# Patient Record
Sex: Male | Born: 1950 | Race: White | Hispanic: No | Marital: Married
Health system: Midwestern US, Academic
[De-identification: ages and names within clinical notes are randomized; demographics above are authoritative.]

---

## 2019-01-31 ENCOUNTER — Encounter: Admit: 2019-01-31 | Discharge: 2019-01-31

## 2019-01-31 ENCOUNTER — Encounter
Admit: 2019-01-31 | Discharge: 2019-02-04 | Disposition: A | Discharge status: Rehabilitation Facility | Source: Other Acute Inpatient Hospital | Attending: Neurology | Admitting: Neurology

## 2019-01-31 DIAGNOSIS — G459 Transient cerebral ischemic attack, unspecified: Principal | ICD-10-CM

## 2019-01-31 MED ORDER — CLOPIDOGREL 75 MG PO TAB
75 mg | Freq: Every day | ORAL | 0 refills | Status: DC
Start: 2019-01-31 — End: 2019-02-04
  Administered 2019-02-01 – 2019-02-04 (×5): 75 mg via ORAL

## 2019-01-31 MED ORDER — INSULIN ASPART 100 UNIT/ML SC FLEXPEN
0-6 [IU] | Freq: Before meals | SUBCUTANEOUS | 0 refills | Status: DC
Start: 2019-01-31 — End: 2019-02-04
  Administered 2019-02-01: 18:00:00 2 [IU] via SUBCUTANEOUS

## 2019-01-31 MED ORDER — INSULIN GLARGINE 100 UNIT/ML (3 ML) SC INJ PEN
10 [IU] | Freq: Every evening | SUBCUTANEOUS | 0 refills | Status: DC
Start: 2019-01-31 — End: 2019-02-04
  Administered 2019-02-01: 03:00:00 10 [IU] via SUBCUTANEOUS

## 2019-01-31 MED ORDER — TAMSULOSIN 0.4 MG PO CAP
0.4 mg | Freq: Every day | ORAL | 0 refills | Status: DC
Start: 2019-01-31 — End: 2019-02-01

## 2019-01-31 MED ORDER — ATORVASTATIN 40 MG PO TAB
80 mg | Freq: Every evening | ORAL | 0 refills | Status: DC
Start: 2019-01-31 — End: 2019-02-04
  Administered 2019-02-01 – 2019-02-04 (×4): 80 mg via ORAL

## 2019-01-31 MED ORDER — ASPIRIN 81 MG PO CHEW
81 mg | Freq: Every day | ORAL | 0 refills | Status: DC
Start: 2019-01-31 — End: 2019-02-04
  Administered 2019-02-01 – 2019-02-04 (×4): 81 mg via ORAL

## 2019-01-31 MED ORDER — HEPARIN, PORCINE (PF) 5,000 UNIT/0.5 ML IJ SYRG
5000 [IU] | SUBCUTANEOUS | 0 refills | Status: DC
Start: 2019-01-31 — End: 2019-02-04
  Administered 2019-02-01 – 2019-02-04 (×11): 5000 [IU] via SUBCUTANEOUS

## 2019-01-31 MED ORDER — CITALOPRAM 20 MG PO TAB
20 mg | Freq: Every day | ORAL | 0 refills | Status: DC
Start: 2019-01-31 — End: 2019-02-04
  Administered 2019-02-01 – 2019-02-04 (×4): 20 mg via ORAL

## 2019-01-31 MED ORDER — PANTOPRAZOLE 20 MG PO TBEC
20 mg | Freq: Every day | ORAL | 0 refills | Status: DC
Start: 2019-01-31 — End: 2019-02-04
  Administered 2019-02-01 – 2019-02-04 (×4): 20 mg via ORAL

## 2019-01-31 NOTE — Progress Notes
Patient arrived to room # [id number]) via bed accompanied by transport. Patient transferred to the bed with assistance. Bedside safety checks completed. Initial patient assessment completed. Refer to flowsheet for details.    Admission skin assessment completed with: Delfino Lovett, RN    Pressure injury present on arrival?: Yes    1. Head/Face/Neck: No  2. Trunk/Back: No  3. Upper Extremities: No  4. Lower Extremities: Yes  5. Pelvic/Coccyx: No  6. Assessed for device associated injury? Yes  7. Malnutrition Screening Tool (Nursing Nutrition Assessment) Completed? Yes    See Doc Flowsheet for additional wound details.     INTERVENTIONS: Pt has possibly two stage II's on R heel and R malleolus, skin abrasion R elbow area, and bruises all over body. Will consult wound team.

## 2019-02-01 ENCOUNTER — Encounter: Admit: 2019-02-01 | Discharge: 2019-02-01 | Attending: Neurology | Admitting: Neurology

## 2019-02-01 ENCOUNTER — Encounter: Admit: 2019-02-01 | Discharge: 2019-02-01

## 2019-02-01 DIAGNOSIS — G459 Transient cerebral ischemic attack, unspecified: Principal | ICD-10-CM

## 2019-02-01 LAB — POC GLUCOSE
Lab: 81 mg/dL — ABNORMAL HIGH (ref 70–100)
Lab: 176 mg/dL — ABNORMAL HIGH (ref 70–100)
Lab: 255 mg/dL — ABNORMAL HIGH (ref 70–100)
Lab: 225 mg/dL — ABNORMAL HIGH (ref 70–100)

## 2019-02-01 LAB — CBC
Lab: 12 g/dL — ABNORMAL LOW (ref 13.5–16.5)
Lab: 121 10*3/uL — ABNORMAL LOW (ref 150–400)
Lab: 15 % — ABNORMAL HIGH (ref 11–15)
Lab: 3.9 M/UL — ABNORMAL LOW (ref 4.4–5.5)
Lab: 30 pg (ref 26–34)
Lab: 34 g/dL (ref 32.0–36.0)
Lab: 35 % — ABNORMAL LOW (ref 40–50)
Lab: 4.1 M/UL — ABNORMAL LOW (ref 4.4–5.5)
Lab: 5.1 10*3/uL (ref 4.5–11.0)
Lab: 5.6 10*3/uL (ref 4.5–11.0)
Lab: 8.6 FL (ref 7–11)
Lab: 89 FL (ref 80–100)

## 2019-02-01 LAB — LIPID PROFILE
Lab: 124 mg/dL — ABNORMAL HIGH (ref <100)
Lab: 133 mg/dL (ref 7–11)
Lab: 14 mg/dL — ABNORMAL LOW (ref >60)
Lab: 193 mg/dL (ref <200)
Lab: 60 mg/dL (ref >40)
Lab: 70 mg/dL (ref <150)

## 2019-02-01 LAB — BASIC METABOLIC PANEL
Lab: 0.7 mg/dL (ref 0.4–1.24)
Lab: 107 MMOL/L (ref 98–110)
Lab: 141 MMOL/L (ref 137–147)
Lab: 17 mg/dL (ref 7–25)
Lab: 25 MMOL/L (ref 21–30)
Lab: 3.7 MMOL/L (ref 3.5–5.1)
Lab: 8.8 mg/dL (ref 8.5–10.6)
Lab: 83 mg/dL (ref 70–100)
Lab: >60 mL/min (ref >60)
Lab: >60 mL/min (ref >60)
Lab: 9 (ref 3–12)
Lab: 136 MMOL/L — ABNORMAL LOW (ref 137–147)
Lab: 3.8 MMOL/L (ref 3.5–5.1)

## 2019-02-01 LAB — COVID-19 (SARS-COV-2) PCR

## 2019-02-01 LAB — VITAMIN B12: Lab: 104 pg/mL — ABNORMAL HIGH (ref 180–914)

## 2019-02-01 LAB — HEMOGLOBIN A1C: Lab: 6.8 % — ABNORMAL HIGH (ref 4.0–6.0)

## 2019-02-01 MED ORDER — PERFLUTREN LIPID MICROSPHERES 1.1 MG/ML IV SUSP
1-20 mL | Freq: Once | INTRAVENOUS | 0 refills | Status: CP | PRN
Start: 2019-02-01 — End: ?
  Administered 2019-02-01: 13:00:00 2 mL via INTRAVENOUS

## 2019-02-01 MED ORDER — VITS A AND D-WHITE PET-LANOLIN TP OINT
Freq: Two times a day (BID) | TOPICAL | 0 refills | Status: DC
Start: 2019-02-01 — End: 2019-02-04
  Administered 2019-02-01: via TOPICAL

## 2019-02-01 MED ORDER — LACTATED RINGERS IV SOLP
1000 mL | Freq: Once | INTRAVENOUS | 0 refills | Status: CP
Start: 2019-02-01 — End: ?
  Administered 2019-02-01: 20:00:00 1000 mL via INTRAVENOUS

## 2019-02-01 MED ORDER — SODIUM CHLORIDE 0.9 % IJ SOLN
50 mL | Freq: Once | INTRAVENOUS | 0 refills | Status: CP
Start: 2019-02-01 — End: ?
  Administered 2019-02-01: 10:00:00 50 mL via INTRAVENOUS

## 2019-02-01 MED ORDER — LOPERAMIDE 2 MG PO CAP
2 mg | Freq: Every day | ORAL | 0 refills | Status: DC | PRN
Start: 2019-02-01 — End: 2019-02-04
  Administered 2019-02-01: 08:00:00 2 mg via ORAL

## 2019-02-01 MED ORDER — IOHEXOL 350 MG IODINE/ML IV SOLN
60 mL | Freq: Once | INTRAVENOUS | 0 refills | Status: CP
Start: 2019-02-01 — End: ?
  Administered 2019-02-01: 10:00:00 60 mL via INTRAVENOUS

## 2019-02-01 NOTE — Progress Notes
PHYSICAL THERAPY  ASSESSMENT      Name: Mitchell Lucero        MRN: [id number]          DOB: [birth year]          Age: 68 y.o.  Admission Date: 01/31/2019             LOS: 1 day      Mobility  Patient Turn/Position: Chair  Progressive Mobility Level: Walk in hallway  Distance Walked (feet): 150 ft(2 bouts of 75 feet apiece with one seated rest break)  Level of Assistance: Assist X1  Assistive Device: Walker(Plus chair follow)  Time Tolerated: 11-30 minutes  Activity Limited By: Weakness    Subjective  Significant hospital events: 68 y.o male who transferred to [facility] for a transient episode of dysarthria associated with right arm large amplitude irregular involuntary movement and both leg shaking with retained awareness. Pt has been having increasing number of falls at home. 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  Mental / Cognitive Status: Alert;Oriented;Cooperative  Persons Present: Spouse;Occupational Therapist  Pain: Patient has no complaint of pain  Pain Interventions: Patient agrees to participate in therapy  Ambulation Assist: Assist Needed with Mobility-Related ADL's/Ambulation  Patient Owned Equipment: Nurse, adult  Home Situation: Lives with Family(Spouse and mother)  Type of Home: House(Split-level)  Entry Stairs: Stair Glide  In-Home Stairs: Stair Glide  Comments: Patient has been receiving home health therapies at home prior to this admission. Experienced a pontine stroke in early June of this year, but did not go to inpatient setting afterwards and discharged home with home therapies. Since returning home, patient has been experiencing multiple falls per day. Spouse initially reports she can remember at least 13 falls, but later reports that the patient's mother says the patient seems to fall at least twice per day. Patient reports no warning signs to these falls (dizziness, LE shaking, etc).     ROM  ROM Position Assessed: Seated LE ROM: (Bilateral knee extension lacking 5-10 degrees)    Strength  Strength Position Assessed: Seated  Overall Strength: Able to Move All Joints Independently Through Available ROM;Generalized Weakness    Posture/Neurological  Head Control: Independent  Posture: Rounded Shoulders  Overall Tone: Normal  Coordination: No Deficits Noted  Posture/Neuro Comments: Patient reports history of peripheral neuropathy in his feet, but otherwise endorses no numbness/tingling in UEs/LEs    Bed Mobility/Transfer  Bed Mobility: Supine to Sit: Minimal Assist;Head of Bed Elevated;Assist with Trunk  Transfer Type: Sit to Stand  Transfer: Assistance Level: From;Bed;Minimal Assist;of 1st person  Transfer: Assistive Device: Nurse, adult  Transfers: Type Of Assistance: For Safety Considerations;For Strength Deficit;For Balance;Verbal Cues  Other Transfer Type: Sit to/from Stand  Other Transfer: Assistance Level: To/From;Bed Side Chair;Minimal Assist  Other Transfer: Assistive Device: Nurse, adult  Other Transfer: Type Of Assistance: For Safety Considerations;For Strength Deficit;For Balance;Verbal Cues  End Of Activity Status: Up in Chair;Nursing Notified;Instructed Patient to Request Assist with Mobility;Instructed Patient to Use Call Light    Gait  Gait Distance: 150 feet(2 bouts of 75 feet apiece)  Gait: Assistance Level: Minimal Assist;of 1st person(Chair follow of 2nd person)  Gait: Assistive Device: Roller Walker  Gait: Descriptors: Variable step length;No balance loss  Activity Limited By: Weakness    Education  Persons Educated: Patient/Family  Patient Barriers To Learning: None Noted  Interventions: Repetition of Instructions;Demonstration Provided  Teaching Methods: Verbal Instruction;Demonstration  Patient Response: Verbalized Understanding;More Instruction  Required  Topics: Plan/Goals of PT Interventions;Use of Assistive Device/Orthosis;Mobility Progression;Safety Awareness;Up with Assist Only;Importance of Increasing Activity;Recommend Continued Therapy    Assessment/Progress  Impaired Mobility Due To: Decreased Strength;Impaired Balance;Decreased Activity Tolerance;Safety Concerns  Assessment/Progress: Should Improve w/ Continued PT    AM-PAC 6 Clicks Basic Mobility Inpatient  Turning from your back to your side while in a flat bed without using bed rails: A Little  Moving from lying on your back to sitting on the side of a flatbed without using bedrails : A Little  Moving to and from a bed to a chair (including a wheelchair): A Little  Standing up from a chair using your arms (e.g. wheelchair, or bedside chair): A Little  To walk in hospital room: A Little  Climbing 3-5 steps with a railing: Total  Raw Score: 16  Standardized (T-scale) Score: 38.32  Basic Mobility CMS 0-100%: 47.12  CMS G Code Modifier for Basic Mobility: CK    Goals  Goal Formulation: With Patient/Family  Time For Goal Achievement: 5 days, To, 7 days  Patient Will Go Supine To/From Sit: w/ Stand By Assist  Patient Will Transfer Bed/Chair: w/ Stand By Assist  Patient Will Transfer Sit to Stand: w/ Stand By Assist  Patient Will Ambulate: 51-100 Feet, w/ Dan Humphreys, w/ Stand By Assist    Plan  Treatment Interventions: Mobility Training;Strengthening  Plan Frequency: 5 Days per Week  PT Plan for Next Visit: Progress proximal LE strengthening and consider 5x sit<>stand as standardized outcome for assessing. Progress gait distance with roller walker as able.     PT Discharge Recommendations  Recommendation: Inpatient setting;Recommend rehab medicine consult  Patient Currently Requires Physical Assist With: All mobility    Therapist  Edward Jolly, PT, DOT [phone]  Date  02/01/2019

## 2019-02-01 NOTE — Progress Notes
OCCUPATIONAL THERAPY  ASSESSMENT NOTE      Name: Mitchell Lucero        MRN: [id number]          DOB: 1951/02/01          Age: 68 y.o.  Admission Date: 01/31/2019             LOS: 1 day      Mobility  Patient Turn/Position: Chair  Progressive Mobility Level: Walk in hallway  Distance Walked (feet): 150 ft  Level of Assistance: Assist X1  Assistive Device: Walker(Chair follow)  Time Tolerated: 11-30 minutes  Activity Limited By: Fatigue;Weakness    Subjective  Pertinent Dx per Physician: 68 y.o male who transferred to [facility] for a transient episode of dysarthria associated with right arm large amplitude irregular involuntary movement and both leg shaking with retained awareness. Pt has been having increasing number of falls at home. 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  Precautions: Falls  Pain / Complaints: Patient agrees to participate in therapy;Patient has no c/o pain    Objective  Psychosocial Status: Willing and Cooperative to Participate  Persons Present: Spouse;Physical Therapist    Home Living  Type of Home: House(Split Level)  Home Layout: Two Level;Bed/Bath IT sales professional / Tub: Pension scheme manager: Standard  Bathroom Equipment: Engineer, materials in Air traffic controller;Shower Chair  Home Equipment: Walker(2 - one upstairs w/ tray & one downstairs;Stair glide)    Prior Function  Level Of Independence: Independent with ADLs and functional transfers  Lives With: Spouse;Mother  Receives Help From: Family    ADL's  Where Assessed: Edge of Bed;Chair  LE Dressing Assist: Maximum Assist  LE Dressing Deficits: Don/Doff R Sock;Don/Doff L Sock    ADL Mobility  Bed Mobility: Supine to Sit: Minimal assist  Bed Mobility Comments: HOB elevated;bed rail;hand hold assist  Transfer Type: Sit to/from stand  Transfer: Assistance Level: Minimal assist;of 1st person;Standby assist;of 2nd person;From;Bed  Transfer: Assistive Device: Roller walker Transfer: Type of Assistance: For safety considerations;For balance;For strength deficit  End of Activity Status: Up in chair;Instructed patient to request assist with mobility(TABS alarm on)  Gait Distance: 150 feet  Gait: Assistance Level: Minimal assist  Gait: Assistive Device: Roller walker  Gait Comments: Chair follow for safety during ambulation due to frequent falls.    Activity Tolerance  Endurance: 3/5 Tolerates 25-30 Minutes Exercise w/Multiple Rests    Cognition  Overall Cognitive Status: Impaired  Expression: Increased Time for Expression;Mumbling/Slurred(Per spouse, he is at his new baseline since prior stroke)  Social Interaction: Flat affect  Problem Solving: Decreased Judgment/Safety  Orientation: Alert & Oriented x4  Attention: Awake/Alert    UE AROM  Overall BUE AROM WNL: Yes  Grasp: Bilateral Grasp Functional for Activity    UE Strength / Tone  Overall Strength / Tone: WNL BUE Strength 5/5    Education  Persons Educated: Patient/Family  Teaching Methods: Verbal Instruction  Patient Response: Verbalized Understanding  Topics: Role of OT, Goals for Therapy  Goal Formulation: With Patient    Assessment  Assessment: Decreased ADL Status;Decreased Safe/Judg during ADL;Decreased Cognition;Decreased Endurance;Decreased Self-Care Trans;Decreased High-Level ADLs  Prognosis: Good;w/Cont OT s/p Acute Discharge    Plan  OT Frequency: 5x/week  OT Plan for Next Visit: Standing at sink for grooming; LE dressing at EOB    ADL Goals  Patient Will Perform All ADL's: w/ Stand By Assist;w/Adaptive Equipment;w/ Good Judgment/Safety    Functional Transfer  Goals  Pt Will Perform All Functional Transfers: w/ Stand By Assist, w/ Assistive Devices, w/ Good Judgment/Safety    OT Discharge Recommendations  Recommendation: Inpatient setting;Recommend rehab medicine consult  Patient Currently Requires Physical Assist With: All mobility;All personal care ADLs    Therapist: Lilian Coma, OTR/L [id number]  Date: 02/01/2019

## 2019-02-01 NOTE — H&P (View-Only)
Neurology Admission History and Physical      Name:  Mitchell Lucero                                             MRN:  [id number]   Admission Date:  01/31/2019  LOS: 0 days  CA6107/01                 ASSESSMENT:    Principal Problem:    TIA (transient ischemic attack)      Mitchell Lucero is a 68 y.o. R handed male with PMH of HTN, DM2, C6-C7 ADCF in 1997, and recent left pontine stroke in 12/18/2018 who was transferred to [facility] for a transient episode of dysarthria for 1.5 hrs in the morning of 01/31/2019 associated with right arm large amplitude irregular involuntary movement and both leg shaking with retained awareness. He has been having increasing number of falls since he was discharged from rehab, primarily at night when its dark, even with walker. Unintentional weight loss of 30 lbs over the past year.    Neuro exam: AAO x3 not fully to time, Impaired repetition, No tongue atrophy/fasciculation/weakness, SA 4/4, EF and EE 4/5, FF/FE/FA/TA 3-4/4, with visible FDI atrophy bilaterally with fasciculation in the right FDI; diffusely hyperreflexic with jaw reflex, right hoffman, left grasp reflex, bilateral babinski signs, slow finger tapping bilaterally and clumsy heel shin on the left leg. Unable to walk for me; stood up 10 seconds with help, wide based, and immediately went back to bed.    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  - needs current and previous MRIs   Plan  - Admit to neurology on telemetry   - obtain records and imaging from [facility]  - MRI head and C-spine wo  - CTA head/neck  - repeat orthostatic vitals - Stroke risk factor assessment                Labs to include FLP, A1c               Echocardiogram                Vascular Imaging CTA head/neck               Telemetry to monitor for arrhythmia                Antiplt therapy: continue PTA aspirin and plavix   - CBC, BMP routine   - If above imaging is unrevealing, consider EMG/NCS and movement disorder consult  - PT/OT/SLP consult eval and treat   - Rehab consult    Increased number of falls with intermittent LOC without following confusion  - will monitor on tele, consider cardiac monitor at disciharge  - echo cardiogram  - PT/OT, rehab consult    Burning pain and hypersensitivity in soles bilaterally  Decreased proprioception on the right toe  Decreased vibratory sense on the left toe  - HbA1c, IFES, vitamin B12    Orthostatic hypotension  at OSH  - recheck orthostatic vitals  - hold Lisinopril and tamsulosin    DM2   - PTA Levemir 10 unites QHS and metformin 500 mg BID  > hold metformin, lantus 10 units QHS, LDCF    FEN: No IVF, replete as needed, regular diet  VTE ppx: heparin SQ  CODE: FULL CODE    Dispo: Admit to Neurology for workup    Patient seen and plan of care discussed with Dr. Georgina Pillion, and to be seen by the day team.  __________________________________________________________________________________    History of Present Illness:   Mitchell Lucero is a 68 y.o. R handed male with PMH of HTN, DM2, C6-C7 ADCF in 1997, and recent left pontine stroke in 12/18/2018 who was transferred to [facility] for a transient episode of dysarthria for 1.5 hrs in the morning of 01/31/2019 associated with right arm large amplitude irregular involuntary movement and both leg shaking with retained awareness.    This morning, he was witnessed to walk normally to the bathroom, and got out, and next time his wife saw him he was barely sitting at the edge of the couch, with slurred speech and large amplitude irregular involuntary movement with the right arm and shakiness in both legs.  He retained awareness during this.  Slurred speech lasted about 1.5 hour.  He had similar episodes on 7/17 night.  Blood sugar was 130 and blood pressure was 106/50s.  He had a pontine stroke in June 6 that was treated at [facility], [facility].  He had dysarthria with this stroke 2.  After he was discharged from a rehab, he had more than 13 falls even using the walker, primarily at night.  He does not note morning sensation, however he notes intermittent loss of consciousness with the fall with spontaneous recovery of awareness after he is down.  There is no confusion after loss of consciousness.  It is not always right after standing up, there is no warning signs such as chest pain or racing heart or shortness of breath.  Recently, he now has a urinal at bedside to avoid walking to the bathroom at night.    He has longstanding tremors when he tries to eat or do something with his hands, seen by 2 different neurologist and was told that low concern for Parkinson's disease.  He does not have resting tremor.    Unintentional weight loss of 30 lbs over the past year.  He has remote history of cervical spine surgery in 1992 with residual RUE weakness.  History was primarily obtained by his wife and it appeared that patient himself needed some help to recall some details.    No complaints of fever, chills, headache, vision changes, chest pain, dyspnea, muscle weakness, sensory deficits, nausea, vomiting.    No past medical history on file. HTN, DM2  No past surgical history on file. ADCF C6-C7 in 1997  No smoking, occasional drink, no recreational drung  Social History     Socioeconomic History   ??? Marital status: Not on file     Spouse name: Not on file   ??? Number of children: Not on file   ??? Years of education: Not on file   ??? Highest education level: Not on file   Occupational History   ??? Not on file   Tobacco Use   ??? Smoking status: Not on file Substance and Sexual Activity   ??? Alcohol use: Not on file   ??? Drug use: Not on file   ???  Sexual activity: Not on file   Other Topics Concern   ??? Not on file   Social History Narrative   ??? Not on file      No family history on file. - family history reviewed, noncontributory      Immunizations (includes history and patient reported):   There is no immunization history on file for this patient.        Allergies:  Patient has no allergy information on record.    Medications:  No medications prior to admission.     No current facility-administered medications on file prior to encounter.      No current outpatient medications on file prior to encounter.       Review of Systems:  Constitutional: negative   Eyes: negative   Ears, nose, mouth, throat, and face: negative   Respiratory: negative   Cardiovascular: negative   Gastrointestinal: negative   Genitourinary: negative   Integument/breast: negative   Hematologic/lymphatic: negative   Musculoskeletal: negative   Neurological: negative   Behavioral/Psych: negative   Endocrine: negative   Allergic/Immunologic: negative    Physical Exam:  Vital Signs: Last Filed In 24 Hours Vital Signs: 24 Hour Range   BP: 124/71 (07/20 2153)  Temp: 36.6 ???C (97.9 ???F) (07/20 2153)  Pulse: 73 (07/20 2153)  Respirations: 15 PER MINUTE (07/20 2153)  SpO2: 97 % (07/20 2153)  Height: 160 cm (63) (07/20 1818) BP: (112-124)/(48-71)   Temp:  [36.6 ???C (97.9 ???F)-36.8 ???C (98.2 ???F)]   Pulse:  [73-78]   Respirations:  [15 PER MINUTE-16 PER MINUTE]   SpO2:  [97 %-98 %]           HEENT: normocephalic, eyes open with no discharge, nares patent, oropharynx is clear with no lesions, palate intact, no bruits  Chest: normal configuration, non labored breathing, chest rise equal b/l   CV: normal rate and regular rhythm, no murmur, distal pulses palpable  Pulmonary: lungs are clear bilaterally, no wheezes/crackles/rales  Abd: soft, non- tender, no masses, no organomegaly  Skin: no rashes or lesions Psych: normal     Neuro:  Mental status: Oriented to person/place, not fully to time (month and year, not to the date, correct day of the week  Memory: Recent and remote memory intact  Attention: Good span, follows conversation.  Fund of knowledge: Appropriate  Level of consciousness: Alert    Speech:    Fluency: Normal   Comprehension: Normal   Articulation: Normal   Repetition: impaired; able to repeat today is a bright sunny day, but not no ifs, ands, or buts, repetitively saying no ifs and buts.   Naming: Normal    CN II-XII: Visual fields intact in all fields, PERRL, EOMI, facial sensation intact. Symmetrical facial movement. Hearing grossly intact to conversation. Strong cough, elevates palate b/l, uvula midline. Strong shoulder shrug. Tongue midline.    Motor: Increased tone in LUE. R FDI fasciculation, b/l FDI atrophy   NF NE SA  EF  EE  WE  WF  FF  FE  FA  TA  HF  HA  HE  KF  KE  DF  PF  In  Ev  TF  TE    R     4 4 4 5  5  4 4 4 3 4   5  5  5  5         L    4 5  5  5  5  4 4 4 3  4  5  5  5  5           Sensory:   Light touch: intact, without sensory level   Pin prick: Diminished in R foot   Vibration (tuning fork 128 Hz):GT 12 sec in the right, 8 sec on the left   Proprioception: not intact in the right toe    Reflexes: No clonus, hofmann +/-. Babinski bilaterally upgoing. Palmomental (+) in the right hand. Jaw jerk+.   Right Left   Triceps 2+ 2+   Biceps 2+ 2+   Brachioradialis 2+ 2+   Patella 2+ 2+   Ankle 2 2     Cerebellar Funciton/fine movement: normal finger to nose, clumsy left heel shin, bradykinesia with finger tapping bilatearlly.  Gait: Only able to stand up assisted, needed to go back to bed after 10 seconds, unable to stand up feet together    Lab/Radiology/Other Diagnostic Tests:  24-hour labs:    Results for orders placed or performed during the hospital encounter of 01/31/19 (from the past 24 hour(s))   CBC    Collection Time: 01/31/19  6:50 PM   Result Value Ref Range White Blood Cells 5.6 4.5 - 11.0 K/UL    RBC 3.99 (L) 4.4 - 5.5 M/UL    Hemoglobin 12.1 (L) 13.5 - 16.5 GM/DL    Hematocrit 16.1 (L) 40 - 50 %    MCV 89.0 80 - 100 FL    MCH 30.3 26 - 34 PG    MCHC 34.0 32.0 - 36.0 G/DL    RDW 09.6 (H) 11 - 15 %    Platelet Count 121 (L) 150 - 400 K/UL    MPV 8.6 7 - 11 FL          Pertinent radiology reviewed.    Para Skeans, MD  Neurology Resident  Pager [phone]

## 2019-02-01 NOTE — Consults
Wound Ostomy Note    NAME:Mitchell Lucero                                                                   MRN: [id number]                 DOB:[birth year]          AGE: 68 y.o.  ADMISSION DATE: 01/31/2019             DAYS ADMITTED: LOS: 1 day      Reason for Consult/Visit: pressure injury Stage II or greater and wound not pressure    Assessment/Plan:    Principal Problem:    TIA (transient ischemic attack)          Wounds (NOT for Pressure Injuries) 02/01/19 1030 Dorsal;Left Foot Laceration (Active)   02/01/19 1030 Foot   Wound Orientation: Dorsal;Left   Wound Type: Laceration   Wound Type::    Wound Description (Comments):    Wound Image   02/01/2019 10:30 AM   Wound Base Assessment Dry;Red 02/01/2019 10:30 AM   Surrounding Skin Assessment Dry;Intact 02/01/2019 10:30 AM   Wound Site Closure None 02/01/2019 10:30 AM   Wound Drainage Amount None 02/01/2019 10:30 AM   Wound Length (cm) 0.3 cm 02/01/2019 10:30 AM   Wound Width (cm) 1 cm 02/01/2019 10:30 AM   Wound Depth (cm) 0.1 cm 02/01/2019 10:30 AM   Wound Surface Area (cm^2) 0.3 cm^2 02/01/2019 10:30 AM   Wound Volume (cm^3) 0.03 cm^3 02/01/2019 10:30 AM   Number of days: 0     Pressure Injury 01/31/19 1844 Yes Posterior Ankle Stage 2 (Active)   01/31/19 1844    Pressure Injury Present On Inpatient Admission: Y   Pressure Injury Orientation: Posterior   Wound Location: Ankle   Pressure Injury Stages: Stage 2   If this pressure injury is suspected to be device related, please select the device::          Wound Dressing Status Open to Air 02/01/2019  8:17 AM   Wound Drainage Amount None 02/01/2019 10:30 AM   Wound Base Assessment Pink;Red 02/01/2019 10:30 AM   Surrounding Skin Assessment Dry;Intact 02/01/2019 10:30 AM   Wound Length (cm) 0.8 cm 02/01/2019 10:30 AM   Wound Width (cm) 0.8 cm 02/01/2019 10:30 AM   Wound Depth (cm) 0.1 cm 02/01/2019 10:30 AM   Wound Surface Area (cm^2) 0.64 cm^2 02/01/2019 10:30 AM   Wound Volume (cm^3) 0.06 cm^3 02/01/2019 10:30 AM Patient states lacerations on bilateral feet are from cat scratches. Left foot with Dry red scabbed wound base.     Right posterior ankle with dry red wound base.     RECOMMEND: ---Wound care and maintenance orders placed per skin integrity protocol.  If in agreement, primary team is responsible for placing any orders for consults, tests and/or procedures.---    Bilateral Feet:  Clean with soap and water. Dry.   Vit A&D BID    Wound Team will sign off. Please re-consult if skin breakdown worsens.     Bettey Mare RN, BSN  Wound/ Ostomy Nursing Consult Service  Office: [phone]  Pager: [phone]  Wound/ Ostomy Team pager (after hours/ weekends): [phone]

## 2019-02-01 NOTE — Progress Notes
Neurology Progress Note      Mitchell Lucero  Admission Date: 01/31/2019  LOS: 1 day     Assessment/Plan:  Principal Problem:    TIA (transient ischemic attack)    Mitchell Lucero is a 68 y.o. R handed male with PMH of HTN, DM2, C6-C7 ADCF in 1997, and recent left pontine stroke in 12/18/2018 who was transferred to [facility] for a transient episode of dysarthria for 1.5 hrs in the morning of 01/31/2019 associated with right arm large amplitude irregular involuntary movement and both leg shaking with retained awareness. He has been having increasing number of falls since he was discharged from rehab, primarily at night when its dark, even with walker. Unintentional weight loss of 30 lbs over the past year.  ???  Admission Neuro exam: AAO x3 not fully to time, Impaired repetition, No tongue atrophy/fasciculation/weakness, SA 4/4, EF and EE 4/5, FF/FE/FA/TA 3-4/4, with visible FDI atrophy bilaterally with fasciculation in the right FDI; diffusely hyperreflexic with jaw reflex, right hoffman, left grasp reflex, bilateral babinski signs, slow finger tapping bilaterally and clumsy heel shin on the left leg.    Recent pontine stroke with transient slurred speech  Transient episode of dysarthria and involuntary movement in the right arm, and both leg shaking.  Recurrent falls  -DDX:  Differential is broad at this point , his multiple exam findings could be explained by different Disease processes including his previous pontine stroke with involvement of  Middle cerebellar peduncle ( explains left sided Dysmetria and ataxia). Current Symptoms of Right arm Shaking with Dysarthria Likely represent TIA ( Limb shaking TIA ) given his extensive intracranial atherosclerosis . His symptoms usually happen when he stand up ( with decreased brain blood supply in the setting of orthostatsis). 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   - Degenerative Disorders is still in the differential given the orthostatic hypotension , gait issues and abnormal movements.  - MRI brain: with Signal abnormality without atrophy involving the left aspect of the  pons and left middle cerebellar peduncle corresponding with his recent pontine infarct.lack of significant atrophy somewhat goes against brainstem/cerebellar neurodegenerative diseases.  - MRI C- Spine: ???Prior ACDF at C6-C7 without high-grade central or neural foraminal   stenosis at the operated level.Multilevel central spinal stenosis, most pronounced of suspected at   least moderate degree at C4-C5.Multilevel degenerative neural foraminal narrowing.  - CTA brain Dense cavernous carotid calcification, ???Short segment occlusion or high-grade stenosis of the right P2 arterial segment, ???Dense atherosclerotic calcification within the V4 segment segments.  (moderate or high grade stenosis) , Mild stenosis of the left M1 origin.  - CTA neck : ???Mixed, predominantly soft plaque atherosclerosis of the right ICA   origin resulting in less than 50% stenosis by NASCET criteria. No significant left carotid stenosis by NASCET criteria.???Atherosclerotic calcification at the right vertebral artery origin resulting in at least mild stenosis.  - A1C 6.8 , Lipid Panel with LDL of 124 .   - 2-D echo with EF of 55% , Grade 1 LV diastolic dysfunction. Moderately Dilated Left atrium. Mild aortic stenosis. No evidence of intraatrial shunting.  Plan  - Optimization of risk factors for secondary prevention of stroke:   > Continue DAPT Aspirin  81 mg daily , Plavix 75 mg daily.   > Continue High intensity statin Atorvastatin 80 mg QHS . Goal LDL < 70.   > Control orthostatic hypotension to prevent  cerebral hypoperfusion in the setting if intracranial atherosclerosis.  - Will obtain EEG to rule out seizures. - Follow up with movement disorder on discharge to rule out Neurodegenerative process like MSA.  - Continue to monitor on Tele.    Orthostatic Hypotension   - Patient reported Dizziness on standing.  - Orthostatic vital signs positive at the OSH . Orthostatic vital signs repeated on admission to [facility] and was again positive.  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 Other Possibility include Neurodegenerative disorders (like MSA).  Plan  - Hold PTA Lisinopril and Flomax.  - IVF fluids  - Will re- check orthostatic vital signs and consider Midodrine if continued to be low.  - Compression stockings.      Burning pain and hypersensitivity in soles bilaterally  Dimisnhed vibration over bilateral lower extremities.  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  Plan  - HbA1c, IFES, vitamin B12    ???  Orthostatic hypotension at OSH  - recheck orthostatic vitals  - hold Lisinopril and tamsulosin  ???  DM2   - PTA Levemir 10 unites QHS and metformin 500 mg BID  > hold metformin, lantus 10 units QHS, LDCF  ???  FEN: LR, replete as needed, Cardiac diet.  VTE ppx: heparin SQ  Code : Full code.      Patient was seen and discussed with Dr. Consuella Lose.     , MBChB.  PGY-2  Pager:[phone]  ________________________________________________________________________    Subjective:    Mitchell Lucero is a 68 y.o. male. No further events noted . Patient had dysarthria after admission yesterday with associated Right arm movements that lasted around 20 minutes . Wife describes the movements as circular , non rhythmic , associated with Dysarthria with no impaired awarness    Objective:                    Vital Signs:  Last Filed                   Vital Signs: 24 Hour Range   BP: 141/80 (07/21 1300)  Temp: 36.8 ???C (98.2 ???F) (07/21 1300)  Pulse: 91 (07/21 1300)  Respirations: 15 PER MINUTE (07/21 1300)  SpO2: 98 % (07/21 1300)  Height: 160 cm (63) (07/21 0742)  BP: (107-155)/(48-88) Temp:  [36.6 ???C (97.9 ???F)-36.8 ???C (98.2 ???F)]   Pulse:  [73-106]   Respirations:  [15 PER MINUTE-16 PER MINUTE]   SpO2:  [97 %-98 %]     Intensity Pain Scale (Self Report): (not recorded)    Intake/Output Summary: (Last 24 hours)    Intake/Output Summary (Last 24 hours) at 02/01/2019 1412  Last data filed at 02/01/2019 0817  Gross per 24 hour   Intake 250 ml   Output 100 ml   Net 150 ml      Stool Occurrence: 1    Medications:  Scheduled Meds:aspirin chewable tablet 81 mg, 81 mg, Oral, QDAY  atorvastatin (LIPITOR) tablet 80 mg, 80 mg, Oral, QHS  citalopram (CeleXA) tablet 20 mg, 20 mg, Oral, QDAY  clopiDOGrel (PLAVIX) tablet 75 mg, 75 mg, Oral, QDAY  heparin (porcine) PF syringe 5,000 Units, 5,000 Units, Subcutaneous, Q8H  insulin aspart U-100 (NOVOLOG FLEXPEN) injection PEN 0-6 Units, 0-6 Units, Subcutaneous, ACHS (22)  insulin glargine (LANTUS SOLOSTAR) injection PEN 10 Units, 10 Units, Subcutaneous, QHS  lactated ringers infusion, 1,000 mL, Intravenous, ONCE  pantoprazole DR (PROTONIX) tablet 20 mg, 20 mg, Oral, QDAY(21)  vitamin A & D topical ointment, ,  Topical, BID    Continuous Infusions:  PRN and Respiratory Meds:loperamide QDAY PRN       General physical exam:    HEENT: normocephalic, eyes open with no discharge, nares patent, oropharynx is clear with no lesions, palate intact  Carotids: No bruits  CV: regular rate and rhythm, no murmur, distal pulses palpable  Chest: normal configuration, lungs are clear bilaterally  Ab: soft, non-tender, no masses, no organomegaly  Skin: no rashes or lesions    Neuro exam:   Mental status: alert, oriented to person/place/time ( was unable to remeber the exact day but remembered July and the year)  Speech:    Normal Abnormal   Fluency x    Comprehension x    Articulation x    Repetition x    Naming x        Cranial Nerves:    Normal Abnormal   II Pupils reactive, visual fields normal    III, IV, VI EOMI, no nystagmus    V Sensation nml V1-V3    VII Orbicularis Oculi: 5 VIII nml to finger rub    IX, X +strong cough    XI Equal shoulder shrug    XII Tongue midline        Muscle/motor:   Tone: increased tone over the LUE.  Bulk: FDI atrophy bilaterally.  Fasciculations: FDI fasciculations bilaterally.  Pronator drift: none     NF NE SA EF EE WE WF FF FE FA TA HF HE KF KE DF PF   R   4+ 4+ 4 5 5 4 4 4 3 4  4+ 5 5 5 5    L   4 5 4+ 5 5 4 4 4 3 4 4 5 5 5 5        Sensation:                Light touch: diminished over bilateral lower extremeties up to the                Pin prick: Diminished up to the level of bilateral knees.               Vibration Diminshed over bilateral toes.               Proprioception: intact joint position sense over Bilateral lower extremieties    Coordination:    Normal Abnormal Right Abnormal Left   Finger to Nose   x   Rapid alternating   slow slow   Heel to Shin   Clumsy on the left side   Finger tap  slow slow   Foot tap      Other        Gait and Sation:  Deferred.  Reflexes:    Right Left   Triceps 2+ 3   Biceps 2+ 3   Brachioradialis 2+ 3   Patella 2+ 3   Ankle 1 1   Plantar up up       Other reflexes:  Jaw: Brisk   Hoffman: present on the right side.      Laboratory Review:     No results found for: PHART, PCO2A, PO2ART, HCO3A, BASEEXA, BASEDEFA, O2SATACAL  Lab Results   Component Value Date    HGB 13.1 (L) 02/01/2019    HCT 37.6 (L) 02/01/2019    PLTCT 108 (L) 02/01/2019    WBC 5.1 02/01/2019     Lab Results   Component Value Date    NA 136 (L) 02/01/2019  K 3.8 02/01/2019    CL 103 02/01/2019    CO2 25 02/01/2019    GAP 8 02/01/2019    BUN 16 02/01/2019    CR 0.75 02/01/2019    GLU 187 (H) 02/01/2019    CA 9.1 02/01/2019     No results found for: TNI, CKMB, MYOGLB  No results found for: PTT, INR, FIB, DIMER, FDP  No results found for: TSH, Oren Bracket  Lab Results   Component Value Date    CHOL 193 02/01/2019    TRIG 70 02/01/2019    HDL 60 02/01/2019    LDL 124 (H) 02/01/2019    VLDL 14 02/01/2019     No results found for: VANRAN, VANPK, VANTR No results found for: CYCLOSPOR, TACROLIMUS, FREEPHENY     Point of Care Testing:  (Last 24 hours):  Glucose: (!) 187  POC Glucose (Download): (!) 255    Radiology and Other Diagnostics Review: review    Jill Alexanders, MBChB   Neurology Resident

## 2019-02-01 NOTE — Rehab Pre-Admission Screening
Physical Medicine & Rehabilitation Pre-Admission Screening       Mitchell Lucero is a 68 y.o.  male.     DOB: 07-20-1950                  MRN#:  [id number]  Primary Insurance: Monia Pouch MEDICARE  Financial Class: Medicare Repl  Date of Hospital Admission:  01-31-19  Date of Expected Rehab Admission:  02-02-19  Precautions:  Fall  Weight bearing Precautions:  [facility] Course:       Mitchell Lucero is a 68 y.o. male with a past medical history of hypertension, diabetes, CA6-7 ADCF in 1997 and recent stroke in June 2020. Patient presented as a transfer to [facility] for transient episode of dysarthria associated with right arm large amplitude, irregular involuntary movement in both legs shaking yet retained awareness. Patient has been previously having some increasing number of falls at home. Patients hospital course has been complicated by anemia, thrombocytopenia, hyponatremia, as well as impaired mobility and activities of daily living.   ???  Patient has been working with physical and occupational therapy since 02-01-19 and has been making functional gains. Patient is anticipated to return to home at the modified independent level of care. Patient will also receive formal cognitive evaluation to assess for baseline status and any new cognitive/linguistic deficits.           Prior Level of Function         Self-Care/ADLs: Independent with ADLs and functional transfers  Mobility: Assist Needed with Mobility-Related ADL's/Ambulation  Work/Personal Responsibilities/Hobbies:  n/a    Home Environment:  Home Situation: Lives with Family (Spouse and mother) (02/01/2019 10:00 AM)   Patient Owned Equipment: Bernardo Heater (02/01/2019 10:00 AM)   Type of Home: House (Split-level) (02/01/2019 10:00 AM)   Entry Stairs: Stair Glide (02/01/2019 10:00 AM)   In-Home Stairs: Stair Glide (02/01/2019 10:00 AM) Kriste Basque Equipment: Binnie Rail in Air traffic controller;Shower Chair (02/01/2019  9:00 AM)    Support System:  Lives with spouse and mother          Current Level of Function     Physical Therapy: 02-01-19  ROM  ROM Position Assessed: Seated  LE ROM: (Bilateral knee extension lacking 5-10 degrees)  ???  Strength  Strength Position Assessed: Seated  Overall Strength: Able to Move All Joints Independently Through Available ROM;Generalized Weakness  ???  Posture/Neurological  Head Control: Independent  Posture: Rounded Shoulders  Overall Tone: Normal  Coordination: No Deficits Noted  Posture/Neuro Comments: Patient reports history of peripheral neuropathy in his feet, but otherwise endorses no numbness/tingling in UEs/LEs  ???  Bed Mobility/Transfer  Bed Mobility: Supine to Sit: Minimal Assist;Head of Bed Elevated;Assist with Trunk  Transfer Type: Sit to Stand  Transfer: Assistance Level: From;Bed;Minimal Assist;of 1st person  Transfer: Assistive Device: Nurse, adult  Transfers: Type Of Assistance: For Safety Considerations;For Strength Deficit;For Balance;Verbal Cues  Other Transfer Type: Sit to/from Stand  Other Transfer: Assistance Level: To/From;Bed Side Chair;Minimal Assist  Other Transfer: Assistive Device: Nurse, adult  Other Transfer: Type Of Assistance: For Safety Considerations;For Strength Deficit;For Balance;Verbal Cues  End Of Activity Status: Up in Chair;Nursing Notified;Instructed Patient to Request Assist with Mobility;Instructed Patient to Use Call Light  ???  Gait  Gait Distance: 150 feet(2 bouts of 75 feet apiece)  Gait: Assistance Level: Minimal Assist;of 1st person(Chair follow of 2nd person)  Gait: Assistive Device: Nurse, adult  Gait: Descriptors: Variable  step length;No balance loss  Activity Limited By: Weakness    Occupational Therapy: 02-01-19  ADL's  Where Assessed: Edge of Bed;Chair  LE Dressing Assist: Maximum Assist  LE Dressing Deficits: Don/Doff R Sock;Don/Doff L Sock  ???  ADL Mobility Bed Mobility: Supine to Sit: Minimal assist  Bed Mobility Comments: HOB elevated;bed rail;hand hold assist  Transfer Type: Sit to/from stand  Transfer: Assistance Level: Minimal assist;of 1st person;Standby assist;of 2nd person;From;Bed  Transfer: Assistive Device: Roller walker  Transfer: Type of Assistance: For safety considerations;For balance;For strength deficit  End of Activity Status: Up in chair;Instructed patient to request assist with mobility(TABS alarm on)  Gait Distance: 150 feet  Gait: Assistance Level: Minimal assist  Gait: Assistive Device: Roller walker  Gait Comments: Chair follow for safety during ambulation due to frequent falls.  ???  Activity Tolerance  Endurance: 3/5 Tolerates 25-30 Minutes Exercise w/Multiple Rests  ???  Cognition  Overall Cognitive Status: Impaired  Expression: Increased Time for Expression;Mumbling/Slurred(Per spouse, he is at his new baseline since prior stroke)  Social Interaction: Flat affect  Problem Solving: Decreased Judgment/Safety  Orientation: Alert & Oriented x4  Attention: Awake/Alert  ???  UE AROM  Overall BUE AROM WNL: Yes  Grasp: Bilateral Grasp Functional for Activity  ???  UE Strength / Tone  Overall Strength / Tone: WNL BUE Strength 5/5    Speech Therapy:  Patient will have a formal cognitive evaluation while on inpatient rehab.          Rehabilitation Plan     Rehab Diagnosis and conditions that require Rehabilitation:      stroke   Cognitive impairment and Weakness       Active Comorbidities/Risk of Medical Complications:     1. 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 Patient admitted for  transient episode of dysarthria associated with right arm large amplitude, irregular involuntary movement in both legs shaking yet retained awareness.  2. Cognitive Impairment: Patient with cognitive impairment.  May need 24 hour supervision upon discharge home. Patient will have a formal cognitive evaluation while on inpatient rehab. 3. Anemia: Will need to monitor and manage, as this may negatively impact patient's endurance with therapy.  4. Diabetes: Due to history of DM2 there is a risk of hypo/hyperglycemia during mobilization as we titrate new and home insulin and diabetes medications. Patient is at risk for hyper/hypoglycemia during aggressive mobilization with therapies, and will need daily physician monitoring and adjustment of medications.  Patient may benefit from ongoing education from diabetes nurse educator for healthy lifestyle modifications.  5. Falls:  Patient will be placed close to the nurse's station for closer monitoring. Staff will round on the patient frequently to assess for any needs, such as using the restroom to help prevent falls as this increases the patient's risk for morbidity/mortality and 30 day readmission to the hospital rate.   6. Hyperlipidemia: Will continue to monitor and manage, and adjust medications as needed.  7. Hyponatremia: Patient is at risk for worsening electrolyte imbalance and will require physician monitoring of labs and adjustments to medications and fluid intake.  Will continue to monitor and manage, as this may affect patient's cognitive status.  8. Risk for Thrombosis: Patient has Heparin, sequential compression devices ordered.  Patient will be encouraged to ambulate frequently with the assistance of health care provider.  9. Thrombocytopenia:  Patient is at risk for bleeding and needs daily physician monitoring of labs and blood loss prevention.  Patient will receive Physical therapy, Occupational therapy and Speech therapy each 60 minutes a day, 5 days a week for a total of 3 hours daily (minimum) for the duration of the rehabilitation stay within an interdisciplinary rehabilitation program with case manager/social worker, dietician, neuropsychologist, rehab nursing and PM&R oversight.    Rehabilitation Prognosis: Fair to good Medical Prognosis: The patient is deemed medically stable to tolerate, benefit from, and participate in IRF level services.  Tolerance for three hours of therapy a day: Good. Patient has participated well with therapies since 02-01-19 in the acute care setting and is anticipated to tolerate 3 hours of therapy per day, as required.       Goals/Barriers/Facilitators  Family / Patient Goals: return home to previous level of function  Mobility Goals: Overall goal is Modified independent and Physical Therapy will evaluate and treat ambulation/wheelchair use and bed transfers  Activities of Daily Living (ADLs) Goals: Overall goal is Modified independent and Occupational therapy will evaluate and treat basic Activities of Daily Living  Cognition / Communication Goals: Overall goal is Supervision and Speech therapy will evaluate and treat cognition and communication deficits and assess for safe swallow         Barriers & Interventions:   Caregiver Apprehension: Arrange caregiver support and discuss barriers and patient progress with caregivers when appropriate.  High Burden of Care: Initiate interdisciplinary rehabilitation to improve functional independence and reduce burden of care.  Medication Education:  Pharmacist and nursing staff to provide education to patient and family regarding medication side effects, special precautions, and safe administration.  Facilitators: good home setup, good family / social support, patient motivation, improving strength / endurance and improving medical condition    Discharge Planning  Expected Length of Stay 14 day(s)  Expected Discharge Disposition Home  Expected Discharge Needs: Patient would benefit from ongoing physical, occupational and speech therapy at the outpatient level of care. Patient already owned a stair glide, grab bars in shower, shower chair and a walker prior to hospitalization; however physical and occupational therapy will determine most appropriate equipment needs upon discharge to home to improve upon safety and decrease risk of falls.            Romie Minus, BSN, RN

## 2019-02-01 NOTE — Case Management (ED)
Case Management Admission Assessment    NAME:Rahil JAIRO HEINEMAN                          MRN: [id number]             DOB:1951/07/12          AGE: 68 y.o.  ADMISSION DATE: 01/31/2019             DAYS ADMITTED: LOS: 1 day      Today???s Date: 02/01/2019    Source of Information: Wife and EMR      Plan  Plan: Case Management Assessment, Assist PRN with SW/NCM Services, Discharge Planning for Facility Anticipated     Discharge Planning: Anticipate pt will discharge to [facility] pending insurance auth and medical stability.    ??? Pt lives at home with his wife and mother. Pt's wife has been assisting him with his care needs along with home health since his stroke in June. SW discussed placement options and she would like to go to [facility].  ??? Pt lives in a split level home, but has a chair glide for the stairs.  ??? Pt can remain on one level.  ??? Wife can assist and is in good health.  ??? Pt's PCP is Dr. Wilford Grist.    SW coordinated with Rehab admissions. Pt is appropriate and will start insurance auth.     Patient Address/Phone  [address] [place] [address]  There are no phone numbers on file.    Emergency Contact  No emergency contact information on file.    Healthcare Directive         Transportation  Does the patient need discharge transport arranged?: Yes(Facility transport)  Does the patient use Medicaid Transportation?: No    Expected Discharge Date  Expected Discharge Date: 02/02/19  Expected Discharge Time: 1300    Living Situation Prior to Admission  ? Living Arrangements  Type of Residence: Home, with Home Health or other assistance(Pt lives with his wife and mother at home. He has had home health with [facility] since his initial stroke in June. )  Living Arrangements: Spouse/significant other, Family members  Financial risk analyst / Tub: Psychologist, counselling  How many levels in the residence?: 2  Can patient live on one level if needed?: Yes  Does residence have entry and/or side stairs?: (Has a stair glide for stairs. Does not have to use any stairs.)  Assistance needed prior to admit or anticipated on discharge: Yes  Who provides assistance or could if needed?: Wife and Home Health  Are they in good health?: Yes  Can support system provide 24/7 care if needed?: Yes  ? Level of Function   Prior level of function: Needs assist with ADLs(has been needing assistance with all ADLS since his stroke in june. Multiple falls.)  Who assists with ADLs?: Wife and Home Health  ? Cognitive Abilities   Cognitive Abilities: Alert and Oriented    Financial Resources  ? Coverage  Primary Insurance: Medicare Replacement  Secondary Insurance: No insurance  Additional Coverage: RX(Fills at [facility] in [place]. No concerns for co-pays.)    ? Source of Income   Source Of Income: Other retirement income  ? Financial Assistance Needed?       Psychosocial Needs  ? Mental Health  Mental Health History: No  ? Substance Use History  Substance Use History Screen: No  ? Other       Current/Previous Services  ?  PCP  No primary care provider on file., None, None  ? Pharmacy    [facility] [address] [place] [address] [place] [address] [place] [address]  [place] [address]  Phone: [phone] Fax: [phone]    ? Durable Medical Equipment   Durable Medical Equipment at home: [employer]  ? Home Health  Receiving home health: Yes  Agency name: [facility]  Would patient use this agency again?: Yes  ? Hemodialysis or Peritoneal Dialysis  Undergoing hemodialysis or peritoneal dialysis: No  ? Tube/Enteral Feeds  Receive tube/enteral feeds: No  ? Infusion  Receive infusions: No  ? Private Duty  Private duty help used: No  ? Home and Community Based Services  Home and community based services: No  ? [name] Supply: N/A  ? Hospice  Hospice: No  ? Outpatient Therapy  PT: No  OT: No  SLP: No  ? Skilled Nursing Facility/Nursing Home  SNF: No  NH: No  ? Inpatient Rehab  IPR: No  ? Long-Term Acute Care Hospital  LTACH: No  ? Acute Hospital Stay Acute Hospital Stay: In the past  Was patient's stay within the last 30 days?: No    Dellia Beckwith, LMSW *[phone]

## 2019-02-01 NOTE — Progress Notes
0226- Pt called out to use the restroom. This RN assisted pt onto bedside commode using a stand-pivot technique.   0230- Pt began having uncontrolled circular motion of R arm, along with mild shaking of entire body. Pt wife reported that it will usually pass between 10-20 min.   Approximately 0234- Pt began having slurred speech and abnormal movements; pt closed eyes for several seconds at a time, but reported that he did not feel dizzy or nauseous. Pt remained calm and oriented, but appeared lethargic and inattentive. This RN called for assistance to move pt from commode to bed.  0300- Pt has been returned to bed, and, neurologically, back to baseline.   30- Dr. Farris Has notified, no new orders at this time. RN [employer].

## 2019-02-02 ENCOUNTER — Encounter: Admit: 2019-02-02 | Discharge: 2019-02-02

## 2019-02-02 LAB — POC GLUCOSE
Lab: 129 mg/dL — ABNORMAL HIGH (ref 70–100)
Lab: 120 mg/dL — ABNORMAL HIGH (ref 70–100)
Lab: 152 mg/dL — ABNORMAL HIGH (ref 70–100)
Lab: 167 mg/dL — ABNORMAL HIGH (ref 70–100)

## 2019-02-02 LAB — CBC: Lab: 5.6 K/UL — ABNORMAL HIGH (ref 4.5–11.0)

## 2019-02-02 LAB — COMPREHENSIVE METABOLIC PANEL
Lab: 0.8 mg/dL (ref 0.4–1.24)
Lab: 104 MMOL/L (ref 98–110)
Lab: 137 MMOL/L (ref 137–147)
Lab: 16 mg/dL (ref 7–25)
Lab: 171 mg/dL — ABNORMAL HIGH (ref 70–100)
Lab: 2.2 mg/dL — ABNORMAL HIGH (ref 0.3–1.2)
Lab: 22 MMOL/L (ref 21–30)
Lab: 246 U/L — ABNORMAL HIGH (ref 25–110)
Lab: 3 g/dL — ABNORMAL LOW (ref 3.5–5.0)
Lab: 3.9 MMOL/L (ref 3.5–5.1)
Lab: 38 U/L (ref 7–40)
Lab: 41 U/L (ref 7–56)
Lab: 6.7 g/dL (ref 6.0–8.0)
Lab: 8.8 mg/dL (ref 8.5–10.6)
Lab: >60 mL/min (ref >60)
Lab: >60 mL/min (ref >60)
Lab: 11 (ref 3–12)

## 2019-02-02 LAB — IMMUNOFIXATION, SERUM (IFES)

## 2019-02-02 LAB — CORTISOL-AM: Lab: 14 ug/dL (ref 6.7–22.6)

## 2019-02-02 LAB — FOLATE, SERUM: Lab: 12 ng/mL (ref >3.9)

## 2019-02-02 LAB — IRON + BINDING CAPACITY + %SAT+ FERRITIN: Lab: 104 ug/dL (ref 50–185)

## 2019-02-02 LAB — RETICULOCYTE COUNT: Lab: 1.2 % (ref 0.5–2.0)

## 2019-02-02 LAB — BASIC METABOLIC PANEL: Lab: 139 MMOL/L — ABNORMAL LOW (ref >60)

## 2019-02-02 MED ORDER — MIDODRINE 2.5 MG PO TAB
2.5 mg | Freq: Three times a day (TID) | ORAL | 0 refills | Status: DC
Start: 2019-02-02 — End: 2019-02-04
  Administered 2019-02-02 – 2019-02-04 (×7): 2.5 mg via ORAL

## 2019-02-02 NOTE — Progress Notes
Neurology Progress Note      Mitchell Lucero  Admission Date: 01/31/2019  LOS: 2 days     Assessment/Plan:  Principal Problem:    TIA (transient ischemic attack)    Mitchell Lucero is a 68 y.o. R handed male with PMH of HTN, DM2, C6-C7 ADCF in 1997, and recent left pontine stroke in 12/18/2018 who was transferred to [facility] for a transient episode of dysarthria for 1.5 hrs in the morning of 01/31/2019 associated with right arm large amplitude irregular involuntary movement and both leg shaking with retained awareness. He has been having increasing number of falls since he was discharged from rehab, primarily at night when its dark, even with walker. Unintentional weight loss of 30 lbs over the past year.  ???  Admission Neuro exam: AAO x3 not fully to time, Impaired repetition, No tongue atrophy/fasciculation/weakness, SA 4/4, EF and EE 4/5, FF/FE/FA/TA 3-4/4, with visible FDI atrophy bilaterally with fasciculation in the right FDI; diffusely hyperreflexic with jaw reflex, right hoffman, left grasp reflex, bilateral babinski signs, slow finger tapping bilaterally and clumsy heel shin on the left leg.    Recent pontine stroke with transient slurred speech  Transient episode of dysarthria and involuntary movement in the right arm, and both leg shaking.  Recurrent falls  -DDX:  Differential is broad at this point , his multiple exam findings could be explained by different Disease processes including his previous pontine stroke with involvement of  Middle cerebellar peduncle ( explains left sided Dysmetria and ataxia). Current Symptoms of Right arm Shaking with Dysarthria Likely represent TIA ( Limb shaking TIA ) given his extensive intracranial atherosclerosis . His symptoms usually happen when he stand up ( with decreased brain blood supply in the setting of orthostatsis). 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   - Degenerative Disorders is still in the differential given the orthostatic hypotension , gait issues and abnormal movements.  - MRI brain: with Signal abnormality without atrophy involving the left aspect of the  pons and left middle cerebellar peduncle corresponding with his recent pontine infarct.lack of significant atrophy somewhat goes against brainstem/cerebellar neurodegenerative diseases.  - MRI C- Spine: ???Prior ACDF at C6-C7 without high-grade central or neural foraminal   stenosis at the operated level.Multilevel central spinal stenosis, most pronounced of suspected at   least moderate degree at C4-C5.Multilevel degenerative neural foraminal narrowing.  - CTA brain Dense cavernous carotid calcification, ???Short segment occlusion or high-grade stenosis of the right P2 arterial segment, ???Dense atherosclerotic calcification within the V4 segment segments.  (moderate or high grade stenosis) , Mild stenosis of the left M1 origin.  - CTA neck : ???Mixed, predominantly soft plaque atherosclerosis of the right ICA   origin resulting in less than 50% stenosis by NASCET criteria. No significant left carotid stenosis by NASCET criteria.???Atherosclerotic calcification at the right vertebral artery origin resulting in at least mild stenosis.  - A1C 6.8 , Lipid Panel with LDL of 124 .   - 2-D echo with EF of 55% , Grade 1 LV diastolic dysfunction. Moderately Dilated Left atrium. Mild aortic stenosis. No evidence of intraatrial shunting.  - EEG 07/21: No epileptiform activity or lateralizing signs are seen  Plan  - Optimization of risk factors for secondary prevention of stroke:   > Continue DAPT Aspirin  81 mg daily , Plavix 75 mg daily.   > Continue High intensity statin Atorvastatin 80 mg QHS .  Goal LDL < 70.   > Control orthostatic hypotension to prevent cerebral hypoperfusion in the setting if intracranial atherosclerosis. > Will order Cardiac Event monitor on discharge to rule out Afib given left atrial dilation on Echo..  - Follow up with movement disorder on discharge to rule out Neurodegenerative process like MSA.  - PT/OT following , recommends inpatient setting on discharge.      Orthostatic Hypotension   - Patient reported Dizziness on standing.  - Orthostatic vital signs positive at the OSH . Orthostatic vital signs repeated on admission to [place] and was again positive.  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 Other Possibility include Neurodegenerative disorders (like MSA).  - AM cortisol WNL.  - 07/21 : Received Fluid bolus . Rechecked orthostatic vital signs later still Significantly positive SBP 127 Supine to 76.  Plan  - Continue to hold PTA Lisinopril and Flomax.  - Started Midodrine 2.5 mg TID.  - Compression stocking and abdominal binder.  - Cardiology follow up on discharge.      Burning pain and hypersensitivity in soles bilaterally  Dimisnhed vibration over bilateral lower extremities.  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  Plan  - HbA1c (6.8),  IFES ( Pending) ,  vitamin B12 ( 1,044 )    ???  DM2   - PTA Levemir 10 unites QHS and metformin 500 mg BID  > hold metformin, lantus 10 units QHS, LDCF  ???  FEN: no IVF  replete as needed, Cardiac diet and diabetic diet.  VTE ppx: heparin SQ  Dispo : Maintain admission to neurology service , Pending rehab placement.  Code : Full code.      Patient was seen and discussed with Dr. Consuella Lose.     , MBChB.  PGY-2  Pager:[phone]  ________________________________________________________________________    Subjective:    Mitchell Lucero is a 68 y.o. male.   Patient was feeling better today , no further episodes since admission . Patient reported poor appetite but has no other complaints otherwise.  Patient was noted to be orthostatic when checking his blood pressure but he denies Diziness or lightheadedness .    Objective: Vital Signs:  Last Filed                   Vital Signs: 24 Hour Range   BP: 117/73 (07/22 1113)  Temp: 36.8 ???C (98.2 ???F) (07/22 1005)  Pulse: 79 (07/22 1110)  Respirations: 16 PER MINUTE (07/22 1005)  SpO2: 99 % (07/22 1005)  BP: (76-167)/(54-86)   Temp:  [36.7 ???C (98 ???F)-36.8 ???C (98.2 ???F)]   Pulse:  [73-97]   Respirations:  [15 PER MINUTE-16 PER MINUTE]   SpO2:  [97 %-99 %]     Intensity Pain Scale (Self Report): (not recorded)    Intake/Output Summary: (Last 24 hours)    Intake/Output Summary (Last 24 hours) at 02/02/2019 1311  Last data filed at 02/02/2019 0654  Gross per 24 hour   Intake ???   Output 225 ml   Net -225 ml      Stool Occurrence: 0(Tried with no luck)    Medications:  Scheduled Meds:aspirin chewable tablet 81 mg, 81 mg, Oral, QDAY  atorvastatin (LIPITOR) tablet 80 mg, 80 mg, Oral, QHS  citalopram (CeleXA) tablet 20 mg, 20 mg, Oral, QDAY  clopiDOGrel (PLAVIX) tablet 75 mg, 75 mg, Oral, QDAY  heparin (porcine) PF syringe 5,000 Units, 5,000 Units, Subcutaneous, Q8H  insulin aspart U-100 (NOVOLOG FLEXPEN) injection PEN 0-6 Units, 0-6 Units,  Subcutaneous, ACHS (22)  insulin glargine (LANTUS SOLOSTAR) injection PEN 10 Units, 10 Units, Subcutaneous, QHS  midodrine (PROAMATINE) tablet 2.5 mg, 2.5 mg, Oral, TID (02-23-16)  pantoprazole DR (PROTONIX) tablet 20 mg, 20 mg, Oral, QDAY(21)  vitamin A & D topical ointment, , Topical, BID    Continuous Infusions:  PRN and Respiratory Meds:loperamide QDAY PRN       General physical exam:    HEENT: normocephalic, eyes open with no discharge, nares patent, oropharynx is clear with no lesions, palate intact  Carotids: No bruits  CV: regular rate and rhythm, no murmur, distal pulses palpable  Chest: normal configuration, lungs are clear bilaterally  Ab: soft, non-tender, no masses, no organomegaly  Skin: no rashes or lesions    Neuro exam:   Mental status: alert, oriented to person/place/time       Speech:    Normal Abnormal   Fluency x    Comprehension x Articulation x    Repetition x    Naming x        Cranial Nerves:    Normal Abnormal   II Pupils reactive, visual fields normal    III, IV, VI EOMI, no nystagmus    V Sensation nml V1-V3    VII Orbicularis Oculi: 5    VIII nml to finger rub    IX, X +strong cough    XI Equal shoulder shrug    XII Tongue midline        Muscle/motor:   Tone: increased all over  Bulk: FDI atrophy bilaterally.  Fasciculations: FDI fasciculations bilaterally.  Pronator drift: none     NF NE SA EF EE WE WF FF FE FA TA HF HE KF KE DF PF   R   4+ 4+ 4 5 5 4 4 4 3 4  4+ 5 5 5 5    L   4+ 4+ 4+ 5 5 4 4 4 3 4 4 5 5 5 5        Sensation:                Light touch: diminished over bilateral lower extremeties up to the                Pin prick: Diminished up to the level of bilateral knees.               Vibration Diminshed over bilateral toes.               Proprioception: intact joint position sense over Bilateral lower extremieties    Coordination:    Normal Abnormal Right Abnormal Left   Finger to Nose   X slow with mild dysmetria.   Rapid alternating   slow slow   Heel to [employer] on the left side   Finger tap  slow slow   Foot tap      Other        Reflexes:    Right Left   Triceps 2+ 2+   Biceps 2+ 2+   Brachioradialis 2+ 2+   Patella 2+ 2+   Ankle 1 1   Plantar up up       Other reflexes:  Jaw: Brisk   Hoffman: present on the right side.      Laboratory Review:     No results found for: PHART, PCO2A, PO2ART, HCO3A, BASEEXA, BASEDEFA, O2SATACAL  Lab Results   Component Value Date    HGB 12.1 (L) 02/02/2019    HCT 34.9 (L) 02/02/2019  PLTCT 94 (L) 02/02/2019    WBC 5.6 02/02/2019     Lab Results   Component Value Date    NA 139 02/02/2019    K 3.7 02/02/2019    CL 106 02/02/2019    CO2 27 02/02/2019    GAP 6 02/02/2019    BUN 14 02/02/2019    CR 0.70 02/02/2019    GLU 117 (H) 02/02/2019    CA 8.5 02/02/2019     No results found for: TNI, CKMB, MYOGLB  No results found for: PTT, INR, FIB, DIMER, FDP No results found for: TSH, Oren Bracket  Lab Results   Component Value Date    CHOL 193 02/01/2019    TRIG 70 02/01/2019    HDL 60 02/01/2019    LDL 124 (H) 02/01/2019    VLDL 14 02/01/2019     No results found for: VANRAN, VANPK, VANTR  No results found for: CYCLOSPOR, TACROLIMUS, FREEPHENY     Point of Care Testing:  (Last 24 hours):  Glucose: (!) 117  POC Glucose (Download): (!) 152    Radiology and Other Diagnostics Review: review    Jill Alexanders, MBChB   Neurology Resident

## 2019-02-02 NOTE — Progress Notes
EEG was performed by [facility] using 10-20 system with T1/T2.   Impedances [id number] without complications.

## 2019-02-02 NOTE — Procedures
INPATIENT >1 HOUR EEG REPORT    [place]  [birth year]  [id number]    Date of service: 02/02/19    History: This is a 68 y.o. male presenting with episodic dysarthria and right arm weakness / involuntary movements.    Pertinent medications: No pertinent medications    Introduction:  This study was performed using digital electroencephalographic recording equipment. International 10-20 electrode placement was used along with FT9 and FT10 electrodes. The record was obtained with the patient in the awake and asleep states.  Photic stimulation is performed.  Hyperventilation is not performed.  The study is performed from 16:42 to 17:50 on 02/01/19.    Description:   The posterior dominant rhythm is 6-7 Hz.  The EEG is further described as containing intermittent generalized delta slowing that is frequent.    Drowsiness results in attenuation of the background and increased theta activity. Stage II of sleep is seen, as evidenced by presence of sleep spindles.    Technical interpretation:   This EEG is abnormal due to slowed PDR and intermittent generalized slowing.     Clinical correlation;   This abnormal >1 hour EEG is indicative of a mild encephalopathy.  No epileptiform activity or lateralizing signs are seen.    Harrison Mons, M.D.  PGY-3    Attestation    I personally reviewed this study and the fellow's report and formulated the above mentioned opinions and interpretations in this study.    Lynett Grimes, M.D.

## 2019-02-02 NOTE — Case Management (ED)
Case Management Progress Note    NAME:Mitchell Lucero                          MRN: [id number]              DOB:21-Nov-1950          AGE: 68 y.o.  ADMISSION DATE: 01/31/2019             DAYS ADMITTED: LOS: 2 days      Todays Date: 02/02/2019    Plan  Discharge to [facility] pending insurance [place].     Interventions  ? Support      ? Info or Referral      ? Discharge Planning    Attended huddle and reviewed EMR.   SW communicated with [facility]. They continue to work on Ship broker. They are aware pt is medically stable for d/c today.   ? Medication Needs                                                 ? Financial      ? Legal      ? Other        Disposition  ? Expected Discharge Date    Expected Discharge Date: 02/02/19  Expected Discharge Time: 1300  ? Transportation   Does the patient need discharge transport arranged?: Yes(Facility transport)  Does the patient use Medicaid Transportation?: No  ? Next Level of Care (Acute Psych discharges only)      ? Discharge Disposition                                          Durable Medical Equipment      No service has been selected for the patient.      [place] Destination      No service has been selected for the patient.      [place]      No service has been selected for the patient.      [facility]      No service has been selected for the patient.        Janean Sark, [place]

## 2019-02-02 NOTE — Progress Notes
CLINICAL NUTRITION                                                        Clinical Nutrition Initial Assessment    Name: Mitchell Lucero        MRN: [id number]          DOB: 03-03-1951          Age: 68 y.o.  Admission Date: 01/31/2019             LOS: 2 days      Recommendation:  Conitnue diet as ordered    Comments:  68 year old male with history of HTN, DM2, C6-C7 ADCF in 1997, and recent left pontine stroke in 12/18/2018 who was transferred to [facility] for a transient episode of dysarthria for 1.5 hrs in the morning of 01/31/2019 associated with right arm large amplitude irregular involuntary movement and both leg shaking with retained awareness. Pt reports variable appetite, currently is good and eating well, with wt loss >30# over past year. Noted A1c 6.8%--reviewed DM diet needs with pt/spouse today as well as protein needs for wound healing as pt with stage 2 pressure ulcer on ankle.    Nutrition Assessment of Patient:  Admit Weight: 70.3 kg; Usual Weight: 86.2 kg;    BMI (Calculated): 25.02; BMI Categories Adult: Over Weight: 25-29.9; Appearance: Thin  Pertinent Allergies/Intolerances: none   Pertinent Labs: A1c 6.8% on 7/20; Pertinent Meds: reviewed; Unintentional Weight Loss: 20% in 1 year (significant)  Oral Diet Order: Diabetic 1600-2000 Kcal/day (60 g carb/meal, 30 g carb/HS snack);    Current Oral Intake: Adequate  Estimated Calorie Needs: 1970kcal (28/kg admit wt)  Estimated Protein Needs: 91g (1.3/kg admit wt)    Malnutrition Assessment:   Does not meet criteria;  ;  ;  ;  ;      Nutrition Focused Physical Assessment:   Loss of Subcutaneous Fat: No;  ;    Muscle Wasting: Yes; Severity: Moderate; Location: Deltoid, Interosseous, Temple  Edema: Yes; Severity: Mild; Location: Lower extremities  Pressure Injury: stage 2 ankle    Nutrition Diagnosis:  Increased nutrient needs, specify:(kcal, protein)  Etiology: wound healing  Signs & Symptoms: stage 2 pressure ulcer    Intervention / Plan: Monitor adequacy and tolerance of PO intake, wt status, pertinent labs, skin integrity    Goals:  Patient to consume >75% of meals  Time Frame: Throughout stay    Rico Ala, MS, RD, LD, CNSC     Available on [facility]

## 2019-02-02 NOTE — Progress Notes
PHYSICAL THERAPY  PROGRESS NOTE        Name: Mitchell Lucero        MRN: [id number]          DOB: 1951/05/23          Age: 68 y.o.  Admission Date: 01/31/2019             LOS: 2 days        Mobility  Patient Turn/Position: Chair  Progressive Mobility Level: Walk in hallway  Distance Walked (feet): 100 ft  Level of Assistance: Assist X1  Assistive Device: Walker  Time Tolerated: 11-30 minutes  Activity Limited By: Weakness;Other (Comment)(Bowel urgency (unsuccessful), team in for labs)    Subjective  Significant hospital events: 68 y.o male who transferred to [place] for a transient episode of dysarthria associated with right arm large amplitude irregular involuntary movement and both leg shaking with retained awareness. Pt has been having increasing number of falls at home. 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  Mental / Cognitive Status: Alert;Oriented;Cooperative  Persons Present: Spouse  Pain: Patient has no complaint of pain  Pain Interventions: Patient agrees to participate in therapy  Comments: Ted hose and abdominal binder donned prior to upright mobility at this time.   Ambulation Assist: Assist Needed with Mobility-Related ADL's/Ambulation  Patient Owned Equipment: Nurse, adult  Home Situation: Lives with Family(Spouse and mother)  Type of Home: House(Split-level)  Entry Stairs: Stair Glide  In-Home Stairs: Stair Glide  Comments: Patient denies dizziness at this time and throughout any point this date. Reports team is working on one of his BP medications, but he is unsure which one. Abdominal binder and ted hose donned prior to upright mobility. Patient reports he had been having loose stools over the last few days, but has been unable to have a BM the last 1-2 days. Was agreeable to work with PT this afternoon.     Strength  Strength Position Assessed: Seated  Overall Strength: Able to Move All Joints Independently Through Available ROM;Generalized Weakness    Posture/Neurological  Head Control: Independent  Posture: Rounded Shoulders  Overall Tone: Normal    Bed Mobility/Transfer  Comments: Patient up in bedside chair at beginning and end of session. Bed mobility not assessed this date.   Transfer Type: Sit to Stand  Transfer: Assistance Level: From;Bed Side Chair;Minimal Assist  Transfer: Assistive Device: Nurse, adult  Transfers: Type Of Assistance: For Safety Considerations;For Strength Deficit;For Balance;Verbal Cues  Other Transfer Type: Sit to/from Stand  Other Transfer: Assistance Level: To/From;Toilet;Minimal Assist  Other Transfer: Assistive Device: Nurse, adult  Other Transfer: Type Of Assistance: For Safety Considerations;For Strength Deficit;For Balance;Verbal Cues  End Of Activity Status: Up in Chair;Nursing Notified;Instructed Patient to Use Call Light;Instructed Patient to Request Assist with Mobility    Gait  Gait Distance: 100 feet  Gait: Assistance Level: Minimal Assist;Safety Considerations  Gait: Assistive Device: Roller Walker  Gait: Descriptors: Variable step length;No balance loss;Forward trunk flexion(Decreased knee extension in stance phase bilaterally)  Comments: Patient continues to deny dizziness throughout gait activity this afternoon. Ambulates with decreased step length and requires cues to ambulate close to the walker for optimal upright positioning.   Activity Limited By: Weakness    Education  Persons Educated: Patient/Family  Patient Barriers To Learning: None Noted  Interventions: Repetition of Instructions;Demonstration Provided  Teaching Methods: Verbal Instruction;Demonstration  Patient Response: Verbalized Understanding;More Instruction Required  Topics: Plan/Goals of PT Interventions;Use of Assistive  Device/Orthosis;Mobility Progression;Safety Awareness;Up with Assist Only;Importance of Increasing Activity;Recommend Continued Therapy    Assessment/Progress Impaired Mobility Due To: Decreased Strength;Impaired Balance;Decreased Activity Tolerance;Safety Concerns  Assessment/Progress: Should Improve w/ Continued PT    AM-PAC 6 Clicks Basic Mobility Inpatient  Turning from your back to your side while in a flat bed without using bed rails: A Little  Moving from lying on your back to sitting on the side of a flatbed without using bedrails : A Little  Moving to and from a bed to a chair (including a wheelchair): A Little  Standing up from a chair using your arms (e.g. wheelchair, or bedside chair): A Little  To walk in hospital room: A Little  Climbing 3-5 steps with a railing: A Lot  Raw Score: 17  Standardized (T-scale) Score: 39.67  Basic Mobility CMS 0-100%: 43.83  CMS G Code Modifier for Basic Mobility: CK    Goals  Goal Formulation: With Patient/Family  Time For Goal Achievement: 5 days, To, 7 days  Patient Will Go Supine To/From Sit: w/ Stand By Assist  Patient Will Transfer Bed/Chair: w/ Stand By Assist  Patient Will Transfer Sit to Stand: w/ Stand By Assist  Patient Will Ambulate: 51-100 Feet, w/ Dan Humphreys, w/ Stand By Assist    Plan  Treatment Interventions: Mobility Training;Strengthening  Plan Frequency: 5 Days per Week  PT Plan for Next Visit: Progress proximal LE strengthening and consider 5x sit<>stand as standardized outcome for assessing. Progress gait distance with roller walker as able.     PT Discharge Recommendations  Recommendation: Inpatient setting;Recommend rehab medicine consult  Patient Currently Requires Physical Assist With: All mobility    Therapist: Edward Jolly, PT, DPT [phone]  Date: 02/02/2019

## 2019-02-03 ENCOUNTER — Encounter: Admit: 2019-02-03 | Discharge: 2019-02-03

## 2019-02-03 DIAGNOSIS — G459 Transient cerebral ischemic attack, unspecified: Principal | ICD-10-CM

## 2019-02-03 LAB — HEPATITIS PANEL, ACUTE

## 2019-02-03 LAB — GGTP: Lab: 192 U/L — ABNORMAL HIGH (ref 9–64)

## 2019-02-03 LAB — CBC
Lab: 102 K/UL — ABNORMAL LOW (ref 150–400)
Lab: 12 g/dL — ABNORMAL LOW (ref 13.5–16.5)
Lab: 15 % — ABNORMAL HIGH (ref 11–15)
Lab: 3.9 M/UL — ABNORMAL LOW (ref 4.4–5.5)
Lab: 31 pg — ABNORMAL LOW (ref 26–34)
Lab: 34 % — ABNORMAL LOW (ref 40–50)
Lab: 5.8 10*3/uL (ref 4.5–11.0)
Lab: 8.1 FL (ref 7–11)
Lab: 88 FL — ABNORMAL HIGH (ref 80–100)

## 2019-02-03 LAB — HEPATITIS B SURFACE AB: Lab: NEGATIVE

## 2019-02-03 LAB — COMPREHENSIVE METABOLIC PANEL
Lab: 112 mg/dL — ABNORMAL HIGH (ref 70–100)
Lab: 136 MMOL/L — ABNORMAL LOW (ref 137–147)
Lab: 3.7 MMOL/L (ref 3.5–5.1)
Lab: >60 mL/min (ref >60)
Lab: >60 mL/min (ref >60)
Lab: 6 (ref 3–12)

## 2019-02-03 LAB — POC GLUCOSE
Lab: 211 mg/dL — ABNORMAL HIGH (ref 70–100)
Lab: 120 mg/dL — ABNORMAL HIGH (ref 70–100)
Lab: 157 mg/dL — ABNORMAL HIGH (ref 70–100)
Lab: 203 mg/dL — ABNORMAL HIGH (ref 70–100)

## 2019-02-03 LAB — BILIRUBIN, DIRECT: Lab: 0.8 mg/dL — ABNORMAL HIGH (ref <0.4)

## 2019-02-03 LAB — AMMONIA: Lab: 130 umol/L — ABNORMAL HIGH (ref 9–35)

## 2019-02-03 LAB — HEPATITIS B CORE AB TOT (IGG+IGM)

## 2019-02-03 LAB — PERIPHERAL SMEAR

## 2019-02-03 MED ORDER — MIDODRINE 2.5 MG PO TAB
2.5 mg | ORAL_TABLET | Freq: Two times a day (BID) | ORAL | 1 refills | Status: CN
Start: 2019-02-03 — End: ?

## 2019-02-03 MED ORDER — SODIUM CHLORIDE 0.9 % IJ SOLN
50 mL | Freq: Once | INTRAVENOUS | 0 refills | Status: CP
Start: 2019-02-03 — End: ?
  Administered 2019-02-03: 16:00:00 50 mL via INTRAVENOUS

## 2019-02-03 MED ORDER — IOHEXOL 350 MG IODINE/ML IV SOLN
80 mL | Freq: Once | INTRAVENOUS | 0 refills | Status: CP
Start: 2019-02-03 — End: ?
  Administered 2019-02-03: 16:00:00 80 mL via INTRAVENOUS

## 2019-02-03 NOTE — Case Management (ED)
Case Management Progress Note    NAME:Mitchell Lucero                          MRN: [id number]              DOB:18-Feb-1951          AGE: 68 y.o.  ADMISSION DATE: 01/31/2019             DAYS ADMITTED: LOS: 3 days      Todays Date: 02/03/2019    Plan  Pt not stable for d/c today due to finding of liver mass and needing CT Scan (as of this morning pt and family not aware of this finding).    Discharge planning to [place] pending findings and plan regarding liver mass.    Interventions  ? Support      ? Info or Referral      ? Discharge Planning    Attended huddle and reviewed EMR.   SW notified Rehab pt not stable for d/c today. They have received insurance auth. Will re-assess tomorrow.   ? Medication Needs                                                 ? Financial      ? Legal      ? Other        Disposition  ? Expected Discharge Date    Expected Discharge Date: 02/03/19  Expected Discharge Time: 1700  ? Transportation   Does the patient need discharge transport arranged?: Yes(Facility transport)  Does the patient use Medicaid Transportation?: No  ? Next Level of Care (Acute Psych discharges only)      ? Discharge Disposition                                          Durable Medical Equipment      No service has been selected for the patient.      [place] Destination      No service has been selected for the patient.      [place]      No service has been selected for the patient.      [facility]      No service has been selected for the patient.        Janean Sark, [place]

## 2019-02-03 NOTE — Progress Notes
Neurology Progress Note      Mitchell Lucero  Admission Date: 01/31/2019  LOS: 3 days     Assessment/Plan:  Principal Problem:    TIA (transient ischemic attack)    Mitchell Lucero is a 68 y.o. R handed male with PMH of HTN, DM2, C6-C7 ADCF in 1997, and recent left pontine stroke in 12/18/2018 who was transferred to [facility] for a transient episode of dysarthria for 1.5 hrs in the morning of 01/31/2019 associated with right arm large amplitude irregular involuntary movement and both leg shaking with retained awareness. He has been having increasing number of falls since he was discharged from rehab, primarily at night when its dark, even with walker. Unintentional weight loss of 30 lbs over the past year.  ???  Admission Neuro exam: AAO x3 not fully to time, Impaired repetition, No tongue atrophy/fasciculation/weakness, SA 4/4, EF and EE 4/5, FF/FE/FA/TA 3-4/4, with visible FDI atrophy bilaterally with fasciculation in the right FDI; diffusely hyperreflexic with jaw reflex, right hoffman, left grasp reflex, bilateral babinski signs, slow finger tapping bilaterally and clumsy heel shin on the left leg.    Recent pontine stroke with transient slurred speech  Transient episode of dysarthria and involuntary movement in the right arm, and both leg shaking.  Recurrent falls  -DDX:  Differential is broad at this point , his multiple exam findings could be explained by different Disease processes including his previous pontine stroke with involvement of  Middle cerebellar peduncle ( explains left sided Dysmetria and ataxia). Current Symptoms of Right arm Shaking with Dysarthria Likely represent TIA ( Limb shaking TIA ) given his extensive intracranial atherosclerosis . His symptoms usually happen when he stand up ( with decreased brain blood supply in the setting of orthostatsis). 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   - Degenerative Disorders is still in the differential given the orthostatic hypotension , gait issues and abnormal movements.  - MRI brain: with Signal abnormality without atrophy involving the left aspect of the  pons and left middle cerebellar peduncle corresponding with his recent pontine infarct.lack of significant atrophy somewhat goes against brainstem/cerebellar neurodegenerative diseases.  - MRI C- Spine: ???Prior ACDF at C6-C7 without high-grade central or neural foraminal   stenosis at the operated level.Multilevel central spinal stenosis, most pronounced of suspected at   least moderate degree at C4-C5.Multilevel degenerative neural foraminal narrowing.  - CTA brain Dense cavernous carotid calcification, ???Short segment occlusion or high-grade stenosis of the right P2 arterial segment, ???Dense atherosclerotic calcification within the V4 segment segments.  (moderate or high grade stenosis) , Mild stenosis of the left M1 origin.  - CTA neck : ???Mixed, predominantly soft plaque atherosclerosis of the right ICA   origin resulting in less than 50% stenosis by NASCET criteria. No significant left carotid stenosis by NASCET criteria.???Atherosclerotic calcification at the right vertebral artery origin resulting in at least mild stenosis.  - A1C 6.8 , Lipid Panel with LDL of 124 .   - 2-D echo with EF of 55% , Grade 1 LV diastolic dysfunction. Moderately Dilated Left atrium. Mild aortic stenosis. No evidence of intraatrial shunting.  - EEG 07/21: No epileptiform activity or lateralizing signs are seen  Plan  - Optimization of risk factors for secondary prevention of stroke:   > Continue DAPT Aspirin  81 mg daily , Plavix 75 mg daily.   > Continue High intensity statin Atorvastatin 80 mg QHS .  Goal LDL < 70.   > Control orthostatic hypotension to prevent cerebral hypoperfusion in the setting if intracranial atherosclerosis. > Will order Cardiac Event monitor on discharge to rule out Afib given left atrial dilation on Echo..  - Follow up with movement disorder on discharge to rule out Neurodegenerative process like MSA.  - Given the new concern for Cirrhosis and hepatocellular carcinoma , Will obtain Amonia level.  - PT/OT following , recommends inpatient setting on discharge.      Orthostatic Hypotension , Improving.   - Patient reported Dizziness on standing.  - Orthostatic vital signs positive at the OSH . Orthostatic vital signs repeated on admission to [facility] and was again positive.  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 Other Possibility include Neurodegenerative disorders (like MSA).  - AM cortisol WNL.  - 07/21 : Received Fluid bolus . Rechecked orthostatic vital signs later still Significantly positive SBP 127 Supine to 76.  - 07/22 : Orthostatic vitals improving  With the stockings and Binder :supine  SBP 155 --> 138 standing   Plan  - Continue to hold PTA Lisinopril and Flomax.  - Continue Midodrine 2.5 mg TID.  - Compression stocking and abdominal binder.  - Cardiology follow up on discharge.    Concern for Hepatocellular carcinoma   Cirrhosis  - On admission , patient reported around 30 pounds weight loss over the last year.  - CBC showed evidence of thrombocytopenia , Further workup with CMP showed total bili of 2 , elevated ALKP and GGT.  - Abdominal U/S showed ???Heterogeneous liver with posterior right hepatic mass,  - CT abdomen and Pelvis with contrast : Enhancing hepatic dome segment 7/8 lesion with washout, compatible with hepatocellular carcinoma. Cirrhosis with portal hypertension, including mild splenomegaly, portosystemic varices, and small volume ascites.Mild peritoneal thickening and enhancement, which may reflect infectious peritonitis. Metastatic disease could appear similar.  Prominent periportal, portacaval, and epiphrenic lymph nodes, which are indeterminate and may be reactive or metastatic.  Plan  - Will consulted oncology and hepatology.  - Hepatitis Panel ordered.    DM2   - PTA Levemir 10 unites QHS and metformin 500 mg BID  > hold metformin, lantus 10 units QHS, LDCF  ???  FEN: no IVF  replete as needed, Cardiac diet and diabetic diet.  VTE ppx: heparin SQ  Dispo : Maintain admission to neurology service     Code : Full code.      Patient was seen and discussed with Dr. Consuella Lose.     , MBChB.  PGY-2  Pager:[phone]  ________________________________________________________________________    Subjective:    Mitchell Lucero is a 68 y.o. male.   Patient was feeling better today , no further episodes of Dysarthria or arm shaking , were noted , denies dizziness or lightheadedness.  Explained the finding of an abdominal mass on ultrasounds.  Went and saw the patient later in the afternoon and updated them the he likely has HCC based on CT findings .Discussed the plan of involving hepatology and oncology in the patient care.         Objective:                    Vital Signs:  Last Filed                   Vital Signs: 24 Hour Range   BP: 138/79 (07/23 1001)  Temp: 36.6 ???C (97.8 ???F) (07/23 1001)  Pulse: 85 (07/23 1001)  Respirations: 18 PER MINUTE (07/23 0836)  SpO2: 99 % (07/23 1001)  Height: 167.6 cm (66) (07/22 1400)  BP: (116-179)/(66-91)   Temp:  [36.6 ???C (97.8 ???F)-37.1 ???C (98.8 ???F)]   Pulse:  [78-94]   Respirations:  [17 PER MINUTE-18 PER MINUTE]   SpO2:  [92 %-100 %]     Intensity Pain Scale (Self Report): (not recorded)    Intake/Output Summary: (Last 24 hours)    Intake/Output Summary (Last 24 hours) at 02/03/2019 1317  Last data filed at 02/03/2019 1030  Gross per 24 hour   Intake 730 ml   Output 420 ml   Net 310 ml      Stool Occurrence: 0    Medications:  Scheduled Meds:aspirin chewable tablet 81 mg, 81 mg, Oral, QDAY  atorvastatin (LIPITOR) tablet 80 mg, 80 mg, Oral, QHS  citalopram (CeleXA) tablet 20 mg, 20 mg, Oral, QDAY clopiDOGrel (PLAVIX) tablet 75 mg, 75 mg, Oral, QDAY  heparin (porcine) PF syringe 5,000 Units, 5,000 Units, Subcutaneous, Q8H  insulin aspart U-100 (NOVOLOG FLEXPEN) injection PEN 0-6 Units, 0-6 Units, Subcutaneous, ACHS (22)  insulin glargine (LANTUS SOLOSTAR) injection PEN 10 Units, 10 Units, Subcutaneous, QHS  midodrine (PROAMATINE) tablet 2.5 mg, 2.5 mg, Oral, TID (02-23-16)  pantoprazole DR (PROTONIX) tablet 20 mg, 20 mg, Oral, QDAY(21)  vitamin A & D topical ointment, , Topical, BID    Continuous Infusions:  PRN and Respiratory Meds:loperamide QDAY PRN       General physical exam:    HEENT: normocephalic, eyes open with no discharge, nares patent, oropharynx is clear with no lesions, palate intact  Carotids: No bruits  CV: regular rate and rhythm, no murmur, distal pulses palpable  Chest: normal configuration, lungs are clear bilaterally  Ab: soft, non-tender, no masses, no organomegaly  Skin: petechial skin lesions over bilateral feet.      Neuro exam:   Mental status: alert, oriented to person/place/time       Speech:    Normal Abnormal   Fluency x    Comprehension x    Articulation x    Repetition x    Naming x        Cranial Nerves:    Normal Abnormal   II Pupils reactive, visual fields normal    III, IV, VI EOMI, no nystagmus    V Sensation nml V1-V3    VII Orbicularis Oculi: 5    VIII nml to finger rub    IX, X +strong cough    XI Equal shoulder shrug    XII Tongue midline        Muscle/motor:   Tone: increased all over  Bulk: FDI atrophy bilaterally.  Fasciculations: FDI fasciculations bilaterally.  Pronator drift: none     NF NE SA EF EE WE WF FF FE FA TA HF HE KF KE DF PF   R   4+ 4+ 4 4 5 4 4 4 3  4+ 4+ 5 5 5 5    L   4+ 5 4+ 4+ 5 4 4 4 3  4+ 4 5 5 5 5        Sensation:                Light touch: diminished over bilateral lower extremeties up to the                Pin prick: Diminished up to the level of bilateral knees.               Vibration Diminshed  over bilateral toes. Proprioception: intact joint position sense over Bilateral lower extremieties    Coordination:    Normal Abnormal Right Abnormal Left   Finger to Nose   X slow with very mild dysmetria (improvig)   Rapid alternating   slow slow   Heel to [employer] on the left side   Finger tap  slow slow   Foot tap      Other        Reflexes:    Right Left   Triceps 2+ 2+   Biceps 2+ 2+   Brachioradialis 2+ 2+   Patella 2+ 2+   Ankle 1 1   Plantar up up       Other reflexes:  Jaw: Brisk   Hoffman: present on the right side.      Laboratory Review:     No results found for: PHART, PCO2A, PO2ART, HCO3A, BASEEXA, BASEDEFA, O2SATACAL  Lab Results   Component Value Date    HGB 12.2 (L) 02/03/2019    HCT 34.9 (L) 02/03/2019    PLTCT 102 (L) 02/03/2019    WBC 5.8 02/03/2019     Lab Results   Component Value Date    NA 136 (L) 02/03/2019    K 3.7 02/03/2019    CL 103 02/03/2019    CO2 27 02/03/2019    GAP 6 02/03/2019    BUN 17 02/03/2019    CR 0.74 02/03/2019    GLU 112 (H) 02/03/2019    CA 8.8 02/03/2019    TOTPROT 6.1 02/03/2019    AST 34 02/03/2019    ALT 34 02/03/2019    ALKPHOS 231 (H) 02/03/2019     No results found for: TNI, CKMB, MYOGLB  No results found for: PTT, INR, FIB, DIMER, FDP  No results found for: TSH, Oren Bracket  Lab Results   Component Value Date    CHOL 193 02/01/2019    TRIG 70 02/01/2019    HDL 60 02/01/2019    LDL 124 (H) 02/01/2019    VLDL 14 02/01/2019     No results found for: VANRAN, VANPK, VANTR  No results found for: CYCLOSPOR, TACROLIMUS, FREEPHENY     Point of Care Testing:  (Last 24 hours):  Glucose: (!) 112  POC Glucose (Download): (!) 157    Radiology and Other Diagnostics Review: review    Mitchell Lucero, MBChB   Neurology Resident

## 2019-02-03 NOTE — Consults
Oncology Consult Note      Admission Date: 01/31/2019                                                LOS: 3 days    Reason for Consult:  68 year old male presented to neurology service for dysarthria and episodes of right upper extremity shaking , likely convuslive TIA . Patient reported weight loss , CBC showed thrombocytopenia . Obtained CMP which showed Bili of 2 with elevated ALK and GGT . U/S of the abdomen with right hepatic mass , CT abdomen showed 3x3 right hepatic mass concerning for hepatocellular carcinoma with cirrohsis , splenomegaly and varices . For your kind assistance.    Consult type: Co-Management w/Signed Orders    Assessment/Plan        1. 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 Mild peritoneal thickening and enhancement, may be infectious peritonitis or metastatic disease. Prominent periportal, portacaval and epiphrenic LNs.     2. Dysarthria/RUE shaking  - 02/01/19 MRI brain- subacute appearing infarct in L lateral pons/cerebellar peduncle. Bilateral diffuse white matter disease in periventricular/subcortical areas as well as in brainstem. 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 Bilateral carotid athero and at vertebral origin.  - Echo- normal EF, moderate LA dilation, no thrombus/shunt.   - Routine EEG shows mild encephalopathy, no epileptiform activity  - Neuro following- attributes speech/arm movements to limb shaking TIAs (vs seizures, neurodegenerative condition)    Plan  - AFP (ordered)  - recommend IR consult for liver biopsy to confirm tissue diagnosis; patient would like to discuss with his wife before proceeding with biopsy  - Medical Oncology follow up requested with Dr. Tamsen Meek    Thank you for this consult. Please page with questions. Patient seen and discussed with Dr. Ramiro Harvest.  ______________________________________________________________________    History of Present Illness: Mitchell Lucero is a 68 y.o. male who presented to the [facility] on 01/31/19 with dysarthria and episodes of RUE shaking. He feels tired today. He is aware of the findings of a liver mass on imaging and is hopeful there is a good treatment for it. Denies history of heavy alcohol use.      No past medical history on file.  No past surgical history on file.  Social History     Socioeconomic History   ??? Marital status: Married     Spouse name: [name]   ??? Number of children: 3   ??? Years of education: Not on file   ??? Highest education level: Not on file   Occupational History   ??? Not on file   Social Needs   ??? Financial resource strain: Not on file   ??? Food insecurity     Worry: Not on file     Inability: Not on file   ??? Transportation needs     Medical: Not on file  Non-medical: Not on file   Tobacco Use   ??? Smoking status: Never Smoker   ??? Smokeless tobacco: Former Neurosurgeon     Types: Chew   Substance and Sexual Activity   ??? Alcohol use: Not on file   ??? Drug use: Not on file   ??? Sexual activity: Not on file   Lifestyle   ??? Physical activity     Days per week: Not on file     Minutes per session: Not on file   ??? Stress: Not on file   Relationships   ??? Social Wellsite geologist on phone: Not on file     Gets together: Not on file     Attends religious service: Not on file     Active member of club or organization: Not on file     Attends meetings of clubs or organizations: Not on file     Relationship status: Not on file   ??? Intimate partner violence     Fear of current or ex partner: Not on file     Emotionally abused: Not on file Physically abused: Not on file     Forced sexual activity: Not on file   Other Topics Concern   ??? Not on file   Social History Narrative    - Anterior cervical discectomy and fusion in 1997    - Used to work building metal tanks prior to retiring     No family history on file.  Allergies:  Patient has no known allergies.    Scheduled Meds:aspirin chewable tablet 81 mg, 81 mg, Oral, QDAY  atorvastatin (LIPITOR) tablet 80 mg, 80 mg, Oral, QHS  citalopram (CeleXA) tablet 20 mg, 20 mg, Oral, QDAY  clopiDOGrel (PLAVIX) tablet 75 mg, 75 mg, Oral, QDAY  heparin (porcine) PF syringe 5,000 Units, 5,000 Units, Subcutaneous, Q8H  insulin aspart U-100 (NOVOLOG FLEXPEN) injection PEN 0-6 Units, 0-6 Units, Subcutaneous, ACHS (22)  insulin glargine (LANTUS SOLOSTAR) injection PEN 10 Units, 10 Units, Subcutaneous, QHS  midodrine (PROAMATINE) tablet 2.5 mg, 2.5 mg, Oral, TID (02-23-16)  pantoprazole DR (PROTONIX) tablet 20 mg, 20 mg, Oral, QDAY(21)  vitamin A & D topical ointment, , Topical, BID    Continuous Infusions:  PRN and Respiratory Meds:loperamide QDAY PRN    Review of Systems:  Constitutional: +fatigue; negative for fevers, chills and weight loss  Eyes: negative for visual disturbance  ENT: negative for nasal congestion, epistaxis and sore throat  Respiratory: negative for cough, hemoptysis or dyspnea on exertion  Cardiovascular: negative for chest pain, chest pressure/discomfort, lower extremity edema  Gastrointestinal: negative for dysphagia, nausea, vomiting, diarrhea, constipation and abdominal pain  Genitourinary:negative for frequency and dysuria  Integument/breast: negative for rash  Hematologic/lymphatic: negative for bleeding  Musculoskeletal:negative for myalgias and back pain  Neurological: negative for headaches, dizziness and coordination problems  Behavioral/Psych: negative for altered mental status  Vital Signs:  Last Filed in 24 hours Vital Signs:  24 hour Range    BP: 138/79 (07/23 1001) Temp: 36.6 ???C (97.8 ???F) (07/23 1001)  Pulse: 85 (07/23 1001)  Respirations: 18 PER MINUTE (07/23 0836)  SpO2: 99 % (07/23 1001)  Height: 167.6 cm (66) (07/22 1400) BP: (116-179)/(66-91)   Temp:  [36.6 ???C (97.8 ???F)-37.1 ???C (98.8 ???F)]   Pulse:  [78-94]   Respirations:  [17 PER MINUTE-18 PER MINUTE]   SpO2:  [92 %-100 %]      Physical Exam:  General appearance: alert, cooperative and no distress  Head:  Normocephalic, without obvious abnormality, atraumatic  Eyes: PERRL  Throat: Lips, mucosa, and tongue normal. Teeth and gums normal  Lungs: breathing non labored  Abdomen: soft, non-tender.   Neurologic: Grossly normal    Lab/Radiology/Other Diagnostic Tests:  24-hour labs:    Results for orders placed or performed during the hospital encounter of 01/31/19 (from the past 24 hour(s))   IRON + BINDING CAPACITY + %SAT+ FERRITIN    Collection Time: 02/02/19  3:29 PM   Result Value Ref Range    Iron 104 50 - 185 MCG/DL    Iron Binding-TIBC 161 270 - 380 MCG/DL    % Saturation 33 28 - 42 %    Ferritin 160 30 - 300 NG/ML   RETICULOCYTE COUNT    Collection Time: 02/02/19  3:29 PM   Result Value Ref Range    Retic, Uncorrected 1.2 0.5 - 2.0 %    Retic, Corrected 1.0 %    Retic, Absolute 49.5 30 - 94 K/UL   COMPREHENSIVE METABOLIC PANEL    Collection Time: 02/02/19  3:29 PM   Result Value Ref Range    Sodium 137 137 - 147 MMOL/L    Potassium 3.9 3.5 - 5.1 MMOL/L    Chloride 104 98 - 110 MMOL/L    Glucose 171 (H) 70 - 100 MG/DL    Blood Urea Nitrogen 16 7 - 25 MG/DL    Creatinine 0.96 0.4 - 1.24 MG/DL    Calcium 8.8 8.5 - 04.5 MG/DL    Total Protein 6.7 6.0 - 8.0 G/DL    Total Bilirubin 2.2 (H) 0.3 - 1.2 MG/DL    Albumin 3.0 (L) 3.5 - 5.0 G/DL    Alk Phosphatase 409 (H) 25 - 110 U/L    AST (SGOT) 38 7 - 40 U/L    CO2 22 21 - 30 MMOL/L    ALT (SGPT) 41 7 - 56 U/L    Anion Gap 11 3 - 12    eGFR Non African American >60 >60 mL/min    eGFR African American >60 >60 mL/min   FOLATE, SERUM    Collection Time: 02/02/19  3:29 PM Result Value Ref Range    Serum Folate 12.1 >3.9 NG/ML   PERIPHERAL SMEAR    Collection Time: 02/02/19  3:29 PM   Result Value Ref Range    Peripheral Smear       MILD NORMOCYTIC ANEMIA AND MILD THROMBOCYTOPENIA ARE PRESENT, WITHOUT   SCHISTOCYTES.  WHITE CELLS ARE NORMAL IN NUMBER AND MORPHOLOGY.       Pathologist Signature       INTERPRETED BY Corinna Capra M.D.  By the PATH SIGNATURE ABOVE, I attest that I have personally formulated the   final interpretation expressed in this report and that the above diagnosis is   based upon my examination of the slides and/or other material indicated in this   report.     POC GLUCOSE    Collection Time: 02/02/19  5:19 PM   Result Value Ref Range    Glucose, POC 167 (H) 70 - 100 MG/DL   POC GLUCOSE    Collection Time: 02/02/19 10:32 PM   Result Value Ref Range    Glucose, POC 211 (H) 70 - 100 MG/DL   GGTP    Collection Time: 02/03/19  6:53 AM   Result Value Ref Range    GGTP 192 (H) 9 - 64 U/L   HEPATITIS PANEL, ACUTE    Collection Time: 02/03/19  6:53 AM  Result Value Ref Range    Hepatitis A IgM Non-Reactive NRCO-Non-Reactive    Anti HBc IgM  NRM-Non-Reactive: IgM antibodies to HBV core antigen (anti-H     Non-Reactive: IgM antibodies to HBV core antigen (anti-HBc) were not detected.    HBsAg Non-Reactive: HBs antigen not detected NRHB-Non-Reactive: HBs antigen not detected    Anti HCV Non-Reactive: Antibodies to HCV were not detected. NRHCV-Non-Reactive: Antibodies to HCV were not detected.   HEPATITIS B SURFACE AB    Collection Time: 02/03/19  6:53 AM   Result Value Ref Range    Anti HBs  NHBV-Negative: Individual is considered to be non-immune to     Negative: Individual is considered to be non-immune to HBV infection.   HEPATITIS B CORE AB TOT (IGG+IGM)    Collection Time: 02/03/19  6:53 AM   Result Value Ref Range    Anti HBc Total  NRHBV-Non-Reactive: Antibodies to HBV core antigen (anti-HBc     Non-Reactive: Antibodies to HBV core antigen (anti-HBc)were not detected.   COMPREHENSIVE METABOLIC PANEL    Collection Time: 02/03/19  6:53 AM   Result Value Ref Range    Sodium 136 (L) 137 - 147 MMOL/L    Potassium 3.7 3.5 - 5.1 MMOL/L    Chloride 103 98 - 110 MMOL/L    Glucose 112 (H) 70 - 100 MG/DL    Blood Urea Nitrogen 17 7 - 25 MG/DL    Creatinine 9.60 0.4 - 1.24 MG/DL    Calcium 8.8 8.5 - 45.4 MG/DL    Total Protein 6.1 6.0 - 8.0 G/DL    Total Bilirubin 2.0 (H) 0.3 - 1.2 MG/DL    Albumin 2.6 (L) 3.5 - 5.0 G/DL    Alk Phosphatase 098 (H) 25 - 110 U/L    AST (SGOT) 34 7 - 40 U/L    CO2 27 21 - 30 MMOL/L    ALT (SGPT) 34 7 - 56 U/L    Anion Gap 6 3 - 12    eGFR Non African American >60 >60 mL/min    eGFR African American >60 >60 mL/min   BILIRUBIN, DIRECT    Collection Time: 02/03/19  6:53 AM   Result Value Ref Range    Bilirubin, Direct 0.8 (H) <0.4 MG/DL   CBC    Collection Time: 02/03/19  6:53 AM   Result Value Ref Range    White Blood Cells 5.8 4.5 - 11.0 K/UL    RBC 3.94 (L) 4.4 - 5.5 M/UL    Hemoglobin 12.2 (L) 13.5 - 16.5 GM/DL    Hematocrit 11.9 (L) 40 - 50 %    MCV 88.8 80 - 100 FL    MCH 31.0 26 - 34 PG    MCHC 34.9 32.0 - 36.0 G/DL    RDW 14.7 (H) 11 - 15 %    Platelet Count 102 (L) 150 - 400 K/UL    MPV 8.1 7 - 11 FL   POC GLUCOSE    Collection Time: 02/03/19  7:38 AM   Result Value Ref Range    Glucose, POC 120 (H) 70 - 100 MG/DL   POC GLUCOSE    Collection Time: 02/03/19 11:48 AM   Result Value Ref Range    Glucose, POC 157 (H) 70 - 100 MG/DL     Pertinent radiology reviewed.    Thora Lance, APRN  [id number]

## 2019-02-03 NOTE — Consults
Gastroenterology Consult Note  Patient Name:Mitchell Lucero         ZOX:[id number]  Admission Date: 01/31/2019  6:08 PM      Principal Problem:    TIA (transient ischemic attack)      History of Present Illness/Subjective:  Mitchell Lucero is a 68 y.o. male with history of uncontrolled type 2 diabetes mellitus, hypertension, pontine stroke in 12/2018 transferred to [place] for dysarthria and involuntary limb movements, worsening falls and found to have a liver mass with cirrhosis.    It was our pleasure to meet the patient and his wife in the hospital.  They come from the [place], [place] area.  The patient had been found to demonstrate significant weight loss, about 40 pounds within the last calendar year.  He also was found to have elevated alkaline phosphatase and so an ultrasound was performed.  It found a hypoechoic liver mass which is consistent with HCC on CT scan.  Patient also had nodular liver and stigmata of cirrhosis on exam and lab testing.    He has never been told he has any history of liver disease but admittedly had not been followed with a doctor for some time.  Several years ago  he was diagnosed with poorly controlled diabetes which had been suspected to have been present long before diagnosis.  He is disabled due to cervical spine problems and used to be a Psychologist, occupational.  He denies any excessive use of alcohol or high risk for viral hepatitis.  Ever since his stroke in June he has been exceptionally weak and having problems with postural dizziness and falls.    Assessment/ Plan:    1. Hepatocellular carcinoma. New diagnosis.  CT liver mass protocol shows enhancing hepatic dome in segment 7/8 lesion measuring 3.4 cm (LIRADS 5), prominent periportal and portacaval lymph nodes  2. 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  Hepatitis panel negative.  Minimal ascites, no lower extremity edema or encephalopathy.  Never had EGD.    Recommendations: New hepatocellular carcinoma likely within [place], significant foreseeable barriers to transplant.     -Send INR, AFP, AMA, ANA, ASMA, iron studies with ferritin, alpha-1 antitrypsin level  -If problems with postural dizziness (orthostasis) suggest increasing midodrine to 10 mg 3 times daily  -Patient's case will be discussed at the multidisciplinary tumor board on Monday.  His outpatient follow-up will be coordinated from the outcome of that meeting.  Given his functional decline and stroke status he does not appear to be an optimal transplant candidate and has significant portal hypertension so resection is also unlikely to be an option.  These are the only curative options for his condition we shared that with the patient and his wife.  However, he would be a good candidate for local regional therapies such as TACE and or microwave ablation.   -Outpatient EGD is nonurgent and can be coordinated from clinic  -No additional evaluation required inpatient    Patient seen/discussed with Dr. Rickard Rhymes Marthenia Rolling, MD  GI/Hepatology Fellow [phone]    -----------------------------  PMH:  No past medical history on file.    Current medications:  No current facility-administered medications on file prior to encounter.      Current Outpatient Medications on File Prior to Encounter   Medication Sig Dispense Refill   ??? aspirin EC 81 mg tablet Take 81 mg by mouth every morning. Take with food.     ??? atorvastatin (LIPITOR) 80 mg tablet Take 80 mg by  mouth at bedtime daily.     ??? Bimatoprost (LUMIGAN) 0.01 % drop Apply 1 drop to both eyes at bedtime daily.     ??? citalopram (CELEXA) 40 mg tablet Take 20 mg by mouth every morning.     ??? clopiDOGrel (PLAVIX) 75 mg tablet Take 75 mg by mouth at bedtime daily.     ??? insulin detemir U-100(+) (LEVEMIR) 100 unit/mL vial Inject 10 Units under the skin at bedtime daily.     ??? insulin regular (NOVOLIN R) 100 unit/mL injection Inject  under the skin three times daily as needed. Sliding scale     ??? lisinopriL (ZESTRIL) 2.5 mg tablet Take 2.5 mg by mouth every morning.     ??? loperamide (IMODIUM A-D) 2 mg capsule Take 2 mg by mouth as Needed for Diarrhea. Take 2 capsules by mouth initially, followed by 1 capsule by mouth after each loose stool up to a maximum of 8 tablets in 24 hours.     ??? omeprazole DR (PRILOSEC) 20 mg capsule Take 20 mg by mouth daily before breakfast.     ??? tamsulosin (FLOMAX) 0.4 mg capsule Take 0.4 mg by mouth at bedtime daily. Do not crush, chew or open capsules. Take 30 minutes following the same meal each day.         PSH:  No past surgical history on file.    SH:  Social History     Socioeconomic History   ??? Marital status: Married     Spouse name: Mitchell Lucero   ??? Number of children: 3   ??? Years of education: Not on file   ??? Highest education level: Not on file   Occupational History   ??? Not on file   Tobacco Use   ??? Smoking status: Never Smoker   ??? Smokeless tobacco: Former Neurosurgeon     Types: Chew   Substance and Sexual Activity   ??? Alcohol use: Not on file   ??? Drug use: Not on file   ??? Sexual activity: Not on file   Other Topics Concern   ??? Not on file   Social History Narrative    - Anterior cervical discectomy and fusion in 1997    - Used to work building metal tanks prior to retiring       FH:  No family history on file.    Review of Systems:  Constitutional: No fevers, chills, + weight loss  Eyes: No vision deficit, no icterus  Ears, nose, mouth: No oral bleeding, ulcer  Cardiovascular: No chest pain, palpitations  Respiratory: No dyspnea, cough   Gastrointestinal: No abdominal pain, blood in stool  Musculoskeltal: No joint inflammation, new deformity  Integumentary: No rashes, exudate  Neurologic: No cognitive change, focal weakness  Hematologic: No for easy bleeding or bruising  Please see HPI for additional pertinent documentation    Physical Exam:  Vitals:    02/03/19 0956 02/03/19 0959 02/03/19 1001 02/03/19 1355 BP: (!) 155/86 (!) 162/86 138/79 (!) 159/84   BP Source: Arm, Right Upper Arm, Right Upper Arm, Right Upper Arm, Right Upper   Pulse: 81 80 85 79   Temp:   36.6 ???C (97.8 ???F) 36.7 ???C (98.1 ???F)   SpO2: 99% 100% 99% 99%   Weight:       Height:         Constitutional- Vitals above, no acute distress.  Appears older than stated age  Head - Normocephalic, atraumatic.   Eyes -  EOMI grossly. No icterus  or injection.   Ears, nose, mouth, throat- No oral ulcer or bleeding.   Neck - No swelling or tracheal deviation.   Respiratory - Symmetric chest rise, no increased work of breathing.  Cardiovascular - Peripheral pulses intact, no pedal edema.  Gastrointestinal- Non-TTP. No hepatospenomegaly.   Skin - No exposed rash, lesion.  Neurologic - No CN deficit, normal fluid speech  Psychiatric - Judgement intact, thought content appropriate.    Labs/Imaging:  Pertient labs/imaging were reviewed on initiation of progress note.

## 2019-02-03 NOTE — Progress Notes
PHYSICAL THERAPY  PROGRESS NOTE        Name: Mitchell Lucero        MRN: [id number]          DOB: Oct 18, 1950          Age: 68 y.o.  Admission Date: 01/31/2019             LOS: 3 days        Mobility  Patient Turn/Position: Chair  Progressive Mobility Level: Walk in room  Distance Walked (feet): 15 ft  Level of Assistance: Assist X1  Assistive Device: Walker  Time Tolerated: 11-30 minutes  Activity Limited By: Weakness    Subjective  Significant hospital events: 68 y.o male who transferred to [place] for a transient episode of dysarthria associated with right arm large amplitude irregular involuntary movement and both leg shaking with retained awareness. Pt has been having increasing number of falls at home. 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  Mental / Cognitive Status: Alert;Oriented;Cooperative  Persons Present: Spouse  Pain: Patient has no complaint of pain  Pain Interventions: Patient agrees to participate in therapy  Comments: Ted hose and abdominal binder donned prior to upright mobility at this time.   Ambulation Assist: Assist Needed with Mobility-Related ADL's/Ambulation  Patient Owned Equipment: Nurse, adult  Home Situation: Lives with Family(Spouse and mother)  Type of Home: House(Split-level)  Entry Stairs: Stair Glide  In-Home Stairs: Stair Glide  Comments: Patient observed ambulating in the room this date. Politely declines further activity, not due to fatigue, but to recently receiving new CA diagnosis a short while ago. PT provided listening and supportive ear. Spouse encouraging patient to ambulate to maximize mobility, but patient questions whether or not it will be worth it with this recent news.      Strength  Strength Position Assessed: Seated  Overall Strength: Able to Move All Joints Independently Through Available ROM;Generalized Weakness    Bed Mobility/Transfer Comments: Patient up in bedside chair at beginning and end of session. Bed mobility not assessed this date.   Transfer Type: Sit to Stand  Transfer: Assistance Level: From;Bed Side Chair;Minimal Assist  Transfer: Assistive Device: Nurse, adult  Transfers: Type Of Assistance: For Safety Considerations;For Strength Deficit;For Balance;Verbal Cues  Other Transfer Type: Sit to/from Stand  Other Transfer: Assistance Level: To/From;Toilet;Minimal Assist  Other Transfer: Assistive Device: Nurse, adult  Other Transfer: Type Of Assistance: For Safety Considerations;For Strength Deficit;For Balance;Verbal Cues  End Of Activity Status: Up in Chair;Nursing Notified;Instructed Patient to Use Call Light;Instructed Patient to Request Assist with Mobility    Gait  Gait Distance: 15 feet  Gait: Assistance Level: Minimal Assist;Safety Considerations  Gait: Assistive Device: Roller Walker  Gait: Descriptors: Variable step length;No balance loss;Forward trunk flexion  Comments: Abdominal binder and ted hose donned prior to walk to the restroom.   Activity Limited By: Weakness    Education  Persons Educated: Patient/Family  Patient Barriers To Learning: None Noted  Teaching Methods: Verbal Instruction  Patient Response: Verbalized Understanding;More Instruction Required  Topics: Plan/Goals of PT Interventions;Use of Assistive Device/Orthosis;Mobility Progression;Safety Awareness;Up with Assist Only;Importance of Increasing Activity;Recommend Continued Therapy    Assessment/Progress  Impaired Mobility Due To: Decreased Strength;Impaired Balance;Decreased Activity Tolerance;Safety Concerns  Assessment/Progress: Should Improve w/ Continued PT    AM-PAC 6 Clicks Basic Mobility Inpatient  Turning from your back to your side while in a flat bed without using bed rails: A Little  Moving from lying on  your back to sitting on the side of a flatbed without using bedrails : A Little Moving to and from a bed to a chair (including a wheelchair): A Little  Standing up from a chair using your arms (e.g. wheelchair, or bedside chair): A Little  To walk in hospital room: A Little  Climbing 3-5 steps with a railing: A Lot  Raw Score: 17  Standardized (T-scale) Score: 39.67  Basic Mobility CMS 0-100%: 43.83  CMS G Code Modifier for Basic Mobility: CK    Goals  Goal Formulation: With Patient/Family  Time For Goal Achievement: 5 days, To, 7 days  Patient Will Go Supine To/From Sit: w/ Stand By Assist  Patient Will Transfer Bed/Chair: w/ Stand By Assist  Patient Will Transfer Sit to Stand: w/ Stand By Assist  Patient Will Ambulate: 51-100 Feet, w/ Dan Humphreys, w/ Stand By Assist    Plan  Treatment Interventions: Mobility Training;Strengthening  Plan Frequency: 5 Days per Week  PT Plan for Next Visit: Progress proximal LE strengthening and consider 5x sit<>stand as standardized outcome for assessing. Progress gait distance with roller walker as able.     PT Discharge Recommendations  Recommendation: Inpatient setting;Recommend rehab medicine consult  Patient Currently Requires Physical Assist With: All mobility    Therapist: Edward Jolly, PT, DPT [phone]  Date: 02/03/2019

## 2019-02-03 NOTE — Progress Notes
OCCUPATIONAL THERAPY  PROGRESS NOTE      Name: Mitchell Lucero        MRN: [id number]          DOB: [birth year]          Age: 67 y.o.  Admission Date: 01/31/2019             LOS: 3 days      Mobility  Progressive Mobility Level: Walk in hallway  Distance Walked (feet): 200 ft  Level of Assistance: Assist X1(+chair follow)    Subjective  Pertinent Dx per Physician: 68 y.o male who transferred to [place] for a transient episode of dysarthria associated with right arm large amplitude irregular involuntary movement and both leg shaking with retained awareness. Pt has been having increasing number of falls at home. 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  Precautions: Falls  Pain / Complaints: Patient agrees to participate in therapy;Patient has no c/o pain    Objective  Psychosocial Status: Willing and Cooperative to Participate  Persons Present: Spouse    Home Living  Type of Home: House(Split-level)  Home Layout: Two Level;Bed/Bath Upstairs  Financial risk analyst / Tub: Pension scheme manager: Standard  Bathroom Equipment: Engineer, materials in Air traffic controller;Shower Chair  Home Equipment: [employer] - one upstairs w/ tray & one downstairs;Stair glide)    Prior Function  Level Of Independence: Independent with ADLs and functional transfers  Lives With: Spouse(Mother)  Receives Help From: Family    ADL's  Comment: Pt reports completing ADLs prior to session this date.     ADL Mobility  Transfer Type: Sit to/from stand  Transfer: Assistance Level: Minimal assist  Transfer: Assistive Device: Roller walker  Transfer: Type of Assistance: For safety considerations;For balance;For strength deficit  End of Activity Status: Up in chair;Instructed patient to request assist with mobility(TABS alarm on)  Gait Distance: 200 feet  Gait: Assistance Level: Minimal assist;Maximum assist  Gait: Assistive Device: Roller walker  Gait Comments: Pt handed mask to don prior to exiting room. Pt takes hands off of RW to do this and reports he covered his eyes with mask which made him lose his balance--pt's knees are also noted to buckle and he begins to fall forward. Pt is able to safely regain upright stance with MaxA. Gait continued with chair follow for safety. Pt noted to have decreased step length and requires cues to keep RW closer to body.     Activity Tolerance  Endurance: 3/5 Tolerates 25-30 Minutes Exercise w/Multiple Rests    Cognition  Expression: Increased Time for Expression;Mumbling/Slurred(Per spouse, he is at his new baseline since prior stroke)  Problem Solving: Decreased Judgment/Safety  Orientation: Alert & Oriented x4  Attention: Awake/Alert    Assessment  Assessment: Decreased ADL Status;Decreased Safe/Judg during ADL;Decreased Cognition;Decreased Endurance;Decreased Self-Care Trans;Decreased High-Level ADLs  Prognosis: Good;w/Cont OT s/p Acute Discharge    Plan  OT Frequency: 5x/week  OT Plan for Next Visit: Standing at sink for grooming; LE dressing at EOB    ADL Goals  Patient Will Perform All ADL's: w/ Stand By Assist;w/Adaptive Equipment;w/ Good Judgment/Safety    Functional Transfer Goals  Pt Will Perform All Functional Transfers: w/ Stand By Assist, w/ Assistive Devices, w/ Good Judgment/Safety    OT Discharge Recommendations  Recommendation: Inpatient setting;Recommend rehab medicine consult  Patient Currently Requires Physical Assist With: All mobility;All personal care ADLs;All home functioning ADLs    Therapist: Mosetta Putt, OTR/L [id number]  Date:  02/03/2019

## 2019-02-03 NOTE — Care Plan
Problem: Neurological Status, Impaired/Altered  Goal: Progress toward maximizing functional outcomes  Flowsheets (Taken 02/03/2019 0149)  Progress toward maximizing functional outcomes:   Manage environmental safety   Promote functional status   Observe patient activities   Establish therapeutic relationship   Provide safety precautions education  Note: Encouraged patient to participate in own care. Maintained a safe environment for patient.     Problem: Neurological Status, Impaired/Altered  Goal: Cognitive status restored to baseline  Flowsheets (Taken 02/03/2019 0149)  Cognitive status restored to baseline:   Assess patient history   Observe patient acitvities   Complete delirium assessment scale   Promote rest

## 2019-02-04 ENCOUNTER — Encounter: Admit: 2019-02-04 | Discharge: 2019-02-04

## 2019-02-04 ENCOUNTER — Encounter: Admit: 2019-02-03 | Discharge: 2019-02-03 | Attending: Neurology | Admitting: Neurology

## 2019-02-04 ENCOUNTER — Encounter: Admit: 2019-01-31 | Discharge: 2019-01-31 | Attending: Neurology | Admitting: Neurology

## 2019-02-04 ENCOUNTER — Encounter: Admit: 2019-02-01 | Discharge: 2019-02-01 | Attending: Neurology | Admitting: Neurology

## 2019-02-04 DIAGNOSIS — E1140 Type 2 diabetes mellitus with diabetic neuropathy, unspecified: Secondary | ICD-10-CM

## 2019-02-04 DIAGNOSIS — C220 Liver cell carcinoma: Secondary | ICD-10-CM

## 2019-02-04 DIAGNOSIS — R188 Other ascites: Secondary | ICD-10-CM

## 2019-02-04 DIAGNOSIS — K766 Portal hypertension: Secondary | ICD-10-CM

## 2019-02-04 DIAGNOSIS — K7460 Unspecified cirrhosis of liver: Secondary | ICD-10-CM

## 2019-02-04 DIAGNOSIS — G459 Transient cerebral ischemic attack, unspecified: Principal | ICD-10-CM

## 2019-02-04 DIAGNOSIS — D696 Thrombocytopenia, unspecified: Secondary | ICD-10-CM

## 2019-02-04 DIAGNOSIS — M4802 Spinal stenosis, cervical region: Secondary | ICD-10-CM

## 2019-02-04 DIAGNOSIS — E1165 Type 2 diabetes mellitus with hyperglycemia: Secondary | ICD-10-CM

## 2019-02-04 DIAGNOSIS — L89512 Pressure ulcer of right ankle, stage 2: Secondary | ICD-10-CM

## 2019-02-04 DIAGNOSIS — R471 Dysarthria and anarthria: Secondary | ICD-10-CM

## 2019-02-04 DIAGNOSIS — I951 Orthostatic hypotension: Secondary | ICD-10-CM

## 2019-02-04 DIAGNOSIS — Z87891 Personal history of nicotine dependence: Secondary | ICD-10-CM

## 2019-02-04 DIAGNOSIS — K7581 Nonalcoholic steatohepatitis (NASH): Secondary | ICD-10-CM

## 2019-02-04 DIAGNOSIS — Z794 Long term (current) use of insulin: Secondary | ICD-10-CM

## 2019-02-04 DIAGNOSIS — Z1159 Encounter for screening for other viral diseases: Secondary | ICD-10-CM

## 2019-02-04 DIAGNOSIS — I672 Cerebral atherosclerosis: Secondary | ICD-10-CM

## 2019-02-04 DIAGNOSIS — I10 Essential (primary) hypertension: Secondary | ICD-10-CM

## 2019-02-04 DIAGNOSIS — Z981 Arthrodesis status: Secondary | ICD-10-CM

## 2019-02-04 DIAGNOSIS — R296 Repeated falls: Secondary | ICD-10-CM

## 2019-02-04 DIAGNOSIS — G8191 Hemiplegia, unspecified affecting right dominant side: Secondary | ICD-10-CM

## 2019-02-04 DIAGNOSIS — E119 Type 2 diabetes mellitus without complications: Secondary | ICD-10-CM

## 2019-02-04 DIAGNOSIS — R634 Abnormal weight loss: Secondary | ICD-10-CM

## 2019-02-04 DIAGNOSIS — R251 Tremor, unspecified: Secondary | ICD-10-CM

## 2019-02-04 DIAGNOSIS — I6350 Cerebral infarction due to unspecified occlusion or stenosis of unspecified cerebral artery: Secondary | ICD-10-CM

## 2019-02-04 DIAGNOSIS — E1169 Type 2 diabetes mellitus with other specified complication: Secondary | ICD-10-CM

## 2019-02-04 DIAGNOSIS — R69 Illness, unspecified: Secondary | ICD-10-CM

## 2019-02-04 LAB — CBC
Lab: 15 % — ABNORMAL HIGH (ref >60)
Lab: 3.7 M/UL — ABNORMAL LOW (ref 4.4–5.5)
Lab: 31 pg (ref 26–34)
Lab: 33 % — ABNORMAL LOW (ref 40–50)
Lab: 34 g/dL (ref >60)
Lab: 6 K/UL — AB (ref 4.5–11.0)
Lab: 8 FL (ref 7–11)
Lab: 89 FL (ref 80–100)
Lab: 97 K/UL — ABNORMAL LOW (ref >60)

## 2019-02-04 LAB — ANTI-NUCLEAR ANTIBODY(ANA): Lab: <80 {titer} (ref <80)

## 2019-02-04 LAB — POC GLUCOSE
Lab: 141 mg/dL — ABNORMAL HIGH (ref 70–100)
Lab: 133 mg/dL — ABNORMAL HIGH (ref 70–100)
Lab: 109 mg/dL — ABNORMAL HIGH (ref 70–100)
Lab: 163 mg/dL — ABNORMAL HIGH (ref 70–100)

## 2019-02-04 LAB — ANTI-MITOCHONDRIAL ANTIBODY: Lab: <20 {titer} (ref <20)

## 2019-02-04 LAB — BASIC METABOLIC PANEL
Lab: 140 MMOL/L — AB (ref 137–147)
Lab: 3.7 MMOL/L (ref 3.5–5.1)

## 2019-02-04 LAB — PROTIME INR (PT): Lab: 1.2 g/dL — ABNORMAL LOW (ref 0.8–1.2)

## 2019-02-04 LAB — ALPHA FETO PROTEIN (AFP): Lab: 2.3 ng/mL — AB (ref 0.0–15.0)

## 2019-02-04 LAB — HEPATITIS C ANTIBODY W REFLEX HCV PCR QUANT

## 2019-02-04 MED ORDER — ACETAMINOPHEN 325 MG PO TAB
650 mg | ORAL | 0 refills | Status: DC | PRN
Start: 2019-02-04 — End: 2019-02-21
  Administered 2019-02-10 – 2019-02-13 (×2): 650 mg via ORAL

## 2019-02-04 MED ORDER — MAGNESIUM HYDROXIDE 2,400 MG/10 ML PO SUSP
10 mL | ORAL | 0 refills | Status: DC | PRN
Start: 2019-02-04 — End: 2019-02-21

## 2019-02-04 MED ORDER — MIDODRINE 2.5 MG PO TAB
1.25 mg | Freq: Three times a day (TID) | ORAL | 0 refills | Status: CN
Start: 2019-02-04 — End: ?

## 2019-02-04 MED ORDER — CLOPIDOGREL 75 MG PO TAB
75 mg | Freq: Every day | ORAL | 0 refills | Status: DC
Start: 2019-02-04 — End: 2019-02-21
  Administered 2019-02-05 – 2019-02-21 (×17): 75 mg via ORAL

## 2019-02-04 MED ORDER — ASPIRIN 81 MG PO CHEW
81 mg | Freq: Every day | ORAL | 0 refills | Status: DC
Start: 2019-02-04 — End: 2019-02-21
  Administered 2019-02-05 – 2019-02-21 (×17): 81 mg via ORAL

## 2019-02-04 MED ORDER — ONDANSETRON HCL 4 MG PO TAB
4 mg | ORAL | 0 refills | Status: DC | PRN
Start: 2019-02-04 — End: 2019-02-21
  Administered 2019-02-08 – 2019-02-21 (×2): 4 mg via ORAL

## 2019-02-04 MED ORDER — PANTOPRAZOLE 20 MG PO TBEC
20 mg | Freq: Every day | ORAL | 0 refills | Status: DC
Start: 2019-02-04 — End: 2019-02-21
  Administered 2019-02-05 – 2019-02-21 (×17): 20 mg via ORAL

## 2019-02-04 MED ORDER — HYDROCORTISONE 1 % TP CREA
TOPICAL | 0 refills | Status: DC
  Administered 2019-02-06: 02:00:00 via TOPICAL

## 2019-02-04 MED ORDER — VITS A AND D-WHITE PET-LANOLIN TP OINT
Freq: Two times a day (BID) | TOPICAL | 1 refills | Status: DC
Start: 2019-02-04 — End: 2019-02-21

## 2019-02-04 MED ORDER — LACTULOSE 10 GRAM/15 ML PO SOLN WRAPPER
30 mL | Freq: Three times a day (TID) | ORAL | 0 refills | Status: DC | PRN
Start: 2019-02-04 — End: 2019-02-04

## 2019-02-04 MED ORDER — CLOPIDOGREL 75 MG PO TAB
ORAL_TABLET | Freq: Every day | ORAL | 0 refills | 90.00000 days | Status: DC
Start: 2019-02-04 — End: 2019-02-04

## 2019-02-04 MED ORDER — VITS A AND D-WHITE PET-LANOLIN TP OINT
Freq: Two times a day (BID) | TOPICAL | 0 refills | Status: DC
Start: 2019-02-04 — End: 2019-02-21
  Administered 2019-02-05: 03:00:00 via TOPICAL

## 2019-02-04 MED ORDER — INSULIN GLARGINE 100 UNIT/ML (3 ML) SC INJ PEN
10 [IU] | Freq: Every evening | SUBCUTANEOUS | 0 refills | Status: CN
Start: 2019-02-04 — End: ?

## 2019-02-04 MED ORDER — LACTULOSE 10 GRAM/15 ML PO SOLN WRAPPER
30 mL | Freq: Three times a day (TID) | ORAL | 0 refills | Status: DC
Start: 2019-02-04 — End: 2019-02-04
  Administered 2019-02-04: 17:00:00 20 g via ORAL

## 2019-02-04 MED ORDER — CLOPIDOGREL 75 MG PO TAB
75 mg | ORAL_TABLET | Freq: Every day | ORAL | 0 refills | 90.00000 days | Status: AC
Start: 2019-02-04 — End: ?

## 2019-02-04 MED ORDER — CLOPIDOGREL 75 MG PO TAB
ORAL_TABLET | ORAL | 0 refills | 90.00000 days | Status: DC
Start: 2019-02-04 — End: 2019-02-04

## 2019-02-04 MED ORDER — ATORVASTATIN 40 MG PO TAB
80 mg | Freq: Every evening | ORAL | 0 refills | Status: CN
Start: 2019-02-04 — End: ?

## 2019-02-04 MED ORDER — INSULIN GLARGINE 100 UNIT/ML (3 ML) SC INJ PEN
10 [IU] | Freq: Every evening | SUBCUTANEOUS | 0 refills | Status: DC
Start: 2019-02-04 — End: 2019-02-10
  Administered 2019-02-05: 03:00:00 10 [IU] via SUBCUTANEOUS

## 2019-02-04 MED ORDER — MIDODRINE 2.5 MG PO TAB
1.25 mg | Freq: Three times a day (TID) | ORAL | 0 refills | Status: DC
Start: 2019-02-04 — End: 2019-02-04
  Administered 2019-02-04: 17:00:00 1.25 mg via ORAL

## 2019-02-04 MED ORDER — DOCUSATE SODIUM 100 MG PO CAP
100 mg | Freq: Two times a day (BID) | ORAL | 0 refills | Status: DC
Start: 2019-02-04 — End: 2019-02-21
  Administered 2019-02-05 – 2019-02-21 (×30): 100 mg via ORAL

## 2019-02-04 MED ORDER — BISACODYL 10 MG RE SUPP
10 mg | Freq: Every day | RECTAL | 0 refills | Status: DC | PRN
Start: 2019-02-04 — End: 2019-02-04

## 2019-02-04 MED ORDER — CITALOPRAM 20 MG PO TAB
20 mg | Freq: Every day | ORAL | 0 refills | Status: DC
Start: 2019-02-04 — End: 2019-02-21
  Administered 2019-02-05 – 2019-02-21 (×17): 20 mg via ORAL

## 2019-02-04 MED ORDER — INSULIN ASPART 100 UNIT/ML SC FLEXPEN
0-6 [IU] | Freq: Before meals | SUBCUTANEOUS | 0 refills | Status: DC
Start: 2019-02-04 — End: 2019-02-21
  Administered 2019-02-05: 17:00:00 1 [IU] via SUBCUTANEOUS
  Administered 2019-02-17: 17:00:00 2 [IU] via SUBCUTANEOUS

## 2019-02-04 MED ORDER — LACTULOSE 10 GRAM/15 ML PO SOLN WRAPPER
20 g | Freq: Three times a day (TID) | ORAL | 1 refills | 21.00000 days | Status: DC
Start: 2019-02-04 — End: 2019-02-21

## 2019-02-04 MED ORDER — INSULIN ASPART 100 UNIT/ML SC FLEXPEN
0-6 [IU] | Freq: Before meals | SUBCUTANEOUS | 0 refills | Status: CN
Start: 2019-02-04 — End: ?

## 2019-02-04 MED ORDER — LACTULOSE 10 GRAM/15 ML PO SOLN WRAPPER
30 mL | Freq: Three times a day (TID) | ORAL | 0 refills | Status: DC
Start: 2019-02-04 — End: 2019-02-21
  Administered 2019-02-04 – 2019-02-21 (×41): 20 g via ORAL

## 2019-02-04 MED ORDER — HEPARIN, PORCINE (PF) 5,000 UNIT/0.5 ML IJ SYRG
5000 [IU] | SUBCUTANEOUS | 0 refills | Status: CN
Start: 2019-02-04 — End: ?

## 2019-02-04 MED ORDER — MIDODRINE 2.5 MG PO TAB
1.25 mg | Freq: Three times a day (TID) | ORAL | 0 refills | Status: DC
Start: 2019-02-04 — End: 2019-02-09
  Administered 2019-02-05 – 2019-02-09 (×13): 1.25 mg via ORAL

## 2019-02-04 MED ORDER — HEPARIN, PORCINE (PF) 5,000 UNIT/0.5 ML IJ SYRG
5000 [IU] | SUBCUTANEOUS | 0 refills | Status: DC
Start: 2019-02-04 — End: 2019-02-21
  Administered 2019-02-04 – 2019-02-21 (×50): 5000 [IU] via SUBCUTANEOUS

## 2019-02-04 MED ORDER — MIDODRINE 2.5 MG PO TAB
1.25 mg | ORAL_TABLET | Freq: Two times a day (BID) | ORAL | 1 refills | 30.00000 days | Status: DC
Start: 2019-02-04 — End: 2019-02-21

## 2019-02-04 MED ORDER — ASPIRIN 81 MG PO CHEW
81 mg | Freq: Every day | ORAL | 0 refills | Status: CN
Start: 2019-02-04 — End: ?

## 2019-02-04 MED ORDER — SENNOSIDES 8.6 MG PO TAB
2 | Freq: Every evening | ORAL | 0 refills | Status: DC
Start: 2019-02-04 — End: 2019-02-21
  Administered 2019-02-05 – 2019-02-21 (×15): 2 via ORAL

## 2019-02-04 MED ORDER — MELATONIN 3 MG PO TAB
3 mg | Freq: Every evening | ORAL | 0 refills | Status: DC
Start: 2019-02-04 — End: 2019-02-21
  Administered 2019-02-05 – 2019-02-21 (×17): 3 mg via ORAL

## 2019-02-04 MED ORDER — ALUMINUM-MAGNESIUM HYDROXIDE 200-200 MG/5 ML PO SUSP
30 mL | ORAL | 0 refills | Status: DC | PRN
Start: 2019-02-04 — End: 2019-02-21

## 2019-02-04 MED ORDER — PANTOPRAZOLE 20 MG PO TBEC
20 mg | ORAL_TABLET | Freq: Every day | ORAL | 0 refills | 90.00000 days | Status: DC
Start: 2019-02-04 — End: 2019-02-21

## 2019-02-04 MED ORDER — TRAZODONE 50 MG PO TAB
50 mg | Freq: Every evening | ORAL | 0 refills | Status: DC | PRN
Start: 2019-02-04 — End: 2019-02-10
  Administered 2019-02-05 – 2019-02-06 (×2): 50 mg via ORAL

## 2019-02-04 MED ORDER — CITALOPRAM 20 MG PO TAB
20 mg | Freq: Every day | ORAL | 0 refills | Status: CN
Start: 2019-02-04 — End: ?

## 2019-02-04 MED ORDER — PANTOPRAZOLE 20 MG PO TBEC
20 mg | Freq: Every day | ORAL | 0 refills | Status: CN
Start: 2019-02-04 — End: ?

## 2019-02-04 MED ORDER — VITS A AND D-WHITE PET-LANOLIN TP OINT: TOPICAL | 0 refills | Status: CN

## 2019-02-04 MED ORDER — LACTULOSE 10 GRAM/15 ML PO SOLN WRAPPER
30 mL | Freq: Three times a day (TID) | ORAL | 0 refills | Status: CN
Start: 2019-02-04 — End: ?

## 2019-02-04 MED ORDER — ATORVASTATIN 40 MG PO TAB
80 mg | Freq: Every evening | ORAL | 0 refills | Status: DC
Start: 2019-02-04 — End: 2019-02-21
  Administered 2019-02-05 – 2019-02-21 (×17): 80 mg via ORAL

## 2019-02-04 NOTE — Progress Notes
PHYSICAL THERAPY  PROGRESS NOTE        Name: Mitchell Lucero        MRN: [id number]          DOB: 04/07/1951          Age: 68 y.o.  Admission Date: 01/31/2019             LOS: 4 days        Mobility  Patient Turn/Position: Chair  Progressive Mobility Level: Walk in room  Distance Walked (feet): 60 ft  Level of Assistance: Assist X1  Assistive Device: Walker  Time Tolerated: 11-30 minutes  Activity Limited By: Weakness    Subjective  Significant hospital events: 68 y.o male who transferred to [place] for a transient episode of dysarthria associated with right arm large amplitude irregular involuntary movement and both leg shaking with retained awareness. Pt has been having increasing number of falls at home. 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  Mental / Cognitive Status: Alert;Oriented;Cooperative  Persons Present: Spouse  Pain: Patient has no complaint of pain  Pain Interventions: Patient agrees to participate in therapy  Comments: Ted hose and abdominal binder donned prior to upright mobility at this time.   Ambulation Assist: Assist Needed with Mobility-Related ADL's/Ambulation  Patient Owned Equipment: Nurse, adult  Home Situation: Lives with Family(Spouse and mother)  Type of Home: House(Split-level)  Entry Stairs: Stair Glide  In-Home Stairs: Stair Glide  Comments: Patient agreeable to work with PT this morning. Reports he will be going to IR at some point today for biopsy of new liver mass.     Strength  Strength Position Assessed: Seated  Overall Strength: Able to Move All Joints Independently Through Available ROM;Generalized Weakness    Bed Mobility/Transfer  Comments: Patient up in bedside chair at beginning and end of session. Bed mobility not assessed this date.   Transfer Type: Sit to Stand  Transfer: Assistance Level: From;Bed Side Chair;Minimal Assist  Transfer: Assistive Device: Nurse, adult Transfers: Type Of Assistance: For Safety Considerations;For Strength Deficit;For Balance;Verbal Cues  Other Transfer Type: Sit to/from Stand  Other Transfer: Assistance Level: To/From;Toilet;Minimal Assist  Other Transfer: Assistive Device: Nurse, adult  Other Transfer: Type Of Assistance: For Safety Considerations;For Strength Deficit;For Balance;Verbal Cues  End Of Activity Status: Up in Chair;Nursing Notified;Instructed Patient to Request Assist with Mobility;Instructed Patient to Use Call Light  Comments: Patient requires assist from PT/spouse for brief management during restroom activity.     Gait  Gait Distance: 60 feet  Gait: Assistance Level: Minimal Assist;Safety Considerations  Gait: Assistive Device: Roller Walker  Gait: Descriptors: Variable step length;No balance loss;Forward trunk flexion  Comments: As patient fatigues, increased forward trunk lean and BLE shaking noted. Denies dizziness, but does endorse weakness and requires seated rest. This activity occurred after patient had prolonged time in the restroom this morning. Team in to see patient at this time.   Activity Limited By: Weakness    Education  Persons Educated: Patient/Family  Patient Barriers To Learning: None Noted  Teaching Methods: Verbal Instruction;Demonstration  Patient Response: Verbalized Understanding;Return Demonstration;More Instruction Required  Topics: Plan/Goals of PT Interventions;Use of Assistive Device/Orthosis;Mobility Progression;Safety Awareness;Up with Assist Only;Importance of Increasing Activity;Recommend Continued Therapy    Assessment/Progress  Impaired Mobility Due To: Decreased Strength;Impaired Balance;Decreased Activity Tolerance;Safety Concerns  Assessment/Progress: Should Improve w/ Continued PT    AM-PAC 6 Clicks Basic Mobility Inpatient  Turning from your back to your side while in a  flat bed without using bed rails: A Little  Moving from lying on your back to sitting on the side of a flatbed without using bedrails : A Little  Moving to and from a bed to a chair (including a wheelchair): A Little  Standing up from a chair using your arms (e.g. wheelchair, or bedside chair): A Little  To walk in hospital room: A Little  Climbing 3-5 steps with a railing: A Lot  Raw Score: 17  Standardized (T-scale) Score: 39.67  Basic Mobility CMS 0-100%: 43.83  CMS G Code Modifier for Basic Mobility: CK    Goals  Goal Formulation: With Patient/Family  Time For Goal Achievement: 5 days, To, 7 days  Patient Will Go Supine To/From Sit: w/ Stand By Assist  Patient Will Transfer Bed/Chair: w/ Stand By Assist  Patient Will Transfer Sit to Stand: w/ Stand By Assist  Patient Will Ambulate: 51-100 Feet, w/ Dan Humphreys, w/ Stand By Assist    Plan  Treatment Interventions: Mobility Training;Strengthening  Plan Frequency: 5 Days per Week  PT Plan for Next Visit: Progress proximal LE strengthening and consider 5x sit<>stand as standardized outcome for assessing. Progress gait distance with roller walker as able.     PT Discharge Recommendations  Recommendation: Inpatient setting;Recommend rehab medicine consult  Patient Currently Requires Physical Assist With: All mobility    Therapist: Edward Jolly, PT, DPT [phone]  Date: 02/04/2019

## 2019-02-04 NOTE — Progress Notes
Patient arrived to room # 2224 via wheelchair accompanied by transport. Patient transferred to the bed with assistance. Bedside safety checks completed. Initial patient assessment completed. Refer to flowsheet for details.    Admission skin assessment completed with: Unnah RN    Pressure injury present on arrival?: No    1. Head/Face/Neck: No  2. Trunk/Back: No  3. Upper Extremities: No  4. Lower Extremities: No  5. Pelvic/Coccyx: No  6. Assessed for device associated injury? Yes  7. Malnutrition Screening Tool (Nursing Nutrition Assessment) Completed? Yes    See Doc Flowsheet for additional wound details.     INTERVENTIONS:

## 2019-02-04 NOTE — Progress Notes
Just to clarify: The patient does not need a liver biopsy at this time.  Discussed with oncology and primary neurology team.       Future Appointments   Date Time Provider [place]   02/18/2019 10:40 AM Al-Rajabi, Yetta Flock, MD [facility]2 [facility] Ex[facility]       Kinnie Feil, MD   GI/Hepatology Fellow [phone]

## 2019-02-04 NOTE — Rehab Pre-Admission Screening
Physical Medicine & Rehabilitation Pre-Admission Screening       Mitchell Lucero is a 68 y.o.  male.     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 Patient presented???as a transfer to [facility] for transient episode of dysarthria associated with right arm large amplitude, irregular involuntary movement in both legs shaking yet retained awareness. Patient has been previously having some increasing number of falls at home. Patients hospital course has been complicated by anemia, thrombocytopenia, hyponatremia, as well as impaired mobility and activities of daily living.   ???  Patient has been working with physical and occupational therapy since 02-01-19 and has been making functional gains. Patient is anticipated to return to home at the modified independent level of care. Patient will also receive formal cognitive evaluation to assess for baseline status and any new cognitive/linguistic deficits.         Prior Level of Function          Self-Care/ADLs: Independent with ADLs and functional transfers  Mobility: Assist Needed with Mobility-Related ADL's/Ambulation  Work/Personal Responsibilities/Hobbies:  n/a  ???  Home Environment:  Home Situation: Lives with Family (Spouse and mother) (02/01/2019 10:00 AM)  Patient Owned Equipment: Bernardo Heater (02/01/2019 10:00 AM)  Type of Home: House (Split-level) (02/01/2019 10:00 AM)  Entry Stairs: Stair Glide (02/01/2019 10:00 AM)  In-Home Stairs: Stair Glide (02/01/2019 10:00 AM) Kriste Basque Equipment: Binnie Rail in Air traffic controller;Shower Chair (02/01/2019  9:00 AM)  ???  Support System:  Lives with spouse and mother         Current Level of Function     Physical Therapy: 02-04-19  Strength  Strength Position Assessed: Seated  Overall Strength: Able to Move All Joints Independently Through Available ROM;Generalized Weakness  ???  Bed Mobility/Transfer  Comments: Patient up in bedside chair at beginning and end of session. Bed mobility not assessed this date.   Transfer Type: Sit to Stand  Transfer: Assistance Level: From;Bed Side Chair;Minimal Assist  Transfer: Assistive Device: Nurse, adult  Transfers: Type Of Assistance: For Safety Considerations;For Strength Deficit;For Balance;Verbal Cues  Other Transfer Type: Sit to/from Stand  Other Transfer: Assistance Level: To/From;Toilet;Minimal Assist  Other Transfer: Assistive Device: Nurse, adult  Other Transfer: Type Of Assistance: For Safety Considerations;For Strength Deficit;For Balance;Verbal Cues  End Of Activity Status: Up in Chair;Nursing Notified;Instructed Patient to Request Assist with Mobility;Instructed Patient to Use Call Light  Comments: Patient requires assist from PT/spouse for brief management during restroom activity.   ???  Gait  Gait Distance: 60 feet  Gait: Assistance Level: Minimal Assist;Safety Considerations  Gait: Assistive Device: Roller Walker  Gait: Descriptors: Variable step length;No balance loss;Forward trunk flexion  Comments: As patient fatigues, increased forward trunk lean and BLE shaking noted. Denies dizziness, but does endorse weakness and requires seated rest. This activity occurred after patient had prolonged time in the restroom this morning. Team in to see patient at this time.   Activity Limited By: Weakness  Occupational Therapy: 02-03-19  ADL's  Comment: Pt reports completing ADLs prior to session this date.   ???  ADL Mobility  Transfer Type: Sit to/from stand  Transfer: Assistance Level: Minimal assist Transfer: Assistive Device: Roller walker  Transfer: Type of Assistance: For safety considerations;For balance;For strength deficit  End of Activity Status: Up in chair;Instructed patient to request assist with mobility(TABS alarm on)  Gait Distance: 200 feet  Gait: Assistance Level: Minimal assist;Maximum assist  Gait: Assistive Device: Roller walker  Gait Comments: Pt handed mask to don prior to exiting room. Pt takes hands off of RW to do this and reports he covered his eyes with mask which made him lose his balance--pt's knees are also noted to buckle and he begins to fall forward. Pt is able to safely regain upright stance with MaxA. Gait continued with chair follow for safety. Pt noted to have decreased step length and requires cues to keep RW closer to body.   ???  Cognition  Expression: Increased Time for Expression;Mumbling/Slurred(Per spouse, he is at his new baseline since prior stroke)  Problem Solving: Decreased Judgment/Safety  Orientation: Alert & Oriented x4  Attention: Awake/Alert    Speech Therapy:  Patient will have a formal cognitive evaluation by speech language pathology on inpatient rehab.          Rehabilitation Plan     Rehab Diagnosis and conditions that require Rehabilitation:      stroke   Cognitive impairment and Weakness       Active Comorbidities/Risk of Medical Complications:      1. 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 Patient admitted for  transient episode of dysarthria associated with right arm large amplitude, irregular involuntary movement in both legs shaking yet retained awareness.  2. Cognitive Impairment: Patient with cognitive impairment.  May need 24 hour supervision upon discharge home. Patient will have a formal cognitive evaluation while on inpatient rehab.   3. Anemia: Will need to monitor and manage, as this may negatively impact patient's endurance with therapy.  4. Diabetes: Due to history of DM2 there is a risk of hypo/hyperglycemia during mobilization as we titrate new and home insulin and diabetes medications. Patient is at risk for hyper/hypoglycemia during aggressive mobilization with therapies, and will need daily physician monitoring and adjustment of medications.  Patient may benefit from ongoing education from diabetes nurse educator for healthy lifestyle modifications.  5. Falls:  Patient will be placed close to the nurse's station for closer monitoring. Staff will round on the patient frequently to assess for any needs, such as using the restroom to help prevent falls as this increases the patient's risk for morbidity/mortality and 30 day readmission to the hospital rate.   6. Hyperlipidemia: Will continue to monitor and manage, and adjust medications as needed.  7. Hyponatremia: Patient is at risk for worsening electrolyte imbalance and will require physician monitoring of labs and adjustments to medications and fluid intake.  Will continue to monitor and manage, as this may affect patient's cognitive status.  8. Risk for Thrombosis: Patient has Heparin, sequential compression devices ordered.  Patient will be encouraged to ambulate frequently with the assistance of health care provider.  9. Thrombocytopenia:  Patient is at risk for bleeding and needs daily physician monitoring of labs and blood loss prevention.             Patient will receive Physical therapy, Occupational therapy and Speech therapy each 60 minutes a day, 5 days a week for a  total of 3 hours daily (minimum) for the duration of the rehabilitation stay within an interdisciplinary rehabilitation program with case manager/social worker, dietician, neuropsychologist, rehab nursing and PM&R oversight.  ???  Rehabilitation Prognosis: Fair to good  Medical Prognosis: The patient is deemed medically stable to tolerate, benefit from, and participate in IRF level services.  Tolerance for three hours of therapy a day: Good. Patient has participated well with therapies since 02-01-19 in the acute care setting and is anticipated to tolerate 3 hours of therapy per day, as required.   ???  ???  Goals/Barriers/Facilitators  Family / Patient Goals: return home to previous level of function  Mobility Goals: Overall goal is Modified independent and Physical Therapy will evaluate and treat ambulation/wheelchair use and bed transfers  Activities of Daily Living (ADLs) Goals: Overall goal is Modified independent and Occupational therapy will evaluate and treat basic Activities of Daily Living  Cognition / Communication Goals: Overall goal is Supervision and Speech therapy will evaluate and treat cognition and communication deficits and assess for safe swallow          Barriers & Interventions:   Caregiver Apprehension: Arrange caregiver support and discuss barriers and patient progress with caregivers when appropriate.  High Burden of Care: Initiate interdisciplinary rehabilitation to improve functional independence and reduce burden of care.  Medication Education:  Pharmacist and nursing staff to provide education to patient and family regarding medication side effects, special precautions, and safe administration.  Facilitators: good home setup, good family / social support, patient motivation, improving strength / endurance and improving medical condition  ???  Discharge Planning  Expected Length of Stay 14 day(s)  Expected Discharge Disposition Home  Expected Discharge Needs: Patient would benefit from ongoing physical, occupational and speech therapy at the outpatient level of care. Patient already owned a stair glide, grab bars in shower, shower chair and a walker prior to hospitalization; however physical and occupational therapy will determine most appropriate equipment needs upon discharge to home to improve upon safety and decrease risk of falls.          Romie Minus, BSN, RN

## 2019-02-04 NOTE — Progress Notes
Patient Name:Mitchell Lucero  Date of Birth: 17-Feb-1951    Insurance Company: [employer]  Policy#:  [id number]  Phone#: [phone]  Phone number for Pre-certification: [phone]    Spoke with: Gerald Stabs in the customer service department on 02/04/2019 .  Call reference#: [id number]    Effective date of policy#: 09/17/6281  Termination Date#: n/a    Calendar Year:  yes  If no, Benefit Period: n/a    General Benefits:  Deductible: n/a    Co-pay: $340/day for days 1-5  Co-Insurance: n/a  Annual Max Out of Pocket: $6200 Amount Met of Max Out of Pocket: $1080.63  Lifetime Max: n/a    Inpatient Rehab Benefits  Subject to the Deductible/co-insurance:  no  Co-pay: $340/day for days 1-5  Medical necessity vs Limit on number of days: medical necessity  Requires Pre-certification: yes    Outpatient Therapy Benefits  Subject to the deductible/co-insurance: no  Co-pay/visit: $40   Number of visits/plan year: medical necessity  Pre-certification Required: no    Home Health Therapy Benefits:  Subject to the deductible/co-insurance: no  Co-pay/visit: no  Number of visits/plan year:  Medical necessity  Pre-certification Required: no    Skilled Nursing Facility:  Subject to the deductible/co-insurance: no  Co-pay/day: Days 1-20    $0/day                     Days 21-100    $178/day  Number of days/plan year: 100  Number of Days Used: 0  Pre-certification Required: yes    DME:  20% co-ins

## 2019-02-04 NOTE — Progress Notes
Neurology Progress Note      Mitchell Lucero  Admission Date: 01/31/2019  LOS: 4 days     Assessment/Plan:  Principal Problem:    TIA (transient ischemic attack)    Mitchell Lucero is a 68 y.o. R handed male with PMH of HTN, DM2, C6-C7 ADCF in 1997, and recent left pontine stroke in 12/18/2018 who was transferred to [facility] for a transient episode of dysarthria for 1.5 hrs in the morning of 01/31/2019 associated with right arm large amplitude irregular involuntary movement and both leg shaking with retained awareness. He has been having increasing number of falls since he was discharged from rehab, primarily at night when its dark, even with walker. Unintentional weight loss of 30 lbs over the past year.  ???  Admission Neuro exam: AAO x3 not fully to time, Impaired repetition, No tongue atrophy/fasciculation/weakness, SA 4/4, EF and EE 4/5, FF/FE/FA/TA 3-4/4, with visible FDI atrophy bilaterally with fasciculation in the right FDI; diffusely hyperreflexic with jaw reflex, right hoffman, left grasp reflex, bilateral babinski signs, slow finger tapping bilaterally and clumsy heel shin on the left leg.    Recent pontine stroke with transient slurred speech  Transient episode of dysarthria and involuntary movement in the right arm, and both leg shaking.  Recurrent falls  -DDX:  Differential is broad at this point , his multiple exam findings could be explained by different Disease processes including his previous pontine stroke with involvement of  Middle cerebellar peduncle ( explains left sided Dysmetria and ataxia). Current Symptoms of Right arm Shaking with Dysarthria Likely represent TIA ( Limb shaking TIA ) given his extensive intracranial atherosclerosis . His symptoms usually happen when he stand up ( with decreased brain blood supply in the setting of orthostatsis). 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  - Degenerative Disorders is still in the differential given the orthostatic hypotension , gait issues and abnormal movements.  - MRI brain: with Signal abnormality without atrophy involving the left aspect of the  pons and left middle cerebellar peduncle corresponding with his recent pontine infarct.lack of significant atrophy somewhat goes against brainstem/cerebellar neurodegenerative diseases.  - MRI C- Spine: ???Prior ACDF at C6-C7 without high-grade central or neural foraminal   stenosis at the operated level.Multilevel central spinal stenosis, most pronounced of suspected at   least moderate degree at C4-C5.Multilevel degenerative neural foraminal narrowing.  - CTA brain Dense cavernous carotid calcification, ???Short segment occlusion or high-grade stenosis of the right P2 arterial segment, ???Dense atherosclerotic calcification within the V4 segment segments.  (moderate or high grade stenosis) , Mild stenosis of the left M1 origin.  - CTA neck : ???Mixed, predominantly soft plaque atherosclerosis of the right ICA   origin resulting in less than 50% stenosis by NASCET criteria. No significant left carotid stenosis by NASCET criteria.???Atherosclerotic calcification at the right vertebral artery origin resulting in at least mild stenosis.  - A1C 6.8 , Lipid Panel with LDL of 124 .   - 2-D echo with EF of 55% , Grade 1 LV diastolic dysfunction. Moderately Dilated Left atrium. Mild aortic stenosis. No evidence of intraatrial shunting.  - EEG 07/21: No epileptiform activity or lateralizing signs are seen  Plan  - Optimization of risk factors for secondary prevention of stroke:   > Continue DAPT  Aspirin  81 mg daily , Plavix 75 mg daily. > Continue High intensity statin Atorvastatin 80 mg QHS . Goal LDL < 70.   > Control orthostatic hypotension to prevent cerebral hypoperfusion in the setting if intracranial atherosclerosis.   > Will order Cardiac Event monitor on discharge to rule out Afib given left atrial dilation on Echo..  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  - Started Lactulose to target 3-4 BM per day.  - PT/OT following , recommends inpatient setting on discharge.      Orthostatic Hypotension , Improving.   - Patient reported Dizziness on standing.  - Orthostatic vital signs positive at the OSH . Orthostatic vital signs repeated on admission to [facility] and was again positive.  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 Other Possibility include Neurodegenerative disorders (like MSA).  - AM cortisol WNL.  - 07/21 : Received Fluid bolus . Rechecked orthostatic vital signs later still Significantly positive SBP 127 Supine to 76.  - 07/22 : Orthostatic vitals improving  With the stockings and Binder :supine  SBP 155 --> 138 standing   Plan  - Continue to hold PTA Lisinopril and Flomax.  - Decrease Midodrine to 1.25 mg TID to prevent supine hypertension.  - Compression stocking and abdominal binder.  - Cardiology follow up on discharge.    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  - On admission , patient reported around 30 pounds weight loss over the last year.  - CBC showed evidence of thrombocytopenia , Further workup with CMP showed total bili of 2 , elevated ALKP and GGT.  - Abdominal U/S showed ???Heterogeneous liver with posterior right hepatic mass,  - CT abdomen and Pelvis with contrast : Enhancing hepatic dome segment 7/8 lesion with washout, compatible with hepatocellular carcinoma. Cirrhosis with portal hypertension, including mild splenomegaly, portosystemic varices, and small volume ascites.Mild peritoneal thickening and enhancement, which may reflect infectious peritonitis. Metastatic disease could appear similar.  Prominent periportal, portacaval, and epiphrenic lymph nodes, which are indeterminate and may be reactive or metastatic.  Plan  - Hepatology consulted , Appreciate recs: resection is not a viable option given the evidence of portal hypertension.  will review his case with our multidiciplinary [facility] team on Monday of next week , maybe a candidate for local therapy . EGD was an outpatient .recommended further workup to look for the other possible underlying causes of Cirrhosis,  - Oncology Consulted , appreciate recs. recommend IR consult for liver biopsy to confirm tissue diagnosis  - IR consulted for liver Biopsy.  -  INR, AFP, AMA, ANA, ASMA, iron studies with ferritin, alpha-1 antitrypsin level obtained  - Hepatitis Panel ordered.  - AFP ordered.  - Started Lactulose to target 3 BMs per day.    DM2   - PTA Levemir 10 unites QHS and metformin 500 mg BID  > hold metformin, lantus 10 units QHS, LDCF    Pressure injuries   01/31/19 1844    Pressure Injury Present On Inpatient Admission: Y   Pressure Injury Orientation: Posterior   Wound Location: Ankle   Pressure Injury Stages: Stage 2     Pressure Injury 01/31/19 1844 Yes Posterior Ankle Stage 2 (Active)   01/31/19 1844    Pressure Injury Present On Inpatient Admission: Y   Pressure Injury Orientation: Posterior   Wound Location: Ankle   Pressure  Injury Stages: Stage 2         ???  FEN: no IVF  replete as needed, Cardiac diet and diabetic diet.  VTE ppx: heparin SQ  Dispo : Maintain admission to neurology service     Code : Full code.      Patient was seen and discussed with Dr. Consuella Lose.     , MBChB.  PGY-2  Pager:[phone]  ________________________________________________________________________    Subjective: Mitchell Lucero is a 68 y.o. male. Patient was feeling ok today . He is still trying to accept the new possible diagnosis and all the things going on. Wife reported that he did not have a bowel movement for the past three days .  Patient denies new episodesf Slurred speech or right arm shaking.      Objective:                    Vital Signs:  Last Filed                   Vital Signs: 24 Hour Range   BP: 111/61 (07/24 0554)  Temp: 36.7 ???C (98 ???F) (07/24 0554)  Pulse: 78 (07/24 0554)  Respirations: 17 PER MINUTE (07/24 0554)  SpO2: 98 % (07/24 0554)  BP: (111-179)/(61-91)   Temp:  [36.4 ???C (97.5 ???F)-36.7 ???C (98.1 ???F)]   Pulse:  [78-94]   Respirations:  [15 PER MINUTE-18 PER MINUTE]   SpO2:  [92 %-100 %]     Intensity Pain Scale (Self Report): (not recorded)    Intake/Output Summary: (Last 24 hours)    Intake/Output Summary (Last 24 hours) at 02/04/2019 0824  Last data filed at 02/03/2019 2035  Gross per 24 hour   Intake 490 ml   Output ???   Net 490 ml      Stool Occurrence: 0    Medications:  Scheduled Meds:aspirin chewable tablet 81 mg, 81 mg, Oral, QDAY  atorvastatin (LIPITOR) tablet 80 mg, 80 mg, Oral, QHS  citalopram (CeleXA) tablet 20 mg, 20 mg, Oral, QDAY  clopiDOGrel (PLAVIX) tablet 75 mg, 75 mg, Oral, QDAY  heparin (porcine) PF syringe 5,000 Units, 5,000 Units, Subcutaneous, Q8H  insulin aspart U-100 (NOVOLOG FLEXPEN) injection PEN 0-6 Units, 0-6 Units, Subcutaneous, ACHS (22)  insulin glargine (LANTUS SOLOSTAR) injection PEN 10 Units, 10 Units, Subcutaneous, QHS  midodrine (PROAMATINE) tablet 2.5 mg, 2.5 mg, Oral, TID (02-23-16)  pantoprazole DR (PROTONIX) tablet 20 mg, 20 mg, Oral, QDAY(21)  vitamin A & D topical ointment, , Topical, BID    Continuous Infusions:  PRN and Respiratory Meds:lactulose TID PRN       General physical exam:    HEENT: normocephalic, eyes open with no discharge, nares patent, oropharynx is clear with no lesions, palate intact  Carotids: No bruits CV: regular rate and rhythm, no murmur, distal pulses palpable  Chest: normal configuration, lungs are clear bilaterally  Ab: soft, non-tender, no masses, no organomegaly  Skin: petechial skin lesions over bilateral feet.      Neuro exam:   Mental status: alert, oriented to person/place/time       Speech:    Normal Abnormal   Fluency x    Comprehension x    Articulation x    Repetition x    Naming x        Cranial Nerves:    Normal Abnormal   II Pupils reactive, visual fields normal    III, IV, VI EOMI, no nystagmus    V Sensation nml  V1-V3    VII Orbicularis Oculi: 5    VIII nml to finger rub    IX, X +strong cough    XI Equal shoulder shrug    XII Tongue midline        Muscle/motor:   Tone: increased all over  Bulk: FDI atrophy bilaterally.  Fasciculations: FDI fasciculations bilaterally.  Pronator drift: none  Presence of asterixis on B/L UE.       NF NE SA EF EE WE WF FF FE FA TA HF HE KF KE DF PF   R   4+ 4+ 4 4 5 4 4 4 3  4+ 4+ 5 5 5 5    L   4+ 5 4+ 4+ 5 4 4 4 3  4+ 4 5 5 5 5        Sensation:                Light touch: diminished over bilateral lower extremeties up to the                Pin prick: Diminished up to the level of bilateral knees.               Vibration Diminshed over bilateral toes.               Proprioception: intact joint position sense over Bilateral lower extremieties    Coordination:    Normal Abnormal Right Abnormal Left   Finger to Nose   X slow with very mild dysmetria (improvig)   Rapid alternating   slow slow   Heel to [employer] on the left side   Finger tap  slow slow   Foot tap      Other        Reflexes:    Right Left   Triceps 2+ 2+   Biceps 2+ 2+   Brachioradialis 2+ 2+   Patella 2+ 2+   Ankle 1 1   Plantar up up       Other reflexes:  Jaw: Brisk   Hoffman: present on the right side.      Laboratory Review:     No results found for: PHART, PCO2A, PO2ART, HCO3A, BASEEXA, BASEDEFA, O2SATACAL  Lab Results   Component Value Date    HGB 11.5 (L) 02/04/2019 HCT 33.0 (L) 02/04/2019    PLTCT 97 (L) 02/04/2019    WBC 6.0 02/04/2019     Lab Results   Component Value Date    NA 140 02/04/2019    K 3.7 02/04/2019    CL 107 02/04/2019    CO2 26 02/04/2019    GAP 7 02/04/2019    BUN 18 02/04/2019    CR 0.69 02/04/2019    GLU 120 (H) 02/04/2019    CA 8.7 02/04/2019    TOTPROT 6.1 02/03/2019    AST 34 02/03/2019    ALT 34 02/03/2019    ALKPHOS 231 (H) 02/03/2019    AMMONIA 130 (H) 02/03/2019     No results found for: TNI, CKMB, MYOGLB  Lab Results   Component Value Date    INR 1.2 02/04/2019     No results found for: TSH, Oren Bracket  Lab Results   Component Value Date    CHOL 193 02/01/2019    TRIG 70 02/01/2019    HDL 60 02/01/2019    LDL 124 (H) 02/01/2019    VLDL 14 02/01/2019     No results found for: VANRAN, VANPK, VANTR  No results found  for: CYCLOSPOR, TACROLIMUS, FREEPHENY     Point of Care Testing:  (Last 24 hours):  Glucose: (!) 120  POC Glucose (Download): (!) 133    Radiology and Other Diagnostics Review: review    Jill Alexanders, MBChB   Neurology Resident

## 2019-02-04 NOTE — Care Plan
Problem: Discharge Planning  Goal: Participation in plan of care  Outcome: Goal Ongoing  Flowsheets (Taken 02/04/2019 0217)  Participation in Plan of Care: Involve patient/caregiver in care planning decision making  Goal: Knowledge regarding plan of care  Outcome: Goal Ongoing  Flowsheets (Taken 02/04/2019 0217)  Knowledge regarding plan of care:   Provide procedural and treatment education   Provide infection prevention education   Provide VTE signs and symptoms education   Provide plan of care education   Provide fall prevention education   Provide medication management education     Problem: Neurological Status, Impaired/Altered  Goal: Progress toward maximizing functional outcomes  Outcome: Goal Ongoing  Flowsheets (Taken 02/04/2019 0217)  Progress toward maximizing functional outcomes:   Manage environmental safety   Promote functional status   Observe patient activities     Problem: Infection, Risk of  Goal: Absence of infection  Outcome: Goal Ongoing  Flowsheets (Taken 02/04/2019 0217)  Absence of infection:   Monitor for signs and symptoms of infection   Implement prevention measures as indicated     Problem: Falls, High Risk of  Goal: Absence of falls-Adult Patient  Outcome: Goal Ongoing  Flowsheets (Taken 02/04/2019 0217)  Absence of falls-Adult Patient:   Provide safe ambulation.   Provde safe environment.   Provide fall prevention strategies.   Consider additional interventions if patient is confused, has gait/balance problems and on high risk medications.     Problem: Mobility/Activity Intolerance  Goal: Maximize functional ADL's and mobility outcomes  Outcome: Goal Ongoing  Flowsheets (Taken 02/04/2019 0217)  Maximize functional ADLs and mobility outcomes:   Maintain body position   Manage environmental safety     Problem: Neurological Status, Impaired/Altered  Goal: Progress toward maximizing functional outcomes  Outcome: Goal Ongoing  Flowsheets (Taken 02/04/2019 0217) Progress toward maximizing functional outcomes:   Manage environmental safety   Promote functional status   Observe patient activities

## 2019-02-04 NOTE — Progress Notes
Report given to IPR RN waiting for transport. WCTM

## 2019-02-04 NOTE — Progress Notes
Ordering Physician:Slavin  Type of Monitor:Looping  Length of Study:30 days   Dx:TIA

## 2019-02-05 LAB — POC GLUCOSE
Lab: 184 mg/dL — ABNORMAL HIGH (ref 70–100)
Lab: 107 mg/dL — ABNORMAL HIGH (ref 70–100)
Lab: 205 mg/dL — ABNORMAL HIGH (ref 70–100)
Lab: 252 mg/dL — ABNORMAL HIGH (ref 70–100)

## 2019-02-05 LAB — BASIC METABOLIC PANEL CELLULAR THERAPEUTICS: Lab: 138 MMOL/L — ABNORMAL LOW (ref 137–147)

## 2019-02-05 LAB — CBC CELLULAR THERAPEUTICS: Lab: 6.4 K/UL — ABNORMAL HIGH (ref >60)

## 2019-02-05 MED ORDER — SODIUM CHLORIDE 0.9 % FLUSH
3-5 mL | Freq: Three times a day (TID) | 0 refills | Status: DC
Start: 2019-02-05 — End: 2019-02-08

## 2019-02-05 NOTE — Progress Notes
PHYSICAL THERAPY     02/05/19 1340   Pain   Pain Scale No Pain   Cognitive   Orientation Oriented x4   Patient Behavior Calm;Cooperative   Family Behavior Supportive   Cognition Follows Commands   Home Environment   Ambulation Assist Independent Mobility in Community without Device  (pre-June)   Home Situation Lives with Family  (Spouse, patients' mother )   Patient Owned Arboriculturist, single point cane, shower chair, stair glide to basement   Home Layout Two Level   Entry Stairs Details No stairs through garage entry.   Entry Stairs # 0   In Home Stairs Details Once inside house from garage, uses stair glide to get to landing, gets off and walks a couple of feet and gets on an additional stair glide to get to main level.   Comment Spouse indicates that patient should've used a cane pre-June.    Prior Function   Self Care/ADL Assist Patient was independent with dressing, grooming, bathing. But it was getting much worse   Other Function Comments Spouse works. After patient's stroke, patient's spouse assisted patient to same level as his mother before she left for work. Patient's spouse is hoping to be able to continue to work. Patient/spouse indicate fall history - spouse reports 18+ falls   Range of Motion   ROM Position Assessed Seated   ROM Method Active   UE ROM Bilateral;Shoulder  (internal rotation mildly limited; otherwise WFL)   LE ROM Bilateral;WFL   ROM Comments Patient with previous cervical surgery and limited shoulder related to it.   Strength   Strength Position Assessed Seated   Gross Strength Grade 4+/5  (B LE)   Strength Comment Noted muscle wasting in R hand   Posture / Neurological   Head Control Independent   Posture Rounded Shoulders   Clonus L Ankle None   Clonus R Ankle None   Coordination Impaired Rapid Alternating Movement RUE;Impaired Rapid Alternating Movement LUE;Impaired Rapid Alternating Movement RLE;Impaired Rapid Alternating Movement LLE;Impaired Finger Nose Finger RUE;Impaired Finger Nose Finger LUE  (Slowed responses; states L side is easier to complete than R)   Posture/Neuro Comments Reports numbness in L leg at night; usually L knee to toes. Noted rash on buttocks, anterior-lateral thighs; nursing aware per spouse.   Subjective   Subjective Patient agreeable to participate in therapy. Patient's spouse present and provides much of answers due to slow processing noted from patient himself.   Bed Mobility   Bed Mobility: Rolling Right Supervision   Bed Mobility: Rolling Left Supervision   Bed Mobility: Supine to sit assist level Supervision   Bed Mobility: Supine to sit type of assist With head of bed flat;No rail   Bed Mobility: Sit to Supine Assist Level Supervision   Bed Mobility: Sit to supine type of assist With head of bed flat;No rail   Transfers   Transfer: Product/process development scientist: Sit to stand assist level Minimum assistance   Transfer: Sit to stand type of assist For safety   Transfer: Stand to sit assist level Minimum assistance   Transfer: Stand to sit type of assist For safety   Transfer: Stand pivot assist level Minimum assistance   Transfer: Stand pivot type of assist For safety   Transfer Comment At times, near moderate assist for safety due to impaired safety judgement. Needs cues to wait for therapist prior to standing; mildly impulsive.   Toileting   Toileting-Adjusting clothing BEFORE using toiet, commode,  bedpan or urinal Assist No   Toileting-Wipe Self Assist No   Toileting-Adjust clothing AFTER using toilet, commode, bedpan or urinal Assist No   Toileting Position Seated   Toileting Equipment Grab bar - left   Toileting Comments Steadying assist, increased time for pants management. Stands with wide base of support. Concern for balance loss due to posture/positioning/orthostasis. Minimal assist needed when reaching towards floor to pull up pants.   Toileting Systems analyst tranfer to Chief Financial Officer Grab bar - left  (Roller walker)   Daily Care   Patient continent of bladder? Yes, void   Urine Occurrence 1   Urine Output Source Void   Urine Color Unable to assess or not observed   Urine Description Unable to assess or not observed   Urinary Catheter / Perineal Care Self   Bowel Management Yes   Patient continent of bowel? Yes, No Bowel Program   Stool Occurrence 1   Stool Amount Medium   Stool Appearance Formed;Soft   Stool Color Tan   Last Bowel Movement Date 02/05/19   Gait   Gait Distance 50 feet   GAIT: Assist Level Minimum assistance  (plus spouse providing wheelchair follow for safety)   GAIT: Type of Assist For safety;Requires cues for placement of device   Gait: Assistive Device Roller [place];Wheelchair Follow   Gait: Patterns Ataxic;Decreased gait velocity;Trunk lean forward   Gait: Deviation Left Decreased stride length   Gait: Deviation Right Decreased stride length;Knee hyperextension/genu recurvatum   Activity Limited By Complaint of fatigue   Gait Comment Close wheelchair follow provided due to orthostasis from sitting to standing and due to fall risk.   Stairs   Stairs Comment Did not assess today 2* time. Patient has stair glide in home to go between levels.   Activity   PT Therapeutic Activities Vitals monitored sitting and standing - noted drop in blood pressure by 20 points, denies symptoms. Toileting activity.   Assessment   Assessment Patient presents with impaired functional mobility secondary to generalized weakness, coordination deficits, slowed cognitive processing and decreased safety judgement, high fall risk (18+ prior to admission) and orthostasis. Patient has a supportive wife, but will need to be at modified independent level for mobility using a walker at discharge while spouse is at work.   Plan Comments Outcomes assessments: 6 minute walk, 10 M walk, TUG, 5x sit to stand, Berg, ABC. Progressive mobility and gait with roller walker. Generalized strengthening/endurance. Gait with obstacle negotiations. Balance.   Recommendations   PT Discharge Recommendations Home with Intermittent Supervision;Home Health Setting;versus;Outpatient Therapy Setting   Equipment Recommendations Too early to be determined  (Owns roller walker, single point cane)   Expected Discharge Date 02/23/19   Weekly Goals   Weekly Bed Mobility Goals Patient will perform sit to supine with;Patient will perform supine to sit with   Patient will perform sit to supine with Modified independent   Patient will perform supine to sit with Modified independent   Weekly Transfer Goals Patient will complete sit to stand transfer with;Patient will complete stand to sit transfer with;Patient will complete stand pivot transfer with   Patient will complete sit to stand transfer with Stand by assistance   Patient will complete stand to sit transfer with Stand by assistance   Patient will complete stand pivot transfer with Stand by assistance   Weekly Ambulation/Stairs Goals Patient will ambulate   Patient will ambulate 150 feet;Minimum assistance  Goal(s) for the Stay   Patient will perform at ambulation level;household mobility;Modified independent     Henry Russel, PT, DPT, NCS   Board Certified Specialist in Neurologic Physical Therapy

## 2019-02-05 NOTE — Consults
CLINICAL NUTRITION                                                        Clinical Nutrition Initial Assessment    Name: Mitchell Lucero        MRN: [id number]          DOB: Dec 14, 1950          Age: 68 y.o.  Admission Date: 02/04/2019             LOS: 1 day    Recommendation:  ??? Continue current diet as ordered. Encourage patient to consume lean proteins, low CHO vegetables, and moderation of starchy foods  ??? Consider offering Boost Glucose Control during times of low appetite/patient wishing to skip a meal  ??? REC to check 25-OH vit D at some point to r/o deficiency; replace if <30ng/mL.   ? If vitamin D low, REC to supplement 50,000 units vitamin D weekly x 8-12 weeks or 5,000 units Vitamin D3 daily x 8 weeks.   ? Reason: No results found for: VITD25  ? Patient has not been taking supplementation PTA & levels could be low due to impaired liver function & hx of .    Comments:  67 y.o.???R handed???male???with PMH of HTN, DM2, C6-C7 ADCF in 1997, and recent left pontine stroke in 12/18/2018 who was transferred to [facility] for a transient episode of dysarthria for 1.5 hrs in the morning of 01/31/2019 associated with right arm large amplitude irregular involuntary movement and both leg shaking with retained awareness. He has been having increasing number of falls since he was discharged from rehab, primarily at night when its dark, even with walker. Unintentional weight loss of 30 lbs over the past year, therefore clinical nutrition was following during inpatient admission at [facility] for poor appetite. During this admission patient was also dx with [facility], likely secondary to NASH. Hepatology was following and onocolgy consulted. IR consulted for liver Biopsy. AFP WNL. Pt was taking metformin PTA, no longer taking per Wifes report today.    Dietitian consulted for DM nutrition education. This RD spoke w/ wife today via telephone, wife reported that PO intake has been improving since admission, doing much better, eating close to 75-100%of meals. She reported since stroke she has taken on his DM management, needs more guidance w/ meal planning and carbohydrate counting. Offered to provide education in person though wife also ok w/ over the phone education. See below for education provided. Handouts provided to RN. Wife had a few questions for this RD, all which were answered. Denied PTA vitmains/minerals. Contact info provided in d/c summary.    DM Education Summary:   ??? Reviewed [facility] Simple Carbohydrate Counting Handout  ??? Reviewed Healthy Meal Planning: MyPlate Model handout.   ??? Carbohydrate servings (1 serving = about 15 grams of carbohydrate)  ??? ~4 carbohydrate servings per Breakfast, Lunch, Dinner with 1-2 servings for HS snack  ? Reading a nutrition label  ??? Carbohydrate content of fruits, vegetables, grains, proteins, dairy.   ??? Consuming healthy fats and proteins with carbohydrate sources  ??? Benefits of adding fiber into diet  ??? Frozen meals - Healthy Choice - Low sodium  ??? Normal blood sugar ranges   ??? Written materials were provided    Nutrition Assessment of Patient:  Admit Weight:  70.3 kg([facility] admission weight);  ; Desired Weight: 63.7 kg  BMI (Calculated): 27.86; BMI Categories Adult: Over Weight: 25-29.9; Appearance: Unable to observe(thin per previous RD assessment)  Pertinent Allergies/Intolerances: none  Pertinent Labs: Na 138, K+3.5, otherwise WNL. 24hour FSBS of 107-205mg /dl, A5W of 0.9%; Pertinent Meds: Zofran, PPI, lactulose, insulin, bowel meds. ;    Oral Diet Order: Diabetic 2100-2400 Kcal/day (75 g carb/meal, 30 g carb/HS snack);       Current Oral Intake: Improving;Marginally Adequate           Estimated Calorie Needs: 1590-1911(25-28 kcal/kg DBW)  Estimated Protein Needs: 76-82(1.2-1.3 g/kg DBW)    Malnutrition Assessment:   Does not meet criteria        Nutrition Focused Physical Assessment:   Edema: No       Pressure Injury: none     Comment: Soft, +BM 7/25 Nutrition Diagnosis:  Inadequate oral intake  Etiology: recent poor appetite; slowly improving  Signs & Symptoms: pt and wife reports                       Intervention / Plan:  Provided Dm education, encouraged PO intake, discussed recommendaitons  Montior weight, labs, meds, Gi symptoms, PO intake       Goals:  Patient to consume >75% of meals/supplements  Time Frame: Within 5 days  Aid in stabilizing blood glucose  Time Frame: Throughout stay  Verbalize understanding of diet  Time Frame: Prior to discharge     Konrad Saha, MS, RD, LD, CNSC    [place]: [phone] (Preferred Communication Method)  Pager: [phone]*

## 2019-02-05 NOTE — Discharge Instructions
Please don't hesitate to contact a  registered dietitian if needed:   Contact information:  Brigid Re, MS, RD, LD, [place]  Phone: [phone]  [email or web address]    [facility] Classes and Education Opportunities  Diabetes Survival Skills Basic survival skills class with information tailored to meet your specific needs  free class;  sign up REQUIRED; every Monday and Wednesday (except holidays)     Due to COVID 19 scheduling changes, would recommend calling the number below to determine class availability.   To schedule Diabetes Education please call [phone], Option 3

## 2019-02-05 NOTE — Progress Notes
???  [facility]  ???  PHYSICAL MEDICINE & REHABILITATION  ???  Progress  ???   ???  Date of Service:  02/05/2019  Mitchell Lucero is a 68 y.o. male.     DOB: 09-24-1950                  MRN#:  [id number]  Primary Insurance: [employer] P[employer] MEDICARE  Secondary Insurance:   Tertiary Insurance:   Financial Class:  Medicare Repl  Date of Admission:  02/04/2019  Precautions: Fall  Weight bearing Precautions:  WBAT    Active Problems  Principal Problem:    Left pontine stroke (HCC)  Active Problems:    TIA (transient ischemic attack)    HTN (hypertension)    Tremors of nervous system    T2DM (type 2 diabetes mellitus) (HCC)    History of neck surgery    Gait abnormality      ???  ASSESSMENT & PLAN:  ???   ???    Mitchell Lucero is a 68 y.o. male admitted to [facility] on 02/04/2019 with the following issues:    Left pontine stroke (CVA), late effects below  > dysarthria, RUE weakness/involuntary movement  > RLE weakness/involuntary movements  > impaired gait, mobility and ADLs  > impaired balance with increased risk of falls  > impaired endurance, impaired cognition    Impairments: cognitive impairments, dysarthria, loss of coordination, poor activity tolerance and weakness  Activity Limitations:  grooming, bathing, dressing - lower, toileting, transfers, ambulation and stairs  Participation Restrictions: unable to return home safely     REHABILITATION PLAN  Patient will be admitted to inpatient rehabilitation for comprehensive therapies to include Physical therapy, Occupational therapy and Speech therapy  Rehabilitation Prognosis: Good    Tolerance for three hours of therapy a day: Good    Goals/Barriers/Facilitators  Family / Patient Goals: return home with family supervision  Mobility Goals: Overall goal is Supervision  Activities of Daily Living (ADLs) Goals: Overall goal is Supervision  Cognition / Communication Goals: Speech therapy will evaluate and treat cognition and communication deficits and assess for safe swallow    Barriers & Interventions: Caregiver Apprehension: Arrange caregiver support and discuss barriers and patient progress with caregivers when appropriate.  Financial Resources: Determine resources availability and Explore financial assistance options.  High Burden of Care: Initiate interdisciplinary rehabilitation to improve functional independence and reduce burden of care.  Facilitators: good home setup, controlled pain, treated depression / anxiety, good family / social support and patient motivation      MEDICAL PROBLEMS AND CO-MORBIDITIES:       NEURO  Left pontine stroke (CVA), 12/18/2018  Encephalopathy s/p L pontine CVA   > seen at [facility] [place]   > treated and sent to rehab, dc from rehab and had multiple falls   > worsening dysarthria, RUE weakness/involuntary movement noted on 01/31/2019    > transferred to [facility]    > Routine EEG shows mild encephalopathy, no epileptiform activity    > neurology ddx includes TIA vs neurodegenerative condition  - MRI brain: with Signal abnormality without atrophy involving the left aspect of the  pons and left middle cerebellar peduncle corresponding with his recent pontine infarct.lack of significant atrophy somewhat goes against brainstem/cerebellar neurodegenerative diseases.  - MRI C- Spine: ???Prior ACDF at C6-C7 without high-grade central or neural foraminal   stenosis at the operated level.Multilevel central spinal stenosis, most pronounced of suspected at  least moderate degree at C4-C5.Multilevel degenerative neural foraminal narrowing.  - CTA brain Dense cavernous carotid calcification, ???Short segment occlusion or high-grade stenosis of the right P2 arterial segment, ???Dense atherosclerotic calcification within the V4 segment segments.  (moderate or high grade stenosis) , Mild stenosis of the left M1 origin.  - CTA neck : ???Mixed, predominantly soft plaque atherosclerosis of the right ICA origin resulting in less than 50% stenosis by NASCET criteria. No significant left carotid stenosis by NASCET criteria.???Atherosclerotic calcification at the right vertebral artery origin resulting in at least mild stenosis.    > per neuro: significant ischemic white matter disease although underlying MSA or other Parkinsonism cannot be ruled out at this time.    > f/u OP with Mov Disorders neurology/requested     > continue ASA, Plavix, Statin  Deconditioning and weakness s/p L pontine CVA    > BLE weakness/involuntary movements   > impaired gait, mobility and ADLs   > impaired balance with increased risk of falls   > impaired endurance    > consult OT, PT    CARDIO:  HLD, HTN, but current hypotension episodes   > some SBP 160s, otherwise light headed and dizziness orthostatic   > TTE normal EF, moderate LA dilation, no thrombus/shunt.   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 Skin rash posterior thighs.   Bowel: continent of bowel  Bladder: continent of bladder  Nutrition: Current diet:  diabetic Mental Health: consult neuropsychology to provide support / counseling  DVT Prophylaxis: Heparin   Code: Full code  DC Plans: TBD    Sat No changes      Subjective     Pt seen at bedside during SLP. Reports  An episode of low blood pressure.  Despite current interventions. Notes decreased oral fluid intake.  But has been working to improve. No other symptoms noted.       Medications:  Scheduled Meds:aspirin chewable tablet 81 mg, 81 mg, Oral, QDAY  atorvastatin (LIPITOR) tablet 80 mg, 80 mg, Oral, QHS  citalopram (CeleXA) tablet 20 mg, 20 mg, Oral, QDAY  clopiDOGrel (PLAVIX) tablet 75 mg, 75 mg, Oral, QDAY  docusate (COLACE) capsule 100 mg, 100 mg, Oral, BID  heparin (porcine) PF syringe 5,000 Units, 5,000 Units, Subcutaneous, Q8H  insulin aspart U-100 (NOVOLOG FLEXPEN) injection PEN 0-6 Units, 0-6 Units, Subcutaneous, ACHS (22)  insulin glargine (LANTUS SOLOSTAR) injection PEN 10 Units, 10 Units,  Subcutaneous, QHS  lactulose oral solution 20 g, 30 mL, Oral, TID  melatonin tablet 3 mg, 3 mg, Oral, QHS  midodrine (PROAMATINE) tablet 1.25 mg, 1.25 mg, Oral, TID (02-23-16)  pantoprazole DR (PROTONIX) tablet 20 mg, 20 mg, Oral, QDAY(21)  senna (SENOKOT) tablet 2 tablet, 2 tablet, Oral, QHS  sodium chloride PF 0.9% flush 3-5 mL, 3-5 mL, Flush, FLUSH TID  vitamin A & D topical ointment, , Topical, BID    Continuous Infusions:  PRN and Respiratory Meds:acetaminophen Q4H PRN, aluminum/magnesium hydroxide Q4H PRN, hydrocortisone TID PRN, milk of magnesia (CONC) Q4H PRN, ondansetron Q6H PRN, traZODone QHS PRN        ???  PHYSICAL EXAM:  ???   ???    BP: 126/75 (07/25 0800)  Temp: 36.6 ???C (97.8 ???F) (07/25 0800)  Pulse: 77 (07/25 0800)  Respirations: 18 PER MINUTE (07/25 0800)  SpO2: 98 % (07/25 0800)  Height: 160.2 cm (63.07) (07/24 1500)    Body mass index is 27.86 kg/m???.    Gen: Alert & Oriented X 3, No Acute Distress  HEENT: NCAT, PERRL, +glasses, + mask  Neck: Supple, adequate ROM  Heart: well perfused  Lungs: no distress Abdomen: nondistended  GU:  No foley.  Skin: hair and nails present  Ext: No pedal edema  ZOX:WRUE, generalized degenerative ghanges  Neuro: no tremor  ???  OBJECTIVE DATA:  ???   ???     Intake/Output Summary:    Intake/Output Summary (Last 24 hours) at 02/05/2019 0924  Last data filed at 02/05/2019 0600  Gross per 24 hour   Intake 550 ml   Output 650 ml   Net -100 ml       Stool Occurrence: 1 (02/04/2019  4:09 PM)    Last Bowel Movement Date: 02/04/19 (02/04/2019  9:50 PM)    No data recorded  No data recorded  No data recorded  No data recorded  Oral Diet Order: Diabetic 1600-2000 Kcal/day (60 g carb/meal, 30 g carb/HS snack) (02/02/2019  2:00 PM)      Basic Metabolic Profile      Radiology:    Reviewed. No new images    Chart reviewed, nursing notes and therapy notes reviewed.   Burney Gauze, DO  /02/05/2019  9:25 AM

## 2019-02-05 NOTE — Progress Notes
SPEECH-LANGUAGE PATHOLOGY  COGNITIVE ASSESSMENT     EVALUATION SUMMARY  Summary: Pt seen for cognitive-communication evaluation. At this time, Mitchell Lucero presents with suspected moderate cognitive-communication deficits in the area of attention, memory, and complex reasoning. Initiated cognitive testing utilizing the Repeatable Battery for the Assessment of Neuropsychological Status, Form A (RBANS.) The RBANS was not completed given time constraints. Will complete evaluation 02/07/19 and report results at that time. Given pt report and performance on today's testing, anticipate pt would benefit from continued SLP services to address cognitive-communication deficits. Please see below for additional details.  Prognosis: Good  Plan: Continue Treatment Daily.  Results Reported to Physician: Yes(EMR)    EVALUATION TO BE COMPLETED - RBANS ??? Repeatable Battery for the Assessment of Neuropsychological Status, Form A  Domain Subtest Total Score Index Score Classification Notes   Immediate Memory List Learning 18/40 61 Extremely low       Story Memory 9/24         Visuospatial/  constructional Figure Copy 15/20 81 Low average       Line Orientation 16/20         Language Picture Naming 10/10 82 Low average          Semantic Fluency 11/40         Attention Digit Span 8/16 TBD TBD       Coding TBD/89         Delayed Memory List Recall 2/10 92 Average       List Recognition 20/20           Story Recall 5/12           Figure Recall 10/20         Sum of Index Scores       TBD     TOTAL SCALE      Results will be reported when completed 02/07/19          PRAGMATICS   Comments*: Pragmatics mildly impaired. Pt demonstrated mildly reduced initiation. Turn-taking and topic maintenance WFL. Normal eye contact observed.     BEHAVIOR  Comments*: Behavior WFL's. Pt was cooperative throughout assessment with no impulsivity or lability observed.     AUDITORY COMPREHENSION   Comments*: No focal language deficits observed.     ORIENTATION Comments*: A&O x4    Objective*  Relevant Med Background: This is a 68 year old right-handed male with a past medical history of hypertension, diabetes type 2, C6-C7 ACDF in 1997 and orthostasis who presented to [facility] hospital for a transient episode of dysarthria for about 1 hour and half in the morning of January 31, 2019.  Lives With: Spouse  Receives Help From: Family  Psychosocial Status: Willing and Cooperative to Participate  Persons Present: Spouse    Subjective*  Pain: Patient has no complaint of pain    Education*  Persons Educated: Pt/Family  Barriers To Learning: Cognitive Deficits  Interventions: Family Educated  Teaching Methods: Verbal  Topics: Memory  Patient Response: Verbalized Understanding  Goal Formulation: With Pt/Family    Therapist: Gae Bon, MS, L/CCC-SLP        Office: [phone]  Date: 02/05/2019

## 2019-02-05 NOTE — Progress Notes
Oncology Brief  Note    Name:  Mitchell Lucero   [id number] Date:  02/04/2019  Admission Date: 02/04/2019  LOS: 0 days                     Principal Problem:    Left pontine stroke [facility])  Active Problems:    TIA (transient ischemic attack)    HTN (hypertension)    Tremors of nervous system    T2DM (type 2 diabetes mellitus) (HCC)    History of neck surgery         Assessment and Plan:    Mitchell Lucero is a 68 y.o. male.  1. 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 Mild peritoneal thickening and enhancement, may be infectious peritonitis or metastatic disease. Prominent periportal, portacaval and epiphrenic LNs.   ???  2. Dysarthria/RUE shaking  - 02/01/19 MRI brain- subacute appearing infarct in L lateral pons/cerebellar peduncle. Bilateral diffuse white matter disease in periventricular/subcortical areas as well as in brainstem. 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 Bilateral carotid athero and at vertebral origin.  - Echo- normal EF, moderate LA dilation, no thrombus/shunt.   - Routine EEG shows mild encephalopathy, no epileptiform activity  - Neuro following- attributes speech/arm movements to limb shaking TIAs (vs seizures, neurodegenerative condition)  ???  Plan  - AFP - 2.3  - Hepatology following. Discussed with hepatology- they would not recommend liver biopsy at this moment as biopsy could be done during liver directed therapy- either TACE or MWA if needed  - Plan to discuss the case during tumor boards on [place] as per hepatology - Medical Oncology follow up requested with Dr. Tamsen Meek- f/u appt on 02/18/19  - Plan communicated with the primary team        Patient seen and discussed with Dr. Bernerd Limbo, MBBS  [id number]- Hematology/Medical Oncology Fellow  [facility] [place]   Pager- [phone]   Date:  02/04/2019                  Subjective  Mitchell Lucero is a 68 y.o. male.  Patient transferred to rehab today.       Medications  Scheduled Meds:[START ON 02/05/2019] aspirin chewable tablet 81 mg, 81 mg, Oral, QDAY  atorvastatin (LIPITOR) tablet 80 mg, 80 mg, Oral, QHS  [START ON 02/05/2019] citalopram (CeleXA) tablet 20 mg, 20 mg, Oral, QDAY  [START ON 02/05/2019] clopiDOGrel (PLAVIX) tablet 75 mg, 75 mg, Oral, QDAY  docusate (COLACE) capsule 100 mg, 100 mg, Oral, BID  heparin (porcine) PF syringe 5,000 Units, 5,000 Units, Subcutaneous, Q8H  insulin aspart U-100 (NOVOLOG FLEXPEN) injection PEN 0-6 Units, 0-6 Units, Subcutaneous, ACHS (22)  insulin glargine (LANTUS SOLOSTAR) injection PEN 10 Units, 10 Units, Subcutaneous, QHS  lactulose oral solution 20 g, 30 mL, Oral, TID  melatonin tablet 3 mg, 3 mg, Oral, QHS  midodrine (PROAMATINE) tablet 1.25 mg, 1.25 mg, Oral, TID (02-23-16)  pantoprazole DR (PROTONIX) tablet 20 mg, 20 mg, Oral, QDAY(21)  senna (SENOKOT) tablet  2 tablet, 2 tablet, Oral, QHS  vitamin A & D topical ointment, , Topical, BID    Continuous Infusions:  PRN and Respiratory Meds:acetaminophen Q4H PRN, aluminum/magnesium hydroxide Q4H PRN, hydrocortisone TID PRN, milk of magnesia (CONC) Q4H PRN, ondansetron Q6H PRN, traZODone QHS PRN      Objective                       Vital Signs: Last Filed                 Vital Signs: 24 Hour Range   BP: 162/81 (07/24 1500)  Temp: 36.6 ???C (97.8 ???F) (07/24 1500)  Pulse: 77 (07/24 1500)  Respirations: 18 PER MINUTE (07/24 1500)  SpO2: 97 % (07/24 1500)  Height: 160.2 cm (63.07) (07/24 1500) BP: (111-162)/(61-84)   Temp:  [36.4 ???C (97.5 ???F)-36.7 ???C (98 ???F)]   Pulse:  [76-79] Respirations:  [15 PER MINUTE-18 PER MINUTE]   SpO2:  [97 %-100 %]      Vitals:    02/04/19 1500   Weight: 71.5 kg (157 lb 10.1 oz)             Lab Review  CBC w/Diff    Lab Results   Component Value Date/Time    WBC 6.0 02/04/2019 06:05 AM    RBC 3.70 (L) 02/04/2019 06:05 AM    HGB 11.5 (L) 02/04/2019 06:05 AM    HCT 33.0 (L) 02/04/2019 06:05 AM    MCV 89.2 02/04/2019 06:05 AM    MCH 31.1 02/04/2019 06:05 AM    MCHC 34.9 02/04/2019 06:05 AM    RDW 15.6 (H) 02/04/2019 06:05 AM    PLTCT 97 (L) 02/04/2019 06:05 AM    MPV 8.0 02/04/2019 06:05 AM    No results found for: NEUT, ANC, LYMA, ALC, MONA, AMC, EOSA, AEC, BASA, ABC     Comprehensive Metabolic Profile    Lab Results   Component Value Date/Time    NA 140 02/04/2019 06:05 AM    K 3.7 02/04/2019 06:05 AM    CL 107 02/04/2019 06:05 AM    CO2 26 02/04/2019 06:05 AM    GAP 7 02/04/2019 06:05 AM    BUN 18 02/04/2019 06:05 AM    CR 0.69 02/04/2019 06:05 AM    GLU 120 (H) 02/04/2019 06:05 AM    Lab Results   Component Value Date/Time    CA 8.7 02/04/2019 06:05 AM    ALBUMIN 2.6 (L) 02/03/2019 06:53 AM    TOTPROT 6.1 02/03/2019 06:53 AM    ALKPHOS 231 (H) 02/03/2019 06:53 AM    AST 34 02/03/2019 06:53 AM    ALT 34 02/03/2019 06:53 AM    TOTBILI 2.0 (H) 02/03/2019 06:53 AM    GFR >60 02/04/2019 06:05 AM    GFRAA >60 02/04/2019 06:05 AM          Other pertinent lab and radiology reviewed and/or noted in Assessment and Plan.        Ty Hilts, MBBS  [phone]- Hematology/Medical Oncology Fellow  [facility] [place]   [phone]

## 2019-02-05 NOTE — Progress Notes
2336: pt. called to use the restroom. While transferring pt. using stand pivot technique, pt. started having uncontrolled movements to RUE and BLE with dizziness. Denies chest pain. Pt. was laid back down in bed. Symptoms subsided instantly and asked to rest for a minute. Alert, oriented with delayed response, calm and coperative. Spouse at the bedside and reported "this is the 4th time it happens". Vital signs within normal.   2345: reported feeling better and wanted to void, transferred to bedside commode x 2 assist. Pt. had mild involuntary movements to RUE and delayed response with speech again with dizziness. Asked to lay back down. Transferred back to bed and that is when he felt better. Bp within normal. Pt. On midodrine 3 times a day. Pt. Urine dark yellow encouraged to increase water intake.   0000: pt. resting comfortably. Spouse at the bedside. Will continue to monitor.  0045: Dr. Percell Miller notified.

## 2019-02-05 NOTE — Progress Notes
OCCUPATIONAL THERAPY       02/05/19 1000   Home Environment   Ambulation Assist Independent Mobility in Community without Device   Home Situation Lives with Family  (spouse)   Type of Home House  (split level)   Patient Owned Equipment roller walker, cane   Home Layout Two Level;Bed/Bath Upstairs  (stair glider)   Entry Stairs # 0   In Home Stairs Details one rail   In Home Stairs # 13   Financial risk analyst / Tub Walk-in Firefighter in Air traffic controller;Shower Chair   Comment Bathroom is not accessible by walker   Range of Motion   ROM Position Assessed Seated   ROM Method Active   UE ROM Bilateral;WFL   Strength   Strength Position Assessed Seated   OT Strength Comment BUE grip strength decreased, history of cervical surgery resulting in decreased right hand sensation and strength   Prior Function   Self Care/ADL Tasks Bathing;Dressing;Feeding;Grooming;Shower transfer   [employer] spouse assists   Vocational On Disability   Leisure/Hobbies watching tv   Other Function Comments Spouse works full time. Pt was indepedent prior to stroke in June. Significant fall history since stroke. Reports was doing really well initially after stroke and has gradually required more assistance since   Subjective   Subjective Pt in chair upon arrival of OT and agreeable to OT session   Eating   Eating Assist Stand By Assist   Eating Position Sitting in chair   Grooming   Grooming - Wash Face Assist No   Grooming - Brush Teeth Assist No   Grooming - Wash Both Hands Assist No   Grooming - Comb/Brush Hair Assist No   Grooming Assist Stand By Assist   Grooming Position Sitting in chair   Grooming Comments setup assist, increased time to perform and cues for initiation. BUE intention tremors-noted were baseline   Bathing   Bath/Shower Soap and water shower/bath   Bathing - Chest Assist No   Bathing - Left Arm Assist No   Bathing - Right Arm Assist No   Bathing - Abdomen Assist No Bathing - Perineal Area Assist No   Bathing - Buttocks Assist Yes   Bathing - Left Upper Leg Assist No   Bathing - Right Upper Leg Assist No   Bathing - Left Lower Leg Including Foot Assist Yes   Bathing - Right Lower Leg Including Foot Assist Yes   Bathing Assist Total Assist   Bathing Position Shower - standing for ___ % tasks  (10)   Bathing Equipment Grab bars - right;Grab bars - front;Hand held shower;Shower chair with back;Washcloth   Bathing Comments rated total assistance for safety and cues. Pt required cues for sequencing steps and thoroughness. Pt with slow processing and requiring cues for initiation    Upper Body Dressing   UE Dressing Assist Moderate Assist   Upper Dressing Position Sitting in chair   Upper Dressing Comments assist to pull over head and down in back   Lower Body Dressing   LE Dressing Assist Total Assist   Lower Dressing Position Sitting in chair   Lower Dressing Comments rated total assist for safety and cues for sequencing. brief, pants, socks and compression stockings. able to thread RLE into brief and pants with assist to thread LLE, assist to pull pants over hips. assist to don bilateral socks   Toileting   Toileting-Adjusting clothing BEFORE using toiet, commode, bedpan or urinal  Assist No   Toileting-Wipe Self Assist Yes   Toileting-Adjust clothing AFTER using toilet, commode, bedpan or urinal Assist Yes   Toileting Assist Maximum Assist   Toileting Position Seated   Toileting Equipment Grab bar - left   Toileting Transfer   Toilet Transfer Technique Posterior transfer onto receptacle   Toilet Transfer Assist Moderate assistance   Toilet Transfer Equipment Grab bar - left   Toilet Transfer Comments Pt ambulated to/from bathroom with RW and minimal assist.    Tub/Shower Transfer   Tub/Shower Transfer Technique Posterior transfer onto receptacle   Shower Transfer Assist Moderate assistance   Tub/Shower Transfer Equipment Grab bar - right   Bed Mobility Bed Mobility Comments Pt up in recliner upon arrival of OT.    Transfers   Transfer: Product/process development scientist: Sit to stand assist level Minimum assistance   Transfer: Sit to stand type of assist For safety   Transfer: Stand to sit assist level Minimum assistance   Transfer: Stand to sit type of assist For safety   Transfer: Stand pivot assist level Minimum assistance   Transfer: Stand pivot type of assist For safety   Transfer Comment Pt ambulated to/from bathroom with RW and minimal-moderate assist.   Vision   Current Vision Wears Glasses All of the Time   Comment History of cataract surgery, reports no acute visual changes. Noted mild left side visual inattention. Will continue to assess.    Communication/Cognition   Comprehension   (increased time)   Expression Increased Time for Expression;Mumbling/Slurred   Social Interaction   (Flat, increased time)   Problem Solving Cueing to Sequence Task;Decreased Judgment/Safety   Memory WFL Adequate to Recall Day to Day Activities   Attention Awake/Alert   Cognition Comment Pt requires increased procressing time for comprehension and verbal expression.     Activity   Therapeutic Activities ADLS performed as descirbed above.   Assessment   Assessment Patient presents with impaired ADL, IADL and functional transfers secondary to balance, strength, and cognitive deficits. Pt will benefit from intensive OT services to increase independence for safe discharge home.   Plan   OT Plan Cognitive training;Fine motor coordination;Functional transfers training;Self-care retraining;Therapeutic exercises;Vision evaluation   Intensity 90   Plan Comments outcome measures (dynamometer, ARAT, BITS assessments), monitor blood pressure (stockings, abdominal binder), LE dressing, toileting, balance during ADLS functional cognition   Recommendations   OT Discharge Recommendations Home with Home Health;Vs;Home setting w/Outpatient Therapy OT Equipment Recommendations Patient owns necessary equipment   Expected Discharge Date 02/23/19   Education   Persons Educated Patient/Family   Teaching Methods Verbal Instruction   Patient Response Verbalized Understanding   Topics Role of OT, Goals for Therapy   Goal Formulation With Patient/Family   Weekly Goals   ADL Goals Bathing;Dressing UE;Dressing LE;Grooming;Toileting   Patient Will Perform Bathing In Chair;w/ Moderate Assist   Patient Will Perform UE Dressing In Chair;w/ Stand By Assist   Patient Will Perform LE Dressing In Chair;w/ Minimum Assist   Patient Will Perform Grooming Standing at Sink;w/ Minimum Assist   Patient Will Perform Toileting w/ Minimum Assist   Function Transfer Goals Shower;Toilet   Pt Will Nurse, children's w/ Minimum Assist   Pt Will Transfer To Toilet w/ Minimum Assist   Goals for the stay   Pt will perform basic care and transfer with Modified independence  (least restrictive device, assistance with IADLs)     Nicola Girt, OTR/L

## 2019-02-06 LAB — POC GLUCOSE
Lab: 149 mg/dL — ABNORMAL HIGH (ref 70–100)
Lab: 155 mg/dL — ABNORMAL HIGH (ref 70–100)
Lab: 172 mg/dL — ABNORMAL HIGH (ref 70–100)
Lab: 198 mg/dL — ABNORMAL HIGH (ref 70–100)

## 2019-02-06 LAB — CBC CELLULAR THERAPEUTICS: Lab: 5.5 10*3/uL — ABNORMAL LOW (ref 4.5–11.0)

## 2019-02-06 LAB — BASIC METABOLIC PANEL CELLULAR THERAPEUTICS: Lab: 139 MMOL/L — ABNORMAL LOW (ref 137–147)

## 2019-02-06 MED ORDER — HYDROCORTISONE 1 % TP CREA
Freq: Three times a day (TID) | TOPICAL | 0 refills | Status: DC
Start: 2019-02-06 — End: 2019-02-10

## 2019-02-06 NOTE — Consults
ACTIVITY THERAPY   Initial Assessment of Interests    Name: Mitchell Lucero ;   DOB: 08/22/1950; 68 y.o.; male   Problems List: Present on Admission:   HTN (hypertension)   Tremors of nervous system   T2DM (type 2 diabetes mellitus) ([place])   History of neck surgery   Left pontine stroke ([place])   TIA (transient ischemic attack)   Gait abnormality      COVID-19 has shifted activity therapy programming. Prior to meeting with this patient, their H&P and other progress notes were reviewed for general understanding of their medical, physical, psychological, and cognitive disposition. Pt received education on the role of activity therapy, to encourage engagement in social and leisure interests. They also answered questions regarding their interests.       Subjective     Pt was agreeable to participating in this assessment. Pt presented with congruent affect throughout the session.                      Social  Pt reports enjoying time with family, especially great-grandson.    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    Plan  Therapist plans to provide 2-3 AT sessions each week of pt's stay.       Pauline Aus, Activity Therapist, MA MT-BC  Board Certified - Music Therapist  [employer] @ Pauline Aus

## 2019-02-06 NOTE — Progress Notes
Pt. has a rash to BLE that is getting worst compare to yesterday. Noted to be worst at the back of his upper legs. Pt. denies pain/itchinng. PRN Hydrocortisone cream administered. Spouse at the bedside. Dr. Percell Miller notified, stated he will assess the rash in AM. Will continue to minitor.

## 2019-02-06 NOTE — Progress Notes
???  [facility]  ???  PHYSICAL MEDICINE & REHABILITATION  ???  Progress  ???   ???  Date of Service:  02/06/2019  Mitchell Lucero is a 68 y.o. male.     DOB: 1951-01-13                  MRN#:  [id number]  Primary Insurance: [employer] MEDICARE  Secondary Insurance:   Tertiary Insurance:   Financial Class:  Medicare Repl  Date of Admission:  02/04/2019  Precautions: Fall  Weight bearing Precautions:  WBAT    Active Problems  Principal Problem:    Left pontine stroke (HCC)  Active Problems:    TIA (transient ischemic attack)    HTN (hypertension)    Tremors of nervous system    T2DM (type 2 diabetes mellitus) (HCC)    History of neck surgery    Gait abnormality      ???  ASSESSMENT & PLAN:  ???   ???    Mitchell Lucero is a 68 y.o. male admitted to [facility] on 02/04/2019 with the following issues:    Left pontine stroke (CVA), late effects below  > dysarthria, RUE weakness/involuntary movement  > RLE weakness/involuntary movements  > impaired gait, mobility and ADLs  > impaired balance with increased risk of falls  > impaired endurance, impaired cognition    Impairments: cognitive impairments, dysarthria, loss of coordination, poor activity tolerance and weakness  Activity Limitations:  grooming, bathing, dressing - lower, toileting, transfers, ambulation and stairs  Participation Restrictions: unable to return home safely     REHABILITATION PLAN  Patient will be admitted to inpatient rehabilitation for comprehensive therapies to include Physical therapy, Occupational therapy and Speech therapy  Rehabilitation Prognosis: Good    Tolerance for three hours of therapy a day: Good    Goals/Barriers/Facilitators  Family / Patient Goals: return home with family supervision  Mobility Goals: Overall goal is Supervision  Activities of Daily Living (ADLs) Goals: Overall goal is Supervision  Cognition / Communication Goals: Speech therapy will evaluate and treat cognition and communication deficits and assess for safe swallow    Barriers & Interventions: Caregiver Apprehension: Arrange caregiver support and discuss barriers and patient progress with caregivers when appropriate.  Financial Resources: Determine resources availability and Explore financial assistance options.  High Burden of Care: Initiate interdisciplinary rehabilitation to improve functional independence and reduce burden of care.  Facilitators: good home setup, controlled pain, treated depression / anxiety, good family / social support and patient motivation      MEDICAL PROBLEMS AND CO-MORBIDITIES:       NEURO  Left pontine stroke (CVA), 12/18/2018  Encephalopathy s/p L pontine CVA   > seen at [facility] [place]   > treated and sent to rehab, dc from rehab and had multiple falls   > worsening dysarthria, RUE weakness/involuntary movement noted on 01/31/2019    > transferred to [facility]    > Routine EEG shows mild encephalopathy, no epileptiform activity    > neurology ddx includes TIA vs neurodegenerative condition  - MRI brain: with Signal abnormality without atrophy involving the left aspect of the  pons and left middle cerebellar peduncle corresponding with his recent pontine infarct.lack of significant atrophy somewhat goes against brainstem/cerebellar neurodegenerative diseases.  - MRI C- Spine: ???Prior ACDF at C6-C7 without high-grade central or neural foraminal   stenosis at the operated level.Multilevel central spinal stenosis, most pronounced of suspected at  least moderate degree at C4-C5.Multilevel degenerative neural foraminal narrowing.  - CTA brain Dense cavernous carotid calcification, ???Short segment occlusion or high-grade stenosis of the right P2 arterial segment, ???Dense atherosclerotic calcification within the V4 segment segments.  (moderate or high grade stenosis) , Mild stenosis of the left M1 origin.  - CTA neck : ???Mixed, predominantly soft plaque atherosclerosis of the right ICA origin resulting in less than 50% stenosis by NASCET criteria. No significant left carotid stenosis by NASCET criteria.???Atherosclerotic calcification at the right vertebral artery origin resulting in at least mild stenosis.    > per neuro: significant ischemic white matter disease although underlying MSA or other Parkinsonism cannot be ruled out at this time.    > f/u OP with Mov Disorders neurology/requested     > continue ASA, Plavix, Statin  Deconditioning and weakness s/p L pontine CVA    > BLE weakness/involuntary movements   > impaired gait, mobility and ADLs   > impaired balance with increased risk of falls   > impaired endurance    > consult OT, PT    CARDIO:  HLD, HTN, but current hypotension episodes   > some SBP 160s, otherwise light headed and dizziness orthostatic   > TTE normal EF, moderate LA dilation, no thrombus/shunt.   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 Skin rash posterior thighs.   Bowel: continent of bowel  Bladder: continent of bladder  Nutrition: Current diet:  diabetic Mental Health: consult neuropsychology to provide support / counseling  DVT Prophylaxis: Heparin   Code: Full code  DC Plans: TBD    Sat No changes  02/06/2019  Rash: schedule hydrocortisone cream  Glaucoma: family to bring home meds for resumption of therapy      Subjective      Pt reports new onset of a nonpainful, non-itchy rash on post thighs and another rash on dorsum of feet. No there changes, slept well, regular BM. Concerned about medication for glaucoma.       Medications:  Scheduled Meds:aspirin chewable tablet 81 mg, 81 mg, Oral, QDAY  atorvastatin (LIPITOR) tablet 80 mg, 80 mg, Oral, QHS  citalopram (CeleXA) tablet 20 mg, 20 mg, Oral, QDAY  clopiDOGrel (PLAVIX) tablet 75 mg, 75 mg, Oral, QDAY  docusate (COLACE) capsule 100 mg, 100 mg, Oral, BID  heparin (porcine) PF syringe 5,000 Units, 5,000 Units, Subcutaneous, Q8H  insulin aspart U-100 (NOVOLOG FLEXPEN) injection PEN 0-6 Units,  0-6 Units, Subcutaneous, ACHS (22)  insulin glargine (LANTUS SOLOSTAR) injection PEN 10 Units, 10 Units, Subcutaneous, QHS  lactulose oral solution 20 g, 30 mL, Oral, TID  melatonin tablet 3 mg, 3 mg, Oral, QHS  midodrine (PROAMATINE) tablet 1.25 mg, 1.25 mg, Oral, TID (02-23-16)  pantoprazole DR (PROTONIX) tablet 20 mg, 20 mg, Oral, QDAY(21)  senna (SENOKOT) tablet 2 tablet, 2 tablet, Oral, QHS  sodium chloride PF 0.9% flush 3-5 mL, 3-5 mL, Flush, FLUSH TID  vitamin A & D topical ointment, , Topical, BID    Continuous Infusions:  PRN and Respiratory Meds:acetaminophen Q4H PRN, aluminum/magnesium hydroxide Q4H PRN, hydrocortisone TID PRN, milk of magnesia (CONC) Q4H PRN, ondansetron Q6H PRN, traZODone QHS PRN        ???  PHYSICAL EXAM:  ???   ???    BP: 123/73 (07/26 0856)  Temp: 36.8 ???C (98.2 ???F) (07/26 0432)  Pulse: 87 (07/26 0856)  Respirations: 18 PER MINUTE (07/26 0432)  SpO2: 96 % (07/26 0432)    Body mass index is 27.86 kg/m???.    Gen: calm, appears healthy A&Ox4  HEENT: NCAT, PERRL, +glasses, + visual tracking Neck: Supple, adequate ROM  Heart: well perfused  Lungs: no distress, speaks in complete sentences  Abdomen: nondistended  GU:  No foley.  Skin: hair and nails present post thighs:Flat generalised scattered petechiae with confluence on post thighs,  Dora sl feet: scattered well demarcated petechiae  Ext: No pedal edema + pulses palbable  AVW:UJWJ, generalized degenerative ghanges  Neuro: no tremor, SILT memory intact  ???  OBJECTIVE DATA:  ???   ???     Intake/Output Summary:    Intake/Output Summary (Last 24 hours) at 02/06/2019 1914  Last data filed at 02/06/2019 7829  Gross per 24 hour   Intake ???   Output 800 ml   Net -800 ml       Stool Occurrence: 1 (02/05/2019  1:40 PM)    Last Bowel Movement Date: 02/05/19 (02/05/2019  7:47 PM)    No data recorded  No data recorded  No data recorded  No data recorded  Oral Diet Order: Diabetic 2100-2400 Kcal/day (75 g carb/meal, 30 g carb/HS snack) (02/05/2019  1:35 PM)      Basic Metabolic Profile      Radiology:    Reviewed. No new images    Chart reviewed,   Burney Gauze, DO  /02/06/2019  9:22 AM

## 2019-02-07 ENCOUNTER — Encounter: Admit: 2019-02-07 | Discharge: 2019-02-07

## 2019-02-07 DIAGNOSIS — C220 Liver cell carcinoma: Secondary | ICD-10-CM

## 2019-02-07 LAB — ALPHA-1-ANTITRYPSIN, TOTAL: Lab: 165

## 2019-02-07 LAB — POC GLUCOSE
Lab: 164 mg/dL — ABNORMAL HIGH (ref 70–100)
Lab: 104 mg/dL — ABNORMAL HIGH (ref 70–100)
Lab: 178 mg/dL — ABNORMAL HIGH (ref 70–100)
Lab: 222 mg/dL — ABNORMAL HIGH (ref 70–100)

## 2019-02-07 LAB — BASIC METABOLIC PANEL CELLULAR THERAPEUTICS: Lab: 138 MMOL/L — ABNORMAL LOW (ref 137–147)

## 2019-02-07 LAB — CBC CELLULAR THERAPEUTICS: Lab: 6.1 10*3/uL — ABNORMAL LOW (ref 4.5–11.0)

## 2019-02-07 MED ORDER — BIMATOPROST 0.03 % OP DROP
1 [drp] | Freq: Every day | OPHTHALMIC | 0 refills | Status: DC
Start: 2019-02-07 — End: 2019-02-21
  Administered 2019-02-08: 03:00:00 1 [drp] via OPHTHALMIC

## 2019-02-07 NOTE — Progress Notes
SPEECH-LANGUAGE PATHOLOGY     02/07/19 1000   Problem Solving Goals   Therapy Activities Problem solving/reasoning;Math skills   Math Skills Moderate prompting - patient able to 25-49% of the time   Therapy Activity Comments Initiated the ALFA. Patient unable to perform word math problems without max cues.  Patient exhibited difficulty processing information.  Reading instructions:  7/10 mod cues.  Patient exhibited significant difficulty both with visual and auditory problem solving skills for ADL's.  Mrs. Olveda reported that the severity of the difficulty he was having with simple math and comprehending simple instructions is new.    Patient Will Provide Appropriate Solutions to Problems of Daily Living with Progressing   Patient Will Be Able to Problem Solve Safety and Awareness for Daily Living Skills with Progressing    Arna Snipe.A.L/CCC/SLP

## 2019-02-07 NOTE — Rehab Care Plan
Physical Medicine & Rehabilitation Individualized Overall Plan of Care     Date of Service:  02/07/2019   Mitchell Lucero is a 68 y.o. male.     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 Patient presented???as a transfer to [facility] for transient episode of dysarthria associated with right arm large amplitude, irregular involuntary movement in both legs shaking yet retained awareness. Patient has been previously having some increasing number of falls at home. Patients hospital course has been complicated by anemia, thrombocytopenia, hyponatremia, as well as impaired mobility and activities of daily living.   ???  Patient has been working with physical and occupational therapy since 02-01-19 and has been making functional gains. Patient is anticipated to return to home at the modified independent level of care. Patient will also receive formal cognitive evaluation to assess for baseline status and any new cognitive/linguistic deficits.      Rehab Diagnosis: stroke     Medical Course since IRF Admission:    The patient???s clinical course since admission has been stable.  The patient has been able to participate fully in the rehabilitation program.    Medical Prognosis: Fair    The patient has a reasonable likelihood of fully participating in and completing the IRF stay based on ability to manage medical issues including rash, hypertension, cirrhosis, DM.  There is a reasonable expectation of several years of productive life in a community setting in which they will benefit from this stay.     This patient meets medical necessity requiring the intensity of therapy available at the [facility]. The patient can participate in and can fully benefit from the services offered in the IRF setting including 24 hour rehabilitation nursing, daily oversight from the Physiatrist, and complex interdisciplinary rehab as noted below.    Functional Independence Measures (FIMS) Current Level of Function  Evaluation FIMS Current FIMS     Eating FIM: 5 - Set up with containers  Grooming FIM: 5 - Verbal cues or coaxing  Bathing FIM: 1 - Total assistance, patient performs less than 25% of grooming/bathing/dressing/toileting tasks  Dressing - Upper Body FIM: 3 - Moderate assistance, patient performs 50-74% or more of grooming/bathing/dressing/toileting tasks  Dressing - Lower Body FIM: 1 - Total assistance, patient performs less than 25% of grooming/bathing/dressing/toileting tasks  Toileting FIM: 3 - Moderate assistance, patient performs 50-74% or more of grooming/bathing/dressing/toileting tasks  Bladder FIM: 6 - Modified independence - Device  Bowel FIM: 6 - No bowel movement, medication  Transfers FIM: 4 - Contact guard assistance  Toilet Transfers FIM: 3 - Moderate assistance, patient performs 50-74% of transferring tasks (lifting required)     Shower Transfers FIM: 3 - Moderate assistance, patient performs 50-74% of transferring tasks (lifting required)  Gait: 1 - Toal assistance, two or more people, < 50 feet     Stairs FIM: 0 - Activity did not occur - Other (comment)  Comprehension FIM: 5 - Standby prompting. Understands abstract and/or basic information greater than 90% of the time, prompting less than 10%  Expression FIM: 5 - Standy prompting - Able to express abstract and/or basic information >90% of the  time, prompting <10%  Social Interaction FIM: 6 - Modified independence - Able to interact appropriately, only occasionally loses control and able to self-correct  Problem Solving FIM: 3 - Moderate prompting - Able to solve routine problems 50-74%. Needs direction less than half of the time to initiate, plan or complete daily activities  Memory FIM: 2 - Maximal promting - Able to recognize people frequently encountered, remembers daily routines, responds to requests of others 25-49% of the time, needs prompting less than half of the time   Eating FIM: 5 - Set up with containers  Grooming FIM: 5 - Verbal cues or coaxing  Bathing FIM: 1 - Total assistance, patient performs less than 25% of grooming/bathing/dressing/toileting tasks  Dressing - Upper Body FIM: 3 - Moderate assistance, patient performs 50-74% or more of grooming/bathing/dressing/toileting tasks  Dressing - Lower Body FIM: 1 - Total assistance, patient performs less than 25% of grooming/bathing/dressing/toileting tasks  Toileting FIM: 2 - Maximal assistance, patient performs 25-49% of grooming/bathing/dressing/toileting tasks  Bladder FIM: 1 - Total assistance, patient expends less than 25% effort (complete % of assist, if change diapers, total assist)  Bowel FIM: 6 - Modified independence - Medication  Transfers FIM: 3 - Moderate assistance, patient performs 50-74% of transferring tasks (lifting required)  Toilet Transfers FIM: 3 - Moderate assistance, patient performs 50-74% of transferring tasks (lifting required)     Shower Transfers FIM: 3 - Moderate assistance, patient performs 50-74% of transferring tasks (lifting required)  Gait: 1 - Toal assistance, two or more people, < 50 feet     Stairs FIM: 0 - Activity did not occur - Other (comment)  Comprehension FIM: 5 - Standby prompting. Understands abstract and/or basic information greater than 90% of the time, prompting less than 10%  Expression FIM: 5 - Standy prompting - Able to express abstract and/or basic information >90% of the time, prompting <10%  Social Interaction FIM: 4 - Minimal direction - Able to interact appropriately 75-90%  Problem Solving FIM: 3 - Moderate prompting - Able to solve routine problems 50-74%. Needs direction less than half of the time to initiate, plan or complete daily activities  Memory FIM: 2 - Maximal promting - Able to recognize people frequently encountered, remembers daily routines, responds to requests of others 25-49% of the time, needs prompting less than half of the time     Physical Therapy Goals  Patient will perform: household mobility, at ambulation level, Modified independent, Progressing    Occupational Therapy Goals  Pt will perform basic care and transfer with: Modified independence(least restrictive device, assistance with IADLs)    Speech Therapy Goals              Patient Will Use Appropriate Memory Strategies to Recall Recent Events, Express Needs, and Maintain Safety for Daily Living with: Minimum assist     Patient Will Demonstrate Attention Skills to Improve Safety with Daily Activities and Communication with: Minimum assist     Patient Will Be Able to Problem Solve Safety and Awareness for Daily Living Skills with: Progressing                                                                      Nursing Goals  modified independent    Additional  therapeutic disciplines may be included during this stay if indicated during interdisciplinary communication and will be noted in the daily progress notes when relevant.  Social Work will address discharge planning needs.    Rehabilitation Plan     Patient will receive Physical therapy, Occupational therapy and Speech therapy each 60 minutes a day, 5 days a week for a total of 3 hours daily (minimum) for the duration of the rehabilitation stay within an interdisciplinary rehabilitation program with case manager/social worker, dietician, neuropsychologist, rehab nursing and PM&R oversight.    Rehabilitation Prognosis: Good, anticipate steady functional gains with PT, OT, and SLP to return home with family support.  Tolerance for three hours of therapy a day: Good, patient is tolerating 3 hours of therapy per day, as required.    Goals/Barriers/Facilitators Family / Patient Goals: Patient would like to regain strength and endurance to safely return home.   Mobility Goals: Updated per therapy evaluation as above  Activities of Daily Living (ADLs) Goals: Updated per therapy evaluation as above.  Cognition / Communication Goals: Updated per therapy evaluation as above.    Barriers & Interventions:   Caregiver Apprehension: Arrange caregiver support and discuss barriers and patient progress with caregivers when appropriate.  High Burden of Care: Initiate interdisciplinary rehabilitation to improve functional independence and reduce burden of care.  Medication Education:  Pharmacist and nursing staff to provide education to patient and family regarding medication side effects, special precautions, and safe administration.  Facilitators: good home setup, good family / social support, patient motivation, improving strength / endurance and improving medical condition  ???  Discharge Planning  Expected Length of Stay 14 day(s)  Expected Discharge Disposition Home  Expected Discharge Needs: Patient would benefit from ongoing physical, occupational and speech therapy at the outpatient level of care. Patient already owned a stair glide, grab bars in shower, shower chair and a walker prior to hospitalization; however physical and occupational therapy will determine most appropriate equipment needs upon discharge to home to improve upon safety and decrease risk of falls.      The patient should reach their current goals by noted projected discharge date.    This plan of care was formulated based upon a review of the progress this patient made with therapies during the acute hospital stay, the goals set by the inpatient rehabilitation therapists at the time of their initial assessment, and my expertise in caring for patients with this rehabilitation diagnosis and accompanying comorbidities.    Su Monks, MD  02/07/19 3:39 PM

## 2019-02-07 NOTE — Case Management (ED)
Case Management Admission Assessment    NAME:Mitchell Lucero                          MRN: [id number]             DOB:06-08-1951          AGE: 68 y.o.  ADMISSION DATE: 02/04/2019             DAYS ADMITTED: LOS: 3 days      Today???s Date: 02/07/2019    Source of Information: Pt and pt's wife Mitchell Lucero       Plan  Plan: Case Management Assessment, Assist PRN with SW/NCM Services, Discharge Planning for Home Anticipated     SW met with pt and pt's wife Mitchell Lucero to complete assessment and education on Rehab team conference. Covering SW provided primary SW, Olivia, contact information.     Pt and Mitchell Lucero report their main concern is pt's cancer follow up. SW reviewed that pt is scheduled for an appointment at the [facility] on 8/7.    Patient Address/Phone  [address] [place] [address]  [phone] (home)     Emergency Contact  Extended Emergency Contact Information  Primary Emergency Contact: Lucero,Mitchell  Mobile Phone: [phone]  Relation: Spouse  Secondary Emergency Contact: Lucero,Mitchell  Mobile Phone: [phone]  Relation: Daughter    Healthcare Directive  Healthcare Directive: No, patient does not have a healthcare directive  Would patient like to fill out a (a new) Healthcare Directive?: No, patient declined  Psych Advance Directive (Psych unit only): No, patient does not have a Social research officer, government  Does the patient need discharge transport arranged?: No  Transportation Name, Phone and Availability #1: Pt's wife Mitchell Lucero  Does the patient use Medicaid Transportation?: No    Expected Discharge Date  Expected Discharge Date: 02/23/19    Living Situation Prior to Admission  ? Living Arrangements  Type of Residence: Home, with Home Health or other assistance  Living Arrangements: Spouse/significant other, Family members(Pt lives with his wife Mitchell Lucero and mom Psychologist, clinical)  Financial risk analyst / Tub: Psychologist, counselling  How many levels in the residence?: 2 Can patient live on one level if needed?: Yes  Does residence have entry and/or side stairs?: No(No entry steps through the garage, stair glide to main level)  Assistance needed prior to admit or anticipated on discharge: Yes  Who provides assistance or could if needed?: Pt's wife Mitchell Lucero and [facility]   Are they in good health?: Yes  Can support system provide 24/7 care if needed?: No     Pt lives with his wife Mitchell Lucero and mother Mitchell Lucero in a split level home with no entry steps through the garage. There is a stair lift from the lower to the upper level, with a landing where pt has to transfer from one lift to the other. Once upstairs, pt is able to stay on that level. Pt's bathroom is not accessible by walker, but is small enough for him to wall surf, with a standard toilet, and walk in shower with shower chair and grab bars.   Pt reports being somewhat independent with ADL's, with the use of his roller walker prior to admission. Pt was fully independent with ADL's before his initial stroke in June 2020. Before this admission, pt was mobilizing with his roller walker, but had frequent falls, which he and Mitchell Lucero feel is related to his blood pressure. Pt was able to  managing his own self care tasks. Mitchell Lucero was assisting with IADL's, including medication management. Pt does not drive.   Mitchell Lucero is pt's main support and she works full time, but her job is about 5 minutes from the house and she can get to pt quickly. Pt spends his days with his mom Mitchell Lucero, who is disabled, but able to care for herself. Pt's adult children, Mitchell Lucero and Mitchell Lucero, are involved and supportive, but work full time. Mitchell Lucero lives next door to pt. Pt owns a roller walker, shower chair, single point cane, and stair glide.     ? Level of Function   Prior level of function: Needs assist with ADLs  Which ADLs require assistance?: Mobility, medication management, housekeeping, meal prep, transportation.   Who assists with ADLs?: Pt's wife Mitchell Lucero and [facility] ? Cognitive Abilities   Cognitive Abilities: Alert and Oriented    Financial Resources  ? Coverage  Primary Insurance: Medicare Replacement  Secondary Insurance: No insurance  Additional Coverage: RX    ? Source of Income   Source Of Income: Other retirement income  ? Financial Assistance Needed?  None    Psychosocial Needs  ? Mental Health  Mental Health History: No  ? Substance Use History  Substance Use History Screen: No  ? Other  None    Current/Previous Services  ? PCP  Mitchell Lucero, [phone], [phone]     Pt reports seeing his PCP about a month prior to admission.     ? Pharmacy    [facility] [address] [place] - [address] [place] [address] [place] [address]  [place] [address]  Phone: [phone] Fax: [phone]    ? Durable Psychologist, educational at home: [employer], Shower Chair, Single [employer], Other (comment)(Stair glide)  ? Home Health  Receiving home health: Yes  Agency name: [facility]  Would patient use this agency again?: Yes  ? Hemodialysis or Peritoneal Dialysis  Undergoing hemodialysis or peritoneal dialysis: No  ? Tube/Enteral Feeds  Receive tube/enteral feeds: No  ? Infusion  Receive infusions: No  ? Private Duty  Private duty help used: No  ? Home and Community Based Services  Home and community based services: No  ? Mitchell Lucero White  Mitchell Lucero White: No  ? Hospice  Hospice: No  ? Outpatient Therapy  PT: No  OT: No  SLP: No  ? Skilled Nursing Facility/Nursing Home  SNF: No  NH: No  ? Inpatient Rehab  IPR: No  ? Long-Term Acute Care Hospital  LTACH: No  ? Acute Hospital Stay       Teodora Medici, LMSW  Phone: [phone]  Pager: [phone]

## 2019-02-07 NOTE — Progress Notes
Rash from BLE has also spread up to trunk and mid back; RN assessed this at bedtime. Pt's wife at the bedside and stating this is worse than the previous day. Hydrocortisone cream applied per orders. Pt still denies pain or itching to rash areas. Will CTM overnight.

## 2019-02-07 NOTE — Progress Notes
Pharmacy Note: Patients Own Med    The following medications have been identified by pharmacy and placed in the locked medication box for storage:    Lumigan (bimatoprost) 0.01%  LOT [id number]    The patient may use their own supply of these medications during this admission.  All doses should be administered and documented per hospital policy.

## 2019-02-07 NOTE — Progress Notes
OCCUPATIONAL THERAPY     02/07/19 0830   Subjective   Subjective pt pleasant and agreeable to OT session, spouse present for all of session   Grooming   Grooming - Wash Face Assist No   Grooming - Brush Teeth Assist No   Grooming - Wash Both Hands Assist No   Grooming Assist Minimal Assist   Grooming Position Standing for ___ % of task   Grooming Comments increased time needed to set up toothbrush. pt completes all activities in standing with UE support on sink, steadying assist   Upper Body Dressing   UE Dressing Assist Stand By Assist   Upper Dressing Position Sitting edge of bed   Upper Dressing Comments increased time to don t shirt   Lower Body Dressing   LE Dressing Assist Minimal Assist   Lower Dressing Position Sitting edge of bed   Lower Dressing Comments therapist dons compression stocking. seated EOB, pt dons B socks and shorts with significantly increased time. steadying assist in standing to pull up shorts   Transfers   Transfer: Product/process development scientist: Sit to stand assist level Minimum assistance   Transfer: Sit to stand type of assist For safety   Transfer: Stand to sit assist level Minimum assistance   Transfer: Stand to sit type of assist For safety   Transfer: Stand pivot assist level Minimum assistance   Transfer: Stand pivot type of assist For safety;Requires cues for positioning of device   Transfer Comment requires cues for walker safety with pivot transfers   Activity   Therapeutic Activities outcome measures, see below. BITS assessments - bell cancellation 0 errors, 5 min to complete. scanning pattern somewhat organized in a L to R pattern. trail making A - 0 errors, 2:41 to complete   OT Outcome Measures   OT Outcome Measures OT 9-Hole Peg Test;OT Dynamometer   9-Hole Peg Test    Male-Right 1:06.23   Male-Left 1:21.90   9-Hole Peg Test Interpretation significant delay in coordination   Dynamometer   Male-Right 29.3   Male-Left 36 Dynamometer Interpretation significant decreased strength bilaterally   Assessment   Assessment pt with improved ADL participation with increased time this date. continues to require some cues for initiation and increased processing time for cognitive activities   Plan   Plan Comments monitor blood pressure (stockings, abdominal binder), LE dressing, toileting, balance during ADLS functional cognition   Weekly Goals   Patient Will Perform Bathing In Chair;w/ Moderate Assist   Patient Will Perform UE Dressing In Chair;w/ Stand By Assist   Patient Will Perform LE Dressing In Chair;w/ Minimum Assist   Patient Will Perform Grooming Standing at Sink;w/ Minimum Assist   Patient Will Perform Toileting w/ Minimum Assist   Pt Will Transfer To Shower w/ Minimum Assist   Pt Will Transfer To Toilet w/ Minimum Assist   Goals for the stay   Pt will perform basic care and transfer with Modified independence  (least restrictive device, assistance with IADLs)     Lorella Nimrod, OTR/L [phone]

## 2019-02-07 NOTE — Consults
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 Tissue personally delivered to pathology.   - Wound care instructions discussed: Vaseline and bandage qday until healed, Sutures out in 2 weeks  - Will follow pathology results, further recommendations to follow    HPI:  Mr. Peddy is a 68 y/o gentleman with PMHx notable for Hepatocellular carcinoma with decompenstated cirrhosis, Portal HTN, T2DM, HTN. Patient examined with wife at bedside. 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 Patient and wife state they were first made aware of rash on b/l superior thighs on day of admission to [place] [facility]. They note that since first noticing over the past few days, the rash has spread superiorly onto the lower abdomen as well as involve the bilateral dorsal feet. Patient states rash is not bothersome, it is not pruritic or painful. Since first noticing, wife thinks rash has become more faint and improved. He denies a hx of similar rash. Denies trauma to area. No treatments have been tried to this rash. Promotes well balanced diet. Notes he often eats fruits and vegetables.     PMH:  Medical History:   Diagnosis Date   ??? DM (diabetes mellitus) (HCC)    ??? Hypertension    ??? TIA (transient ischemic attack)    ??? Tremors of nervous system    ??? Weight loss        Meds:  aspirin chewable tablet 81 mg, 81 mg, Oral, QDAY  atorvastatin (LIPITOR) tablet 80 mg, 80 mg, Oral, QHS  bimatoprost (LUMIGAN) 0.03 % (+) ophthalmic solution 1 drop (Patien'ts Own Med), 1 drop, Both Eyes, QDAY(21) citalopram (CeleXA) tablet 20 mg, 20 mg, Oral, QDAY  clopiDOGrel (PLAVIX) tablet 75 mg, 75 mg, Oral, QDAY  docusate (COLACE) capsule 100 mg, 100 mg, Oral, BID  heparin (porcine) PF syringe 5,000 Units, 5,000 Units, Subcutaneous, Q8H  hydrocortisone 1 % topical cream, , Topical, TID  insulin aspart U-100 (NOVOLOG FLEXPEN) injection PEN 0-6 Units, 0-6 Units, Subcutaneous, ACHS (22)  insulin glargine (LANTUS SOLOSTAR) injection PEN 10 Units, 10 Units, Subcutaneous, QHS  lactulose oral solution 20 g, 30 mL, Oral, TID  melatonin tablet 3 mg, 3 mg, Oral, QHS  midodrine (PROAMATINE) tablet 1.25 mg, 1.25 mg, Oral, TID (02-23-16)  pantoprazole DR (PROTONIX) tablet 20 mg, 20 mg, Oral, QDAY(21)  senna (SENOKOT) tablet 2 tablet, 2 tablet, Oral, QHS  sodium chloride PF 0.9% flush 3-5 mL, 3-5 mL, Flush, FLUSH TID  vitamin A & D topical ointment, , Topical, BID      acetaminophen Q4H PRN, aluminum/magnesium hydroxide Q4H PRN, milk of magnesia (CONC) Q4H PRN, ondansetron Q6H PRN, traZODone QHS PRN    Allergies:  No Known Allergies    Family History:  Family History   Family history unknown: Yes         ROS:   Const: No concerns per patient  HEENT: No concerns per patient  CV: No concerns per patient  Resp: No concerns per patient  GI: No concerns per patient  GU: No concerns per patient  Neuro: No concerns  per patient   Endo: No concerns per patient  Heme: No concerns per patient  Musculoskeletal: No concerns per patient  Skin: per HPI    PE:  Areas Examined (all normal unless noted below):  Head/Face  Neck  Chest/axillae  Back  Abdomen  R upper ext  L upper ext  R lower ext  L lower ext    Pertinent findings include:  - Numerous scattered erythematous pink petechiae on b/l dorsal feet, b/l anterior superior thigh, bilateral lower abdomen       Punch Biopsy Procedure Note    Risk and benefits of the above procedure including bleeding, pain, dyspigmentation, scar, infection, recurrence, or nerve damage with loss of muscle function and/or skin sensation were discussed with the patient (or legal guardian) in detail, who afterwards decided to proceed with the procedure.    Diagnosis:   Dermatosis A  Body Site: R sup ant thigh x2                   Preparation:  Alcohol   Anesthesia:  1% lidocaine with epinephrine  Instrument:  4 mm punch  Excison:  Lesion fully excised to mid fat or fascia  Hemostasis:  Pressure  Closure: simple    Epidermal: 4-0 Ethilon    Wound dressing: Vaseline  Wound care instructions given  Suture removal in 14 days:  Complications: None  Tolerated well:  Yes  Pathology sent to:  [facility]  Duration of procedure:  >5 minutes    Patient was seen and discussed with Dr. Evie Lacks who agrees with the plan of care as above.  Estelle Grumbles, M.D.   PGY-3  Division of Dermatology

## 2019-02-07 NOTE — Progress Notes
OCCUPATIONAL THERAPY     02/07/19 1300   Subjective   Subjective pt agreeable to OT session, spouse present to observe and engaged in session activities   Transfers   Transfer: Programmer, applications   Transfer: Sit to stand assist level Minimum assistance   Transfer: Sit to stand type of assist For safety   Transfer: Stand to sit assist level Minimum assistance   Transfer: Stand to sit type of assist For safety   Transfer: Stand pivot assist level Minimum assistance   Transfer: Stand pivot type of assist For safety   Transfer Comment cues to recall walker safety and transfer strategies   Activity   Therapeutic Activities balance activities - dynavision mode A x2. delayed reaction time globally, did not appreciate significant difference between quadrants. dynamic reach outside base of support for ring toss, no losses of balance noted   Assessment   Assessment requires increased processing time throughout   Plan   Plan Comments monitor blood pressure (stockings, abdominal binder), LE dressing, toileting, balance during ADLS functional cognition     Irma Newness, OTR/L [phone]

## 2019-02-07 NOTE — Progress Notes
Chaplain Note:    Initial encounter with patient.  Visited during this chaplains rounding hours.  After introducing myself to patient and patient's wife, I informed them that I am the chaplain resident assigned to the Rehab unit.  I asked if there was any support they needed from the spiritual care department at this time.  Both patient and wife declined.  Patient's wife stated that they are active in a [employer] located in [place], [place].  Patient's wife stated that they have been very supportive since patient has been in the hospital.  I informed the patient and the patient's wife that should either of them need a chaplain in the future they could let their nurse know and either I or another chaplain would be paged to come visit.     Admit Date: 02/04/2019        Date/Time:                      User:                                    Pager: [phone]  02/07/2019 11:34 AM Bess Harvest      PCU 3 PCU             The spiritual care team is available as needed, 24/7, through the campus switchboard [phone]).  For immediate response, please page [phone].  For a response within 24 hours, please submit an order in O2 for a chaplain consult.

## 2019-02-07 NOTE — Progress Notes
PHYSICAL THERAPY     02/07/19 1000   Subjective   Subjective Patient presents up in the bedside chair with wife present. Per his wife, he has had 18 recent falls.    Transfers   Transfer: Product/process development scientist: Sit to stand assist level Minimum assistance   Transfer: Sit to stand type of assist For safety   Transfer: Stand to sit assist level Minimum assistance   Transfer: Stand to sit type of assist For safety   Toileting   Toileting-Adjusting clothing BEFORE using toiet, commode, bedpan or urinal Assist No   Toileting-Wipe Self Assist No   Toileting-Adjust clothing AFTER using toilet, commode, bedpan or urinal Assist No   Toileting Assist Minimal Assist   Toileting Position Seated   Toileting Transfer   Toilet Transfer Technique Posterior transfer onto receptacle   Toilet Transfer Assist Minimum assistance   Daily Care   Bladder Management Yes   Patient continent of bladder? Yes, void   Urine Occurrence 1   Gait   Gait Distance 495 feet  (for , 180 ft )   GAIT: Assist Level Minimum assistance   GAIT: Type of Assist For safety   Gait: Hospital doctor;Wheelchair Follow   Gait: Patterns Decreased gait velocity;Trunk lean forward   Gait: Deviation Left Decreased hip flexion;No heel strike/foot flat   Gait: Deviation Right Decreased hip flexion;No heel strike/foot flat   Activity Limited By Complaint of fatigue   Gait Comment Pt exhibits more trunk lean with fatigue.    Stairs   Stair: Assist Level Cues for sequence  (& minimal assist)   Stairs: Number Climbed 12   Stairs Management Technique Sideways   Stairs: Assistive Device One rail   Stairs Activity Limited By Patient choice   Stairs Comment Patient usually rides a stair glide at home.    Curb step Minimum assist;Roller walker  (cues to sequence)   Outcome Measures   6 Minute Walk: Feet in 6 Minutes 495   6 Minute Walk: Device Used roller walker, wheelchair follow 2/2 hx of orthostatic hypotension. Patient is walking 34% of expected distance when adjusted for age, height, weight & sex. Expected distance is 1,443 ft.    10 Meter Walk Assessed   10 Meter Walk: Average Gait Speed (seconds) 26.08   10 Meter Walk: Average Gait Speed (meters/second) 0.38   10 Meter Walk: Comfortable Gait Speed: Device Used roller walker   10 Meter Walk: Comfortable Gait Speed: Comment Minimal assist, gait speed indicates pt has severe walking disability with the need for fall risk intervention.    10 Meter Walk: Average Maximum Gait Speed (seconds) 19.50   10 Meter Walk: Average Maximum Gait Speed (meters/second) 0.51   10 Meter Walk: Maximum Gait Speed: Device Used  Roller walker   10 Meter Walk: Maximum Gait Speed: Comment Minimal assist, gait speed indicates pt has the potential to be a household ambulator.    Assessment   Assessment The patient was pleasant & cooperative for therapy; he currently has significantly impaired gait (slower speed, decreased step length, forward trunk flexion) with a significant fall history. He is appropriate for intensitve therapies to progress his safety & indep with mobility.    Plan   Comments TUG, BBS, 5x sit to stand, ABC. HIGT program.    Recommendations   PT Discharge Recommendations Home with Intermittent Supervision;Home Health Setting;versus;Outpatient Therapy Setting   Equipment Recommendations Patient owns necessary equipment   Expected Discharge Date 02/23/19  Weekly Goals   Patient will perform sit to supine with Modified independent;Progressing   Patient will perform supine to sit with Modified independent;Progressing   Patient will complete sit to stand transfer with Stand by assistance;Progressing   Patient will complete stand to sit transfer with Stand by assistance;Progressing   Patient will complete stand pivot transfer with Stand by assistance;Progressing   Patient will ambulate 150 feet;Minimum assistance;Achieved (new goal: 500 ft SBA with roller walker)   Goal(s) for the Stay   Patient will perform household mobility;at ambulation level;Modified independent;Progressing     Doretha Sou, PT, DPT

## 2019-02-07 NOTE — Progress Notes
SPEECH-LANGUAGE PATHOLOGY     02/07/19 1100   Problem Solving Goals   Therapy Activities Problem solving/reasoning   Problem Solving/Reasoning Visual;Low level;Standby prompting - patient able to > 90% of the time, prompting < 10%   Therapy Activity Comments Worked on visual processing and decision speed.  Patient initially needed time to comprehend task instructions. Performance noted to improve during the course of the task.  Symbols Matching: level 4 of 10: 98% accy.  latency average 41.9 sec. Disucssed with the patient and his wife some activities her can do outside of therapy.      Patient Will Provide Appropriate Solutions to Problems of Daily Living with Progressing   Patient Will Be Able to Problem Solve Safety and Awareness for Daily Living Skills with Progressing    Arna Snipe.A.L/CCC/SLP

## 2019-02-08 ENCOUNTER — Encounter: Admit: 2019-02-08 | Discharge: 2019-02-08

## 2019-02-08 LAB — POC GLUCOSE
Lab: 201 mg/dL — ABNORMAL HIGH (ref 70–100)
Lab: 112 mg/dL — ABNORMAL HIGH (ref 70–100)
Lab: 161 mg/dL — ABNORMAL HIGH (ref 70–100)
Lab: 225 mg/dL — ABNORMAL HIGH (ref 70–100)

## 2019-02-08 LAB — CBC CELLULAR THERAPEUTICS
Lab: 3.9 M/UL — ABNORMAL LOW (ref 4.4–5.5)
Lab: 6.6 K/UL — ABNORMAL LOW (ref >60)

## 2019-02-08 LAB — BASIC METABOLIC PANEL CELLULAR THERAPEUTICS: Lab: 137 MMOL/L — ABNORMAL LOW (ref >60)

## 2019-02-08 NOTE — Progress Notes
PHYSICAL THERAPY     02/08/19 1030   Vitals*   Activity Post Activity   Pulse 89   BP Patient Position Chair   BP 108/72   SpO2 95 %   SpO2 Location Left;Finger, Second (Index)   Pain   Pain Scale 0-10 Scale   Intensity Pain   Intensity Pain Scale (Self Report) 0   Subjective   Subjective Patient initially reports no pain but then reports his back is sore from sitting slouched in the chair. He feels like he is having a shaky day.    Transfers   Transfer: Engineer, manufacturing   Transfer: Sit to stand assist level Minimum assistance;Moderate assistance   Transfer: Sit to stand type of assist For safety;Facilitation of hip extension;Facilitation of weight shift   Transfer: Stand to sit assist level Minimum assistance   Transfer: Stand to sit type of assist For safety   Transfer: Stand pivot assist level Minimum assistance;Moderate assistance   Transfer: Stand pivot type of assist For safety;Facilitation of trunk;Facilitation of weight shift   Gait   Gait Distance 12 feet   GAIT: Assist Level Minimum assistance   GAIT: Type of Assist For safety   Gait: Hospital doctor;Wheelchair Follow   Gait: Patterns Decreased gait velocity;Trunk lean forward   Activity Limited By Complaint of dizziness   Gait Comment BP was 108/72 following walking with pt wearing abdominal binder & ted hose.    Outcome Measures   Berg Balance Scale Assessed   Sit to Stand 3   Standing Unsupported 2   Sitting w/o Back Support, Feet Supported 4   Stand to Sit 0   Transfers 1   Standing Unsupported Eyes Closed 2   Standing Unsupported Feet Together 0   Reaching Forward While Standing 0   Pick Up Object From Floor While Standing 0   Standing, Looking Over L/R Shoulders 3   Turn 360 Degrees 0   Alternate Foot on Step, Standing [place] 0   Tandem Standing 0   Single Leg Stance 0   Berg Balance Score 15 out of 56   Berg Fall Risk At Risk for Falls   Assessment Assessment Patient is a high fall risk per his berg balance score. Gait seemed to be difficult today, but pt also endorses some dizziness with standing activity.    Plan   Comments TUG, 5x sit to stand, ABC. HIGT program.      Doretha Sou, PT, DPT

## 2019-02-08 NOTE — Progress Notes
PHYSICAL THERAPY     02/08/19 1430   Subjective   Subjective Patient reports feeling improved this afternoon compared to this morning.    Transfers   Transfer: Electrical engineer: Sit to stand assist level Minimum assistance   Transfer: Sit to stand type of assist For safety   Transfer: Stand to sit assist level Minimum assistance   Transfer: Stand to sit type of assist For safety   Gait   Gait Distance 10 feet   GAIT: Assist Level Minimum assistance   GAIT: Type of Assist For safety   Gait: Assistive Device Roller Walker   Gait: Patterns Decreased gait velocity;Trunk lean forward   Activity Limited By   (Just walked to wheelchair for transport to gym)   Activity   High Intensity Gait Training Yes   Beta Blocker No   Target Heart Rate 113  (113-137)   Age Predicted Max Heart Rate 161   Minutes Spent in Target Zone 2 mins  (+ 13 min in moderate zone)   Treadmill Activity Overground gait x15 min with BUE support, 1.2% incline, 3# ankle weights at .2-.6 mph. We added small plank step-overs to encourage hip flexion & further drive the HR.    Assessment   Assessment The patient demonstrates shuffling, decreased hip flexion and forward trunk flexion with treadmill gait. He spent most of the time in the moderate zone but was able to gradually increase his stepping initiation to allow for speed progression.    Plan   Comments TUG, 5x sit to stand, ABC. HIGT program.      Doren Custard, PT, DPT

## 2019-02-08 NOTE — Progress Notes
Patient discussed 02/07/19 in [facility] Conference.     Presented by: Forde Radon Jeannine Kitten, MD    Pertinent information reviewed:        a.  CT Abdomen - Internal   Date:  02/03/19        b . Liver Segments/Measurements/LIRADS classifications: segment 7/8 LIRADS 5 lesion        c.  Tbili: 2.0        d. AFP level: 2.3        e. Previous treatment(s) (if applicable):        f.  Proposed recommendations:            1. MWA/TACE and BIOPSY segment 7/8 lesion             2. repeat abdominal imaging 4 weeks post treatment

## 2019-02-08 NOTE — Progress Notes
OCCUPATIONAL THERAPY     02/08/19 0815   Cognitive   Patient Behavior Calm;Cooperative   Cognition Follows Commands   Subjective   Subjective Patient was agreeable to OT session.    Upper Body Dressing   UE Dressing Assist Stand By Assist   Upper Dressing Position Sitting edge of bed   Upper Dressing Comments Dons tee shirt with setup.     Lower Body Dressing   LE Dressing Assist Moderate Assist   Lower Dressing Position Sitting edge of bed   Lower Dressing Garments/Equipment Compression stockings   Lower Dressing Comments Donning shorts and briefs:  Required assist with 2nd foot (Left) into briefs.  Threaded LLE into shorts first, but then placed RLE in same pant leg twice, requiring verbal and manual cues to correct.  Stands with minimal assist and extra time to pull shorts over hips.     Toileting   Toileting-Adjusting clothing BEFORE using toiet, commode, bedpan or urinal Assist No   Toileting-Wipe Self Assist No   Toileting-Adjust clothing AFTER using toilet, commode, bedpan or urinal Assist No   Toileting Assist Stand By Assist   Toileting Position Seated   Toileting Equipment   (Urinal)   Transfers   Transfer: Assistive Device Roller Walker   Transfer: Sit to stand assist level Minimum assistance   Transfer: Sit to stand type of assist Follows precautions;Facilitation of trunk   Transfer: Stand to sit assist level Minimum assistance   Transfer: Stand to sit type of assist For safety   Transfer: Stand pivot assist level Minimum assistance   Transfer: Stand pivot type of assist For safety;Facilitation of trunk;Facilitation of weight shift   Assessment   Assessment Patient continues to require increased time and minimal distractions for processing during basic ADL tasks.     Plan   Plan Comments monitor blood pressure (stockings, abdominal binder), LE dressing, toileting, balance during ADLS functional cognition       Molli Knock, OTR/L

## 2019-02-08 NOTE — Rehab Team Conference
Team Conference Note     Date of Admission:  02/04/2019  Date of Team Conference:  02/08/2019   Mitchell Lucero is a 68 y.o. male.     DOB: Nov 25, 1950                  MRN#:  [id number]    Team Conference   Attendees: Odie Sera MD, Attending Physician; Vinicius Jenetta Loges MD, Resident Physician; George Ina MD, Resident Physician; Randel Pigg RN, Nurse Case Manager; Michaelyn Barter RN, Rehabilitation Program Coordinator; Mellody Dance PhD, ABPP, Neuropsychology; Henrene Dodge OTR, Occupational Therapy; Athena Masse DPT, Physical Therapy; Herbie Baltimore SLP, Speech Therapy; Juanetta Snow PharmD, Pharmacy; Inocencio Homes RD LD, Clinical Nutrition; Chaya Jan, Activity Therapist; Andreas Blower RN, Nursing    Medical Update: 68yo M with a past medical history of hypertension, diabetes type 2, C6-C7 ACDF and orthostasis who presented to [facility] for a transient episode of dysarthria on January 31, 2019.  In addition to dysarthria the patient had associated right arm irregular involuntary movement with altered mental status bilateral lower extremity shaking/weaknesses.  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  He also has a purpura rash along the posterior thigh, Derm consulted. Also hx of liver disease with HCC. Good family support with wife    NeuroPsych: Slow to process and comprehend. Usually just watches tv at home. Feels foggy/fuzzy. Wife manages finances and meds.    Team Goal:  Patient will perform: household mobility, at ambulation level, Modified independent, Progressing   Pt will perform basic care and transfer with: Modified independence(least restrictive device, assistance with IADLs)     Discharge Planning     Discharge Date:  02/22/19 to follow up appointment    ??? Living Arrangements  Type of Residence: Home, with Home Health or other assistance  Living Arrangements: Spouse/significant other, Family members(Pt lives with his wife Mitchell Lucero and mom Psychologist, clinical) Financial risk analyst / Tub: Psychologist, counselling  How many levels in the residence?: 2  Can patient live on one level if needed?: Yes  Does residence have entry and/or side stairs?: No(No entry steps through the garage, stair glide to main level)  Assistance needed prior to admit or anticipated on discharge: Yes  Who provides assistance or could if needed?: Pt's wife Mitchell Lucero and [facility]   Are they in good health?: Yes  Can support system provide 24/7 care if needed?: No   ???  Pt lives with his wife Mitchell Lucero and mother Mitchell Lucero in a split level home with no entry steps through the garage. There is a stair lift from the lower to the upper level, with a landing where pt has to transfer from one lift to the other. Once upstairs, pt is able to stay on that level. Pt's bathroom is not accessible by walker, but is small enough for him to wall surf, with a standard toilet, and walk in shower with shower chair and grab bars.   Pt reports being somewhat independent with ADL's, with the use of his roller walker prior to admission. Pt was fully independent with ADL's before his initial stroke in June 2020. Before this admission, pt was mobilizing with his roller walker, but had frequent falls, which he and Mitchell Lucero feel is related to his blood pressure. Pt was able to managing his own self care tasks. Mitchell Lucero was assisting with IADL's, including medication management. Pt does not drive.   Mitchell Lucero is pt's main support and she works full time,  but her job is about 5 minutes from the house and she can get to pt quickly. Pt spends his days with his mom Mitchell Lucero, who is disabled, but able to care for herself. Pt's adult children, Mitchell Lucero and Mitchell Lucero, are involved and supportive, but work full time. Mitchell Lucero lives next door to pt. Pt owns a roller walker, shower chair, single point cane, and stair glide.   ???  ??? Level of Function   Prior level of function: Needs assist with ADLs  Which ADLs require assistance?: Mobility, medication management, housekeeping, meal prep, transportation.   Who assists with ADLs?: Pt's wife Mitchell Lucero and [facility]  ??? Coverage  Primary Insurance: Medicare Replacement  Additional Coverage: RX    ??? Source of Income   Source Of Income: Other retirement income  ??? PCP  Wilford Grist, [phone], [phone]   Pt reports seeing his PCP about a month prior to admission.   ??? Pharmacy  [facility] - [place], [place] - [address]  ???  ??? Curator at home: [employer], [employer], Single [employer], Other (comment)(Stair glide)  ??? Home Health  Agency name: [facility]    Plan : Home with assistance; outpatient PT/OT/ST, wife reports HH was not helpful in the past.    DME Likely owns what is needed    Rehabilitation Plan     Progress  Limited due to recent admission  Min assist for ADL with significantly increased time  Wife bedside and supportive.    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  Requires simplification, including visual cues and repetition of instructions to assist in comprehension.  RBANS ??? Repeatable Battery for the Assessment of Neuropsychological Status, Form A  Domain Subtest Total Score Index Score Classification Notes   Immediate Memory List Learning 18/40 61 Extremely low ???   ??? Story Memory 9/24 ??? ??? ???   Visuospatial/  constructional Figure Copy 15/20 81 Low average ???   ??? Line Orientation 16/20 ??? ??? ???   Language Picture Naming 10/10 82 Low average  ??? ???   ??? Semantic Fluency 11/40 ??? ??? ???   Attention Digit Span 8/16 TBD TBD ???   ??? Coding TBD/89 ??? ??? ???   Delayed Memory List Recall 2/10 92 Average ???   ??? List Recognition 20/20 ??? ??? ??? ??? Story Recall 5/12 ??? ??? ???   ??? Figure Recall 10/20 ??? ??? ???   Sum of Index Scores ??? ??? ??? ???TBD ???   TOTAL SCALE ??? ??? ???  ???   ???  At this time patient will most likely required consistent supervision and assist with IADL's.  Mrs. Cude manages their finances.   managing orthostasis (TEDs thigh high and abd binder), poor appetite and nausea at times with meals, pt not having BM goal of 3/day despite taking stool softeners, laxatives, and lactulose TID    Plan  TUG, BBS, 5x sit to stand, ABC. HIGT program.   monitor blood pressure (stockings, abdominal binder), LE dressing, toileting, balance during ADLS functional cognition   no c/o pain. pt's skin w/ rash on BLEs and trunk, eval'd and biopsy done  7/27 by derm. managing sites w/ hydrocortisone cream. skin fragile with healing skin tears, bruises and bleeds easily. A&D ointment to skin tears. R thigh biopsy site has sutures and covered w/ bandaids. pt calls appropriately, orthostatic when up OOB - safety concern is falls w/ orthostasis.  Up to chair for all meals  Ambulate to bathroom with compression (abdominal binder and ted hose) for toileting minimal assist with RW  Encourage relaxation strategies and monitor mood  Assess patient's ability to self administer insulin     Goals   Weekly Goals  Weekly Bed Mobility Goals: Patient will perform sit to supine with, Patient will perform supine to sit with  Patient will perform sit to supine with: Modified independent, Progressing  Patient will perform supine to sit with: Modified independent, Progressing  Weekly Transfer Goals: Patient will complete sit to stand transfer with, Patient will complete stand to sit transfer with, Patient will complete stand pivot transfer with  Patient will complete sit to stand transfer with: Stand by assistance, Progressing  Patient will complete stand to sit transfer with: Stand by assistance, Progressing  Patient will complete stand pivot transfer with: Stand by assistance, Progressing Weekly Ambulation/Stairs Goals: Patient will ambulate  Patient will ambulate: 150 feet, Minimum assistance, Achieved(new goal: 500 ft SBA with roller walker)  Weekly Goals  ADL Goals: Bathing, Dressing UE, Dressing LE, Grooming, Toileting  Patient Will Perform Bathing: In Chair, w/ Moderate Assist  Patient Will Perform UE Dressing: In Chair, w/ Stand By Assist  Patient Will Perform LE Dressing: In Chair, w/ Minimum Assist  Patient Will Perform Grooming: Standing at Sink, w/ Minimum Assist  Patient Will Perform Toileting: w/ Minimum Assist  Function Transfer Goals: Shower, Music therapist: w/ Minimum Assist  Pt Will Transfer To Toilet: w/ Minimum Assist     Functional Independence Measures   Eating FIM: 5 - Set up assistive device  Grooming FIM: 5 - Verbal cues or coaxing  Bathing FIM: 1 - Total assistance, patient performs less than 25% of grooming/bathing/dressing/toileting tasks  Dressing - Upper Body FIM: 3 - Moderate assistance, patient performs 50-74% or more of grooming/bathing/dressing/toileting tasks  Dressing - Lower Body FIM: 1 - Total assistance, patient performs less than 25% of grooming/bathing/dressing/toileting tasks  Toileting FIM: 1 - Total assistance, patient performs less than 25% of grooming/bathing/dressing/toileting tasks  Bladder FIM: 1 - Total assistance, patient expends less than 25% effort (complete % of assist, if change diapers, total assist)  Bowel FIM: 6 - No bowel movement, medication  Transfers FIM: 3 - Moderate assistance, patient performs 50-74% of transferring tasks (lifting required)  Toilet Transfers FIM: 3 - Moderate assistance, patient performs 50-74% of transferring tasks (lifting required)   Shower Transfers FIM: 3 - Moderate assistance, patient performs 50-74% of transferring tasks (lifting required)  Gait: 1 - Total assistance, patient expends less than 25% effort (comment % of effort), <50 feet Comprehension FIM: 5 - Standby prompting. Understands abstract and/or basic information greater than 90% of the time, prompting less than 10%  Expression FIM: 5 - Standy prompting - Able to express abstract and/or basic information >90% of the time, prompting <10%  Social Interaction FIM: 4 - Minimal direction - Able to interact appropriately 75-90%  Problem Solving FIM: 3 - Moderate prompting - Able to solve routine problems 50-74%. Needs direction less than half of the time to initiate, plan or complete daily activities  Memory FIM: 2 - Maximal promting - Able to recognize people frequently  encountered, remembers daily routines, responds to requests of others 25-49% of the time, needs prompting less than half of the time

## 2019-02-08 NOTE — Progress Notes
Activity Therapy   Length of session: 30 minutes    Subjective     Pt was agreeable to participating in this session. Pt presented with congruent affect.    Objective    Session to support pt with engagement in preferred hobby of playing games with grandchildren. Today we played uno.    Assessment  Pt recalls most rules of game, but has difficulty manipulating cards d/t loss of sensation in fingers.    Plan  Therapist to provide an additional AT session tomorrow to try checkers with pt.       Pauline Aus, Activity Therapist, MA MT-BC  Board Certified - Music Therapist  [employer] @ Pauline Aus

## 2019-02-08 NOTE — Progress Notes
OCCUPATIONAL THERAPY  NOTE       Name: Mitchell Lucero        MRN: [id number]          DOB: 1951-03-10          Age: 68 y.o.  Admission Date: 02/04/2019             LOS: 4 days           02/08/19 1330   Subjective   Subjective Pt upright in recliner upon arrival. I feel much better than this morning. Agreeable to ARAT assessment.    OT Outcome Measures   OT Outcome Measures OT ARAT   OT ARAT   Grasp LUE: Block, 10 cm 3   Grasp LUE: Block, 2.5 cm 3   Grasp LUE: Block, 5 cm 3   Grasp LUE: Block, 7.5 cm 3   Grasp LUE: Cricket Ball 3   Grasp LUE: Sharpening Stone 3   ARAT Grasp LUE Subscore (Out of 18) 18   Grip LUE: Pour Water from One Glass to Another 3   Grip LUE: Displace 2.25 cm Alloy Tube 3   Grip LUE: Displace 1 cm Alloy Tube 3   Grip LUE: Put Washer Over Bolt 3   ARAT Grip LUE Subscore (Out of 12) 12   Pinch LUE: Ball Bearing Held Between Designer, television/film set and Thumb 1   Pinch LUE: Marble Held Between Index Finger and Thumb 2   Pinch LUE: Ball Bearing Held Between Middle Finger and Thumb 2   Pinch LUE: Ball Bearing Held Between Index Finger and Thumb 2   Pinch LUE: Marble Held Between Designer, television/film set and Thumb 2   Pinch LUE: Marble Held Between Middle Finger and Thumb 2   ARAT Pinch LUE Subscore (Out of 18) 11   Gross Movement LUE: Hand to Behind the Head 3   Gross Movement LUE: Hand to Top of Head 3   Gross Movement LUE: Hand to Mouth 3   ARAT Gross Movement LUE Subscore (Out of 9) 9   ARAT LUE Total Score (Out of 57) 50   ARAT LUE Assessment Pt with L hand tremors causing many of pt's decrease in score however pt noting that things slip out of his hands. Confirms numbness in fingertips.    Grasp RUE: Block, 10 cm 3   Grasp RUE: Block, 2.5 cm 3   Grasp RUE: Block, 5 cm 3   Grasp RUE: Block, 7.5 cm 3   Grasp RUE: Cricket Ball 3   Grasp RUE: Sharpening Stone 3   ARAT Grasp RUE Subscore (Out of 18) 18   Grip RUE: Pour Water from One Glass to Another 3   Grip RUE: Displace 2.25 cm Alloy Tube 3 Grip RUE: Displace 1 cm Alloy Tube 3   Grip RUE: Put Washer Over Bolt 3   ARAT Grip RUE Subscore (Out of 12) 12   Pinch RUE: Ball Bearing Held Between Designer, television/film set and Thumb 3   Pinch RUE: Marble Held Between Index Finger and Thumb 3   Pinch RUE: Ball Bearing Held Between Middle Finger and Thumb 3   Pinch RUE: Ball Bearing Held Between Index Finger and Thumb 3   Pinch RUE: Marble Held Between Designer, television/film set and Thumb 2   Pinch RUE: Marble Held Between Middle Finger and Thumb 3   ARAT Pinch RUE Subscore (Out of 18) 17   Gross Movement RUE: Hand to Behind the Head 3   Gross Movement RUE: Hand  to Top of Head 3   Gross Movement RUE: Hand to Mouth 3   ARAT Gross Movement RUE Subscore (Out of 9) 9   ARAT RUE Total Score (Out of 57) 56   ARAT RUE Assessment RUE WFL. No observed difficulties or abnormalities.    Assessment   Assessment Discussed results with patient's primary OT as location of pt's stroke would indicated R sided weakness versus L. Despite this pt still with mostly functional use of BUE.    Plan   OT Plan Balance training;Cognitive training;UE / trunk management;Neuromuscular education   Intensity 90   Plan Comments monitor blood pressure (stockings, abdominal binder), LE dressing, toileting, balance during ADLS functional cognition           Therapist: Cory Munch, OT  Date: 02/08/2019

## 2019-02-08 NOTE — Progress Notes
SPEECH-LANGUAGE PATHOLOGY     02/08/19 1200   Orientation Goals   Therapy Activities Cognitive log score   Cognitive Log Score (Out of 30) 26/30   Therapy Activity Comments Immediate and delayed recall were impaired   Problem Solving Goals   Therapy Activities Problem solving/reasoning   Math Skills Low level;Standby prompting - patient able to > 90% of the time, prompting < 10%   Therapy Activity Comments Verbal situational problem solving for medication management, diabetic management and safety when left  with his elderly mother.  Mildly reduced insight/awareness of safety concerns with regard to his diabetic management.  Patient's wife sets up pill box. Patient takes them when his wife is present in the AM and PM.  Diabetic management is was less structured.  Patient does not alway check his sugars at meal time.  Patient verbalized safety sequences with fair accuracy.  Education needs are ongoing.   Patient Will Provide Appropriate Solutions to Problems of Daily Living with Progressing   Patient Will Be Able to Problem Solve Safety and Awareness for Daily Living Skills with Progressing    Arna Snipe.A.L/CCC/SLP

## 2019-02-09 LAB — POC GLUCOSE
Lab: 123 mg/dL — ABNORMAL HIGH (ref 70–100)
Lab: 96 mg/dL (ref 70–100)
Lab: 123 mg/dL — ABNORMAL HIGH (ref 70–100)
Lab: 194 mg/dL — ABNORMAL HIGH (ref 70–100)

## 2019-02-09 LAB — CBC CELLULAR THERAPEUTICS: Lab: 7.3 K/UL — ABNORMAL LOW (ref 4.5–11.0)

## 2019-02-09 LAB — BASIC METABOLIC PANEL CELLULAR THERAPEUTICS: Lab: 139 MMOL/L — ABNORMAL LOW (ref 137–147)

## 2019-02-09 MED ORDER — MIDODRINE 5 MG PO TAB
2.5 mg | Freq: Three times a day (TID) | ORAL | 0 refills | Status: DC
Start: 2019-02-09 — End: 2019-02-14
  Administered 2019-02-09 – 2019-02-14 (×16): 2.5 mg via ORAL

## 2019-02-09 NOTE — Progress Notes
SPEECH-LANGUAGE PATHOLOGY     02/09/19 1400   Orientation Goals   Therapy Activities Cognitive log score   Cognitive Log Score (Out of 30) 26/30   Therapy Activity Comments Today delayed recall mildly improved.   Patient Will be Oriented (to Person, Place, Time and Situation) to Improve Safety and Awareness for Daily Living with Progressing   Problem Solving Goals   Therapy Activities Problem solving/reasoning   Problem Solving/Reasoning Visual;High level;Minimal prompting - patient able to 50-74% of the time   Therapy Activity Comments Time word problems: Patient was asked to listen to a scenario and locate information on a schedule.  Patient easily confused by complex language.  Moderate cues required to simplify wording.  Patient was given some word search puzzles earlier in the day.  Patient reported he enjoys focusing on a simple acativity when he's not in therapy.  Participating a simple cognitive activities was a goal of his and his wife's.  Patient stated this is something that he enjoys.    Patient Will Provide Appropriate Solutions to Problems of Daily Living with Progressing   Patient Will Be Able to Problem Solve Safety and Awareness for Daily Living Skills with Progressing      Arna Snipe.A.L/CCC/SLP

## 2019-02-09 NOTE — Progress Notes
Activity Therapy   Length of session: 0 minutes    Subjective     Pt preferring to rest when this session was attempted. Pt presented with congruent affect. "I'm done with playing games and things for today. I'm tired."   Objective    Session intended to support pt with engagement in preferred hobby of playing games with grandchildren.    Assessment  Pt fatigued from other therapies today.    Plan  Therapist to continue with 2-3 AT sessions/week to support pt's interest in playing games with his grandchildren.        Pauline Aus, Activity Therapist, MA MT-BC  Board Certified - Music Therapist  [employer] @ Pauline Aus

## 2019-02-09 NOTE — Progress Notes
OCCUPATIONAL THERAPY     02/09/19 0715   Cognitive   Patient Behavior Calm;Cooperative   Cognition Follows Commands   Subjective   Subjective I'm moving kind of slow this morning.   Eating   Eating - Pick Up Utensil Assist No   Eating-[place] with Fork Assist No   Eating-Pick up Finger Foods Assist No   Eating - Scoop Assist No   Eating - Bring to Mouth Assist No   Eating - Chew and Swallow Assist No   Eating - Drink from Cup Assist No   Eating Assist Modified Independent   Eating Position Sitting in chair   Eating Comments Performs own setup once tray placed in front of him.  Primarily uses R hand for all tasks.  Demonstrated small spill when attempted to drink coffee with L hand.    Grooming   Grooming - Wash Both Hands Assist No   Grooming Assist Minimal Assist   Grooming Position Standing for ___ % of task   Grooming Comments Washes hands after toileting with increased time required, especially for retrieving soap from dispenser.  Steadying assist in standing provided.    Upper Body Dressing   UE Dressing Assist Stand By Assist   Upper Dressing Position Sitting edge of bed   Upper Dressing Comments Dons tee shirt after setup.  Close SBA required due to questionable sitting balance on edge of bed.     Lower Body Dressing   LE Dressing Assist Moderate Assist   Lower Dressing Position Other (Comment)  (Sitting edge of bed and on toilet)   Lower Dressing Garments/Equipment Compression stockings   Lower Dressing Comments Donning brief while seated on toilet and donning shorts while seated in WC.  Socks and compression stockings were donned by OT.  Patient attempted to don B socks while seated edge of bed, but unable to reach toes with BUE's.  Requires assist to thread brief over RLE (2nd leg).  Threads shorts over RLE first and then able to thread LLE without physical assist, but cues to avoid placing in R pantleg.  Stands for briefs and shorts with steadying assist and pulls over B hips without assist. Toileting   Toileting-Adjusting clothing BEFORE using toiet, commode, bedpan or urinal Assist Yes  (OT removed brief - tape tabs. )   Toileting-Adjust clothing AFTER using toilet, commode, bedpan or urinal Assist No  (Patient able to pull pull-up over hips.)   Toileting Assist Moderate Assist   Toileting Position Standing   Toileting Comments Steadying assist in standing for pulling briefs up over hips.    Development worker, community Grab bar - left   Toilet Transfer Comments Approaches toilet ambulating with walker.   Daily Care   Patient continent of bowel? No   Stool Occurrence 1   Stool Amount Medium   Stool Appearance Pasty;Soft   Stool Color Clay   Last Bowel Movement Date 02/09/19   Bed Mobility   Bed Mobility: Supine to sit assist level Moderate assistance   Bed Mobility: Supine to sit type of assist Assist with trunk;With rail;With head of bed flat   Bed Mobility: Sit to Supine Assist Level Supervision   Bed Mobility: Sit to supine type of assist With head of bed flat   Transfers   Transfer: Assistive Device Roller Walker   Transfer: Sit to stand assist level Minimum assistance   Transfer: Sit  to stand type of assist For safety   Transfer: Stand to sit assist level Minimum assistance   Transfer: Stand to sit type of assist For safety   Transfer: Stand pivot assist level Minimum assistance   Transfer: Stand pivot type of assist Facilitation of trunk;For safety;Increased time to complete   Transfer Comment Ambulating short distances in room for ADL routine.  After toileting and grooming, patient requesting WC due to feeling shaky. OT encouraged paitent to walk ~10 feet to chair, which he did with minimal to moderate assist and roller walker.     Activity   Therapeutic Activities ADL's in room as described above.   To therapy gym via [facility] for BITS training. Standing at [facility] with walker and minimal assist while using RUE for user paced visual scanning, full board, 1 minute x 2 sets.  Scored 21 hits per set.  Patient then reported that he was incontinent of stool in brief - returned to room.     Assessment   Assessment Patient demonstrates the ability to feed self with RUE and modified independence.  Demonstrates slow visual scanning( all quadrants)/processing speed using BITS.     Plan   Plan Comments monitor blood pressure (stockings, abdominal binder), LE dressing, toileting, visual scanning, balance during ADLS functional cognition     Henrene Dodge, OTR/L

## 2019-02-09 NOTE — Progress Notes
0030: Pt called to use the [facility] but was unable to transfer with min assist x1 w/ RW as supposed to. More assistance was requested to room as pt started to have involuntary movements to the RUE and increased BLE weakness. After a few minutes the movements stopped and pt was helped back to bed with 3 person assist. Pt's spouse reports, " This is not new, it's probably the fifth or sixth time he's having "seizure" like movements." B/P 92/64, P 92. Pt was put back in bed and reports feeling better. Will CTM.

## 2019-02-09 NOTE — Case Management (ED)
Case Management Progress Note    NAME:Mitchell Lucero                          MRN: [id number]              DOB:December 31, 1950          AGE: 68 y.o.  ADMISSION DATE: 02/04/2019             DAYS ADMITTED: LOS: 5 days      Todays Date: 02/09/2019    Plan  Anticipated dc to home on 8/11 and to f/u oncology appointment at 2pm at ambulation level and wc mobility for community distances. Pt needs assistance with medication and finance management. Pt recommended for ongoing PT/OT/ST through hh vs outpt.    Interventions  ? Support   Support: Pt/Family Updates re:POC or DC Plan   SW contacted pt's wife [name] [phone] leaving message with SW call back number to provide team conference updates.    ? Info or Referral   Information or Referral to Community Resources: No Needs Identified  ? Discharge Planning      ? Medication Needs      ? Financial      ? Legal      ? Other        Disposition  ? Expected Discharge Date    Expected Discharge Date: 02/22/19  ? Transportation   Does the patient need discharge transport arranged?: No  Transportation Name, Phone and Availability #1: Pt's wife [name]  Does the patient use Medicaid Transportation?: No  ? Next Level of Care (Acute Psych discharges only)      ? Discharge Disposition          Durable Medical Equipment      No service has been selected for the patient.      [facility]      No service has been selected for the patient.      [place]      No service has been selected for the patient.      [facility]      No service has been selected for the patient.        Oletha Blend, [place]

## 2019-02-09 NOTE — Progress Notes
PHYSICAL THERAPY     02/09/19 1300   Subjective   Subjective Patient presents in the bedside chair ready for therapy.    Transfers   Transfer: Electrical engineer: Sit to stand assist level Contact guard assistance   Transfer: Sit to stand type of assist For safety   Transfer: Stand to sit assist level Contact guard assistance   Transfer: Stand to sit type of assist For safety   Transfer: Stand pivot assist level Contact guard assistance   Transfer: Stand pivot type of assist For safety;Requires verbal cues for sequencing   Daily Care   P.O. 120 mL   Gait   Gait Distance 180 feet  (x2)   GAIT: Assist Level Minimum assistance   GAIT: Type of Assist For safety   Gait: Programmer, applications;Wheelchair Follow   Gait: Patterns Decreased gait velocity;Trunk lean forward   Gait Comment Progressive trunk lean with distance noted. Pt needed cues to stay close to the walker with fatigue. Pt performed resisted walking on the second bout with 3# ankle weights & black theraband resisted pulling.    Stairs   Stair: Assist Level Minimum assistance   Stairs: Number Climbed 12   Stairs Management Technique Sideways;Reciprocal   Stairs: Assistive Device One rail;Two Rails   Stairs Activity Limited By Patient choice   Stairs Comment Patient completed stairs with 3# ankle weights on.    Activity   PT Therapeutic Activities Performed small plank step-overs with roller walker while wearing 3# ankle weights.    Assessment   Assessment Pt tolerated the afternoon session without any symptoms of orthostasis.   Plan   Comments 5x sit to stand, ABC, HIGT program, proximal strengthening, stairs.      Doren Custard, PT, DPT

## 2019-02-09 NOTE — Progress Notes
SPEECH-LANGUAGE PATHOLOGY     02/09/19 0900   Orientation Goals   Therapy Activity Comments Ox3 referred to environmental cues.   Attention Goals   Therapy Activities Sustained attention   Sustained Attention 0-10 minutes;Quiet environment;Minimal prompting - Patient able to 75-90% of the time   Therapy Activity Comments Gave patient a word search. Patient required re-instruction x1 on how to do a word search puzzle. He then continued locating words without assist.  Improved response time today.   Patient Will Complete Sustained Attention Task with Progressing   Patient Will Demonstrate Attention Skills to Improve Safety with Daily Activities and Communication with Progressing   Problem Solving Goals   Therapy Activities Problem solving/reasoning   Problem Solving/Reasoning Visual;Low level;Minimal prompting - patient able to 50-74% of the time   Therapy Activity Comments Reviewed dietary information. Carb counting usnig the specials listed. Had patient add up the carbs.  patient required visual however he was able to perform simple math today w/ only min cues.  Patient identified foods high in carbs and some foods high in protein.  Good attention to task and ability to locate information on written material provided.     Patient Will Provide Appropriate Solutions to Problems of Daily Living with Progressing   Patient Will Be Able to Problem Solve Safety and Awareness for Daily Living Skills with Progressing    Arna Snipe.A.L/CCC/SLP

## 2019-02-09 NOTE — Progress Notes
OCCUPATIONAL THERAPY     02/09/19 1100   Subjective   Subjective Patient was agreeable to participate in OT session.    Transfers   Transfer: Electrical engineer: Sit to stand assist level Minimum assistance   Transfer: Sit to stand type of assist For safety   Transfer: Stand to sit assist level Minimum assistance   Transfer: Stand to sit type of assist For safety   Transfer: Stand pivot assist level Minimum assistance   Transfer: Stand pivot type of assist For safety   Transfer Comment Also ambulated from therapy gym > room with minimal assist and roller walker.     Activity   Therapeutic Activities Addressed fine motor coordination with L hand for removing/replacing small pieces from smal socket wrench set.   Patient did not demonstrated a significant tremor.  Demonstrated good participation and motivation throughout session.     Assessment   Assessment Demonstrating adequate L hand fine motor coordination during this activity.   Plan   Plan Comments LE dressing, toileting, visual scanning, balance during ADLS functional cognition; functional use of BUE's.      Molli Knock, OTR/L

## 2019-02-09 NOTE — Progress Notes
CLINICAL NUTRITION                                                        Clinical Nutrition Follow-Up Assessment     Name: Mitchell Lucero        MRN: [id number]          DOB: Jul 28, 1950          Age: 68 y.o.  Admission Date: 02/04/2019             LOS: 5 days        Recommendation:  Conitnue diet as ordered    Comments:  Pt is a 68 y.o. male with PMH of HTN, DM2, C6-C7 ADCF in 1997, and recent left pontine stroke in 12/18/2018 who was transferred to [facility] for a transient episode of dysarthria for 1.5 hrs in the morning of 7/20 associated with R arm large amplitude irregular, involuntary movement and both leg shaking with retained awareness. Increasing number of falls since discharge from [facility], primarily at night, in the dark, even with walker. Unintentional wt loss of 30# x1 year. Clinical Nutrition following for poor appetite. During this admission pt was also dx with [facility], likely secondary to NASH. Hepatology was following and oncology consulted. IR consulted for liver Biopsy. AFP WNL. Pt was taking metformin PTA, but no longer taking per previous notes. Pt and his wife have previously received diabetic diet education. Pt???s wife managing meals and DM meds since his stroke. Pt now eating 75-100% of all meals this admit. He has no follow-up questions at this time. RD will continue to monitor.                             Nutrition Assessment of Patient:  Admit Weight: 70.3 kg([facility] admission weight); Weight Change Since Admit: +1.3kg  BMI (Calculated): 28.25; BMI Categories Adult: Over Weight: 25-29.9  Pertinent Allergies/Intolerances: none  Pertinent Labs: FSBS 96-225; Pertinent Meds: Reviewed; Unintentional Weight Loss: 20% in 1 year (significant)  Oral Diet Order: Diabetic 2100-2400 Kcal/day (75 g carb/meal, 30 g carb/HS snack);    Current Oral Intake: Marginally Adequate  Estimated Calorie Needs: 1590-1911(25-28 kcal/kg DBW)  Estimated Protein Needs: 76-82(1.2-1.3 g/kg DBW)    Malnutrition Assessment: Does not meet criteria     Nutrition Focused Physical Assessment:  Loss of Subcutaneous Fat: No;  ;    Muscle Wasting: Yes; Severity: Mild; Location: Deltoid, Interosseous, Temple  Edema: No;  ;    Pressure Injury: none noted    Nutrition Diagnosis:  Inadequate oral intake  Etiology: recent poor appetite; slowly improving  Signs & Symptoms: pt and wife reports     Intervention / Plan:  Reviewed DM education, encouraged PO intake, discussed recommendaitons  Montior weight, labs, meds, Gi symptoms, PO intake    Goals:  Patient to consume >75% of meals/supplements  Time Frame: Within 5 days  Status: Met;New timeframe established  Aid in stabilizing blood glucose  Time Frame: Throughout stay  Status: Partially met;Ongoing  Verbalize understanding of diet  Time Frame: Prior to discharge  Status: Ongoing         Francee Nodal, MS, RD, LD   [place]: [phone]  Office Phone: [phone]

## 2019-02-09 NOTE — Progress Notes
PHYSICAL THERAPY     02/09/19 0940   Vitals*   Activity At Rest   Pulse 79   BP Patient Position Chair   BP (!) 142/80  (with compression)   SpO2 96 %   Pain   Pain Scale No Pain   Precautions   Precautions   (orthostatic)   Subjective   Subjective Patient is agreeable to therapy. He is getting some tailbone pain from sitting. Physican team has requested orthostatic vitals from sit to stand.    Transfers   Transfer: Product/process development scientist: Sit to stand assist level Contact guard assistance   Transfer: Sit to stand type of assist For safety   Transfer: Stand to sit assist level Contact guard assistance   Transfer: Stand to sit type of assist For safety   Transfer: Stand pivot assist level Contact guard assistance   Transfer: Stand pivot type of assist For Artist Technique Stand pivot to left   Toilet Transfer Equipment Grab bar - left   Toilet Transfer Comments Used roller walker to transfer.    Daily Care   Urine Output Source Void   Patient continent of bowel? Yes, No Bowel Program   Stool Occurrence 1   Stool Amount Large   Stool Appearance Soft;Loose   Stool Color Clay   Last Bowel Movement Date 02/09/19   Gait   Gait Distance 10 feet  (+ 105 ft)   GAIT: Assist Level Minimum assistance   GAIT: Type of Assist For safety   Gait: Hospital doctor;Wheelchair Follow   Gait: Patterns Decreased gait velocity;Trunk lean forward;Wide base support   Gait: Deviation Left Decreased hip flexion;Decreased dorsiflexion/toe drag in swingphase   Gait: Deviation Right Decreased hip flexion;Decreased dorsiflexion/toe drag in swingphase   Activity Limited By Complaint of fatigue  (& bowel urgency)   Outcome Measures   Timed Up and Go Assessed   Timed Up and Go: Standard (seconds) 46.67   Timed Up and Go: Assistance Used Contact guard asssistance   Timed Up and Go: Assistive Device used [employer] Timed Up and Go Fall Risk  >14 sec increased risk of falls in older stroke patients.    Activity   PT Therapeutic Activities Neurogym x20 reps with UE use & 10# assist, x10 additional reps without UE use and 10# assist.    Assessment   Assessment The patient was orthostatic with compression on this morning. Pt denies dizziness during the episode but instead endorsed leg weakness. He was able to do some ambulation with close monitoring of his symptoms.     Plan   Comments 5x sit to stand, ABC, HIGT program, proximal strengthening, stairs.      Doretha Sou, PT, DPT

## 2019-02-10 LAB — URINALYSIS MICROSCOPIC REFLEX TO CULTURE

## 2019-02-10 LAB — URINALYSIS DIPSTICK REFLEX TO CULTURE
Lab: NEGATIVE
Lab: NEGATIVE
Lab: NEGATIVE
Lab: NEGATIVE
Lab: NEGATIVE

## 2019-02-10 LAB — POC GLUCOSE
Lab: 152 mg/dL — ABNORMAL HIGH (ref 70–100)
Lab: 82 mg/dL (ref 70–100)
Lab: 125 mg/dL — ABNORMAL HIGH (ref 70–100)
Lab: 193 mg/dL — ABNORMAL HIGH (ref 70–100)

## 2019-02-10 MED ORDER — INSULIN GLARGINE 100 UNIT/ML (3 ML) SC INJ PEN
8 [IU] | Freq: Every evening | SUBCUTANEOUS | 0 refills | Status: DC
Start: 2019-02-10 — End: 2019-02-21

## 2019-02-10 NOTE — Progress Notes
OCCUPATIONAL THERAPY     02/10/19 0715   Subjective   Subjective Patient was agreeable to participate in OT session.     Grooming   Grooming - Brush Teeth Assist No   Grooming Assist Stand By Assist   Grooming Position Standing for ___ % of task  (100)   Grooming Comments OT provided close SBA with patient in standing to ensure safety.  Patient uses LUE support on sink countertop.    Upper Body Dressing   UE Dressing Assist Stand By Assist   Upper Dressing Position Sitting in chair   Upper Dressing Garments/Equipment Abdominal binder  (OT dons abdominal binder)   Upper Dressing Comments Doffs/don tee shirt with setup only.    Lower Body Dressing   LE Dressing Assist Stand By Assist   Lower Dressing Position Sitting in chair   Lower Dressing Garments/Equipment Compression stockings   Lower Dressing Comments OT applies thigh high TED hose to BLE.  Patient dons shorts and hospital socks with extra time and setup assist. Demonstrates ability to stand from bedside chair, with SBA and roller walker,  to pull shorts over B hips.       Transfers   Transfer: Programmer, applications   Transfer: Sit to stand assist level Stand by assistance   Transfer: Sit to stand type of assist For safety;Increased time to complete   Transfer: Stand to sit assist level Stand by assistance   Transfer: Stand to sit type of assist For safety   Transfer: Stand pivot assist level Contact guard assistance   Transfer: Stand pivot type of assist For safety   Transfer Comment Ambulates from bedside chair <> bathroom with roller walker and SBA.     Assessment   Assessment Progressing from minimal assist to SBA for dressing and grooming tasks today.     Plan   Plan Comments LE dressing, toileting, visual scanning, balance during ADLS functional cognition; functional use of BUE's.      Molli Knock, OTR/L

## 2019-02-10 NOTE — Progress Notes
SPEECH-LANGUAGE PATHOLOGY     02/10/19 1430   Problem Solving Goals   Therapy Activities Problem solving/reasoning  (New learning)   Problem Solving/Reasoning Visual;Low level;Minimal prompting - patient able to 75-90% of the time   Therapy Activity Comments Taught the card game Blink.  Patient was able to pick up on the rules of the game. Response time noted to increase.  Good sustained attention task for patient. Very little cueing was needed.   Patient Will Provide Appropriate Solutions to Problems of Daily Living with Progressing   Patient Will Be Able to Problem Solve Safety and Awareness for Daily Living Skills with Progressing      Arna Snipe.A.L/CCC/SLP

## 2019-02-10 NOTE — Progress Notes
PHYSICAL THERAPY     02/10/19 1340   Subjective   Subjective Patient presents in the wheelchair after completing toileting with nursing staff.    Transfers   Transfer: Electrical engineer: Sit to stand assist level Contact guard assistance   Transfer: Sit to stand type of assist For safety   Transfer: Stand to sit assist level Contact guard assistance   Transfer: Stand to sit type of assist For safety   Transfer: Stand pivot assist level Contact guard assistance   Transfer: Stand pivot type of assist For safety   Stairs   Curb step Minimum assist;Roller walker  (completed x3 reps up & 1 rep down with CGA)   Activity   Other Activity Dynamic walking tasks: walking up/down ramp with roller walker, over carpet & threshold surfaces, side stepping at rail 12 ft x4, fast walking at rail with 1 UE support on rail & HHA, retrowalking with 1 rail & HHA.    Assessment   Assessment Patient was left up in the bedside chair with a pillow in his seat for tailbone support (reports tailbone pain in chair but wheelchair is also not comfortable). He performs dynamic gait challenges with minimal assist for steadying.    Plan   Comments 5x sit to stand, ABC, HIGT program, proximal strengthening, stairs.      Doren Custard, PT, DPT

## 2019-02-10 NOTE — Progress Notes
PHYSICAL THERAPY     02/10/19 1015   Pain   Pain Scale No Pain   Subjective   Subjective Patient has no complaints and feels like it's a good day so far.    Transfers   Transfer: Product/process development scientist: Sit to stand assist level Contact guard assistance   Transfer: Sit to stand type of assist For safety   Transfer: Stand to sit assist level Contact guard assistance   Transfer: Stand to sit type of assist For safety   Transfer: Stand pivot assist level Contact guard assistance   Transfer: Stand pivot type of assist For safety   Gait   Gait Distance 110 feet  (+ 160 ft + 155 ft)   GAIT: Assist Level Minimum assistance   GAIT: Type of Assist For safety   GaitResearch officer, trade union;Geophysical data processor;Wheelchair Follow  (80# weighted shopping cart)   Gait: Patterns Decreased gait velocity;Trunk lean forward;Wide base support   Gait: Deviation Left Decreased hip flexion   Gait: Deviation Right Knee hyperextension/genu recurvatum;No heel strike/foot flat;Decreased hip flexion   Activity Limited By Complaint of fatigue   Gait Comment During resisted cart walking, pt wore 4# ankle weights.    Activity   High Intensity Gait Training Yes   Target Heart Rate 113  (113-137)   Minutes Spent in Target Zone 8 mins  (high intensity + 22 min in moderate zone)   Treadmill Activity Gait with BUE support for 1) .7 -1.0 mph walking at 1% incline & 4# ankle weights 2) retrowalking with BUE support x at .2 mph, 3) side stepping R 2:30 min L 1 min with BUE support & 4) walking at .8-.9 mph with BUE support. 4# ankle weights used throughout all treadmill gait.    Overground Activity Resisted shopping cart gait as noted above in gait. Minimal assist for steadying with wheelchair follow.    Assessment   Assessment Patient denied any symptoms of orthostasis today and tolerated nearly an hour of gait training.  He was able to tolerate faster treadmill speeds today with resisted walking. Increased R knee hyperextension moment was noted with fatigue. ACE wrap was trialed to keep the foot in a more dorsiflexed position, which only lessened the force of the hyperextension. Upon strength testing, the R quad is 5/5 and dorsiflexion is 4+/5. Likley the R quad is fatiguing with progressive gait.     Plan   Comments 5x sit to stand, ABC, HIGT program, proximal strengthening, stairs, dynamic stepping challenges.      Doretha Sou, PT, DPT

## 2019-02-10 NOTE — Progress Notes
SPEECH-LANGUAGE PATHOLOGY     02/10/19 0900   Problem Solving Goals   Therapy Activities Math skills;Time concepts   Math Skills Low level;Modified independence - mild difficulty but no prompting   Time Concepts Low level;Modified independence - mild difficulty but no prompting   Therapy Activity Comments Problem solving tasks were simplified, patient was given visual information which seemed to benefit him.  Sequencing steps for ADL tasks:  100% accy. Noted patient to slow down and process visual information prior to responding.  Simple clock math: Level 1: 100% accy.  Simple verbal and visual input were presented.  Solving word probmels Level 1 (simple addition and subtraction) patient able to quickly identify how to solve the problem, with visual imput he was able to complete with 100% accy.  Responses today were more timely with improved attention to each task. Visual and auditory processing appeared improved. Patient reported he was having a better day.  Mitchell Lucero is doing word search puzzles that were given yesterday during his free time. He reported that he is interested in doing some cognitive activites such as puzzles and word search puzzels rather than watch TV when he goes home.     Patient Will Provide Appropriate Solutions to Problems of Daily Living with Progressing   Patient Will Be Able to Problem Solve Safety and Awareness for Daily Living Skills with Progressing

## 2019-02-10 NOTE — Progress Notes
OCCUPATIONAL THERAPY     02/10/19 1300   Daily Care   P.O. 120 mL   Assessment   Assessment Patient demonstrating slow visual scanning and processing speed during BITS activities.  Visual scanning appears even in all 4 quadrants.     Plan   Plan Comments LE dressing, toileting, visual scanning, balance during ADLS functional cognition; functional use of BUE's.      Molli Knock, OTR/L

## 2019-02-11 LAB — POC GLUCOSE
Lab: 160 mg/dL — ABNORMAL HIGH (ref 70–100)
Lab: 126 mg/dL — ABNORMAL HIGH (ref 70–100)
Lab: 160 mg/dL — ABNORMAL HIGH (ref 70–100)
Lab: 168 mg/dL — ABNORMAL HIGH (ref 70–100)

## 2019-02-11 LAB — CBC CELLULAR THERAPEUTICS: Lab: 5.7 10*3/uL (ref 4.5–11.0)

## 2019-02-11 LAB — BASIC METABOLIC PANEL CELLULAR THERAPEUTICS: Lab: 137 MMOL/L — ABNORMAL LOW (ref 137–147)

## 2019-02-11 NOTE — Progress Notes
Pt voided at 1330 and had a PVR of 537. I cathed this patient per rehab protocol and I only got 100 out. Another bladder scan read as 420. Pt refuses another cath at this time and is open to another scan after his next void.

## 2019-02-11 NOTE — Progress Notes
OCCUPATIONAL THERAPY     02/11/19 0715   Cognitive   Patient Behavior Calm;Cooperative   Cognition Follows Commands   Subjective   Subjective "I feel pretty good today."   Transfers   Transfer: Programmer, applications   Transfer: Sit to stand assist level Stand by assistance   Transfer: Sit to stand type of assist For safety   Transfer: Stand to sit assist level Stand by assistance   Transfer: Stand to sit type of assist For safety   Transfer: Stand pivot assist level Contact guard assistance   Transfer: Stand pivot type of assist For safety   Transfer Comment Ambulates from room <> therapy gym with roller walker and CGA.     Activity   Therapeutic Activities Seated on edge of therapy mat while engaged in task specific training for BUE's (unilaterally):  LUE - One repetition consisted of retrieving tackle box item from table (positioned anteriorly and at waist height), transporting object to tackle box positioned over shoulder height; and releasing into compartments in tackle box.  Items consisted of fishing weights, bobbers ranging from small to large sizes, etc.  Performing 50 repetitions and was instructed to incorporate middle finger into prehension pattern for last half of repetitions.   RUE:  Retrieving tackle box items from inside tackle box positioned on table at waist height, transporting to above shoulder height surface and releasing onto surface.  Performing 50 reps with RUE.   Mild tremor noted in LUE, but primarily ulnar aspect of hand.    Assessment   Assessment Patient demonstrating good tolerance for performance of repetitive task performance with each UE, consisting of both gross and fine motor skills.     Plan   Plan Comments LE dressing, toileting, visual scanning, balance during ADLS functional cognition; functional use of BUE's.      Molli Knock, OTR/L

## 2019-02-11 NOTE — Case Management (ED)
Case Management Progress Note    NAME:Mitchell Lucero                          MRN: [id number]              DOB:April 08, 1951          AGE: 68 y.o.  ADMISSION DATE: 02/04/2019             DAYS ADMITTED: LOS: 7 days      Today???s Date: 02/11/2019    Plan  Anticipated discharge to home on 811 at mod I with a walker for ambulation and wheelchair community distances mod I.  Patient recommended for ongoing PT OT.  Family request home health services.    Interventions  ? Support   Support: Pt/Family Updates re:POC or DC Plan   Social worker spoke with wife Mitchell Lucero to provide team conference updates.  Mitchell Lucero in agreement with plan for discharge on 8/11 and to discharge directly to oncology follow-up appointment at 2 PM.    Social work educated team would order wheelchair and she confirmed they own roller walker.    Social worker discussed recommendation for ongoing physical and occupational therapy through outpatient therapy, however Mitchell Lucero reports she cannot provide transportation and would prefer home health services.  Patient has used [facility] services in the past and would like to use again.    Mitchell Lucero plans to take off Monday, August 10 and be on unit if any training is needed and will discharge near 1 PM on Tuesday, August 11.    Wife Mitchell Lucero states she has been managing medications and finances prior to admission and does not require diabetic education.  Social work educated team was attempting to determine if patient is able to administer his own insulin.  Mitchell Lucero confirmed patient's mom is at home with patient during the day and she is physically disabled but can help with medications. Mitchell Lucero plans to have pt take his blood sugar and then call her so she can help with the amount given.    Mitchell Lucero voiced she would like for staff to continue to encourage patient to be active once he returns home as she has a concern he will not do much at home.    ? Info or Referral Information or Referral to Community Resources: No Needs Identified  ? Discharge Planning  ?  Discharge Planning: Home Health, Durable Medical Equipment and Supplies     Social work sent home health referral to [facility].    Medication Needs   Medication Needs: No Needs Identified  ?  Financial   Financial: No Needs Identified  ? Legal   Legal: No Needs Identified  ? Other        Disposition  ? Expected Discharge Date    Expected Discharge Date: 02/22/19  ? Transportation   Does the patient need discharge transport arranged?: No  Transportation Name, Phone and Availability #1: Pt's wife Mitchell Lucero  Does the patient use Medicaid Transportation?: No  ? Next Level of Care (Acute Psych discharges only)      ? Discharge Disposition        Durable Medical Equipment      No service has been selected for the patient.      [facility] Destination      No service has been selected for the patient.      [facility]      No service  has been selected for the patient.      [facility]      No service has been selected for the patient.        Hilarie Fredrickson, LMSW   [phone]  [phone]

## 2019-02-11 NOTE — Progress Notes
PHYSICAL MEDICINE & REHABILITATION    PROGRESS NOTE        Today's Date:  02/11/2019  Admission Date: 02/04/2019  LOS: 7 days  Insurance: [employer] MEDICARE  Date of Admission:  02/04/2019   Precautions: Fall,   Weight bearing Precautions:  WBAT    Active Problems:  Principal Problem:    Left pontine stroke (HCC)  Active Problems:    Orthostatic hypotension    HTN (hypertension)    Tremors of nervous system    T2DM (type 2 diabetes mellitus) (HCC)    History of neck surgery    Gait abnormality    Right hemiparesis (HCC)    Cirrhosis (HCC)            Assessment & Plan:        Mitchell Lucero is a 68 y.o. male admitted to [facility] on 02/04/2019 with the following issues:    Left pontine stroke (CVA), late effects below  > dysarthria, RUE weakness/involuntary movement  > RLE weakness/involuntary movements  > impaired gait, mobility and ADLs  > impaired balance with increased risk of falls  > impaired endurance, impaired cognition  ???  Rehabilitation Plan  Rehabilitation: Patient will continue with comprehensive therapies including physical therapy, occupational therapy, speech & language pathology specialized rehab nursing, neuropsychology and physiatry oversight.    Goals: household mobility, at ambulation level, Modified independent, Progressing  Recommended therapy after discharge: Home with Intermittent Supervision, Home Health Setting, versus, Outpatient Therapy Setting  Recommended equipment: Patient owns necessary equipment  Tentative discharge date: 02/22/19    Daily Functional Update:  Transfers        Transfer: Assistive Device: Roller Walker (02/11/2019 10:00 AM)    Transfer: Sit to stand assist level: Contact guard assistance (02/11/2019 10:00 AM)    Transfer: Stand pivot assist level: Contact guard assistance (02/11/2019 10:00 AM)    No data recorded   Gait/ Mobility        Gait: Assistive Device: Nurse, adult;Wheelchair Follow (02/11/2019 10:00 AM) GAIT: Assist Level: Minimum assistance (02/11/2019 10:00 AM)    Gait Distance: 180 feet (02/11/2019 10:00 AM)    No data recorded  No data recorded   Toileting        Toileting Assist: Moderate Assist (02/09/2019  7:15 AM)    Toileting Equipment: Grab bar - left (02/05/2019  1:40 PM)    Toilet Transfer Assist: Minimum assistance (02/09/2019  7:15 AM)     Dressing LE Dressing Assist: Stand By Assist (02/10/2019  7:15 AM)    UE Dressing Assist: Stand By Assist (02/10/2019  7:15 AM)       ???  MEDICAL PROBLEMS AND CO-MORBIDITIES:   ???    NEURO  Left pontine stroke (CVA), 12/18/2018  Encephalopathy s/p L pontine CVA  Mild Right Hemiparesis especially with fatigue  Cognitive Impairment               > seen at [facility] [place]               > treated and sent to rehab, dc from rehab and had multiple falls               > worsening dysarthria, RUE weakness/involuntary movement noted on 01/31/2019                            > transferred to [place] [facility]                            >  Routine EEG shows mild encephalopathy, no epileptiform activity                            > neurology ddx includes TIA vs neurodegenerative condition  - MRI brain:???with Signal abnormality without atrophy involving the left aspect of the ???pons and left middle cerebellar peduncle corresponding with his recent pontine infarct.lack of significant atrophy somewhat goes against brainstem/cerebellar neurodegenerative diseases.                            > per neuro: significant ischemic white matter disease although underlying MSA or other Parkinsonism cannot be ruled out at this time.                            > f/u OP with [facility] neurology/requested                             > continue ASA, Plavix, Statin                            > consult OT, PT, ST  ???  CARDIO:  HLD, HTN  Orthostatit Hypotension               > TTE normal EF, moderate LA dilation, no thrombus/shunt.               > continue  recc Abd Binder and Ted Hose > Goal long term BP from stroke perspective would be a little higher ideally around 110-140/70-90               > continue Midodrine, increased dose on 7/29 due to orthostatic hypotension o/n               > dc PTA flomax and Acei               > event monitor on d/c for further stroke risk factor assessment                > f/u cardio upon DC  ???  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  ENDO:  T2DM               > PTA Levemir 10 unites QHS and metformin 500 mg BID               > hold metformin, decrease lantus to 8 units QHS 7/30 due to tight fasting glucose control, LDCF    DERM:  Bilateral LEs (posterior thighs) petechial skin rash  > a/w thrombocytopenia in the setting of HCC  > Ddx TTP  versus contact dermatitis vs liver disease?  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 Skin rash posterior thighs as above.   Bowel: continent of bowel  Bladder: incontinent of bladder at night due to urgency, re-check PVRs  Nutrition: Current diet:  diabetic  Mental Health: consult neuropsychology to provide support / counseling  DVT Prophylaxis: heparin (porcine) PF syringe 5,000 Units  Q8H     Code: Full code  DC Plans: 8/11        Changes in Care:         02/11/2019 Medically stable. Did better with only 1 urinary incontinence event overnight with the new plan of limited fluid intake and schedule bathroom breaks prior to bedtime. No other changes. Continue with therapies      Chart reviewed, nursing notes and therapy notes reviewed.   Case discussed with the attending physician.     Gareth Morgan, M.D.   Physical Medicine and Rehabilitation (PM&R)   Resident Physician, PGY-2   Pager: [phone]   Amie Critchley Me: Jenetta Loges, Shaquera Ansley        Subjective:         The patient was seen and examined earlier in the morning.  He was working with speech therapy, and per report he did well.  Speech therapy do believe that the patient is improving, he does not report feeling as foggy as he was before. Yesterday we did increase midodrine to 2.5 mg 3 times daily and decrease his Lantus to 8 units at bedtime.  He is doing well. His purpuric rash on the lower extremities are pretty much resolved. He is tolerating PO intake well. Denies n/v. No chest pain, SHOB, nausea, vomit. LBM 7/31.         Objective Data:                              Vital Signs: Last Filed                 Vital Signs: 24 Hour Range   BP: 109/79 (07/31 1213)  Temp: 36.8 ???C (98.2 ???F) (07/31 0411)  Pulse: 80 (07/31 0847)  Respirations: 18 PER MINUTE (07/31 0411)  SpO2: 94 % (07/31 0411) BP: (109-129)/(65-79)   Temp:  [36.7 ???C (98 ???F)-36.8 ???C (98.2 ???F)]   Pulse:  [75-88]   Respirations:  [18 PER MINUTE]   SpO2:  [94 %-99 %]    Intensity Pain Scale (Self Report): 5 (02/11/19 1000) Vitals:    02/04/19 1500 02/07/19 0443 02/09/19 1500   Weight: 71.5 kg (157 lb 10.1 oz) 72.5 kg (159 lb 13.3 oz) 72.5 kg (159 lb 13.3 oz)       Intake/Output Summary:  (Last 24 hours)    Intake/Output Summary (Last 24 hours) at 02/11/2019 1311  Last data filed at 02/11/2019 1000  Gross per 24 hour   Intake 360 ml   Output 1050 ml   Net -690 ml      Stool Occurrence: 1       Oral Diet Order: Diabetic 2100-2400 Kcal/day (75 g carb/meal, 30 g carb/HS snack)  Last Bowel Movement Date: 02/10/19  Radiology  No new images.    Medications  Scheduled Meds:aspirin chewable tablet 81 mg, 81 mg, Oral, QDAY  atorvastatin (LIPITOR) tablet 80 mg, 80 mg, Oral, QHS  bimatoprost (LUMIGAN) 0.03 % (+) ophthalmic solution 1 drop (Patien'ts Own Med), 1 drop, Both Eyes, QDAY(21)  citalopram (CeleXA) tablet 20 mg, 20 mg, Oral, QDAY  clopiDOGrel (PLAVIX) tablet 75 mg, 75 mg, Oral, QDAY  docusate (COLACE) capsule 100 mg, 100 mg, Oral, BID  heparin (porcine) PF syringe 5,000 Units, 5,000 Units, Subcutaneous, Q8H  insulin aspart U-100 (NOVOLOG FLEXPEN) injection PEN 0-6 Units, 0-6 Units, Subcutaneous, ACHS (22)  insulin glargine (LANTUS SOLOSTAR) injection PEN 8 Units, 8 Units, Subcutaneous, QHS  lactulose oral solution 20 g, 30 mL, Oral, TID  melatonin tablet 3 mg, 3 mg, Oral, QHS  midodrine (PROAMATINE) tablet 2.5 mg, 2.5 mg, Oral, TID (02-23-16)  pantoprazole DR (PROTONIX) tablet 20 mg, 20 mg, Oral, QDAY(21)  senna (SENOKOT) tablet 2 tablet, 2 tablet, Oral, QHS  vitamin A & D topical ointment, , Topical, BID    Continuous Infusions:  PRN and Respiratory Meds:acetaminophen Q4H PRN, aluminum/magnesium hydroxide Q4H PRN, milk of magnesia (CONC) Q4H PRN, ondansetron Q6H PRN     Labs  Results for orders placed or performed during the hospital encounter of 02/04/19 (from the past 24 hour(s))   URINALYSIS DIPSTICK REFLEX TO CULTURE    Collection Time: 02/10/19  5:00 PM   # # Low-High    Color,UA AMBER     Turbidity,UA CLEAR CLEAR-CLEAR    Specific Gravity-Urine 1.028 1.003 - 1.035    pH,UA 5.0 5.0 - 8.0    Protein,UA 2+ (A) NEG-NEG    Glucose,UA NEG NEG-NEG    Ketones,UA NEG NEG-NEG    Bilirubin,UA NEG NEG-NEG    Blood,UA 1+ (A) NEG-NEG    Urobilinogen,UA INCREASED (A) NORM-NORMAL    Nitrite,UA NEG NEG-NEG    Leukocytes,UA NEG NEG-NEG    Urine Ascorbic Acid, UA NEG NEG-NEG   URINALYSIS MICROSCOPIC REFLEX TO CULTURE    Collection Time: 02/10/19  5:00 PM   # # Low-High WBCs,UA 2-10 0 - 2 /HPF    RBCs,UA 2-10 0 - 3 /HPF    Comment,UA       Criteria for reflex to culture are WBC>10, Positive Nitrite, and/or >=+1   leukocytes. If quantity is not sufficient, an addendum will follow.      MucousUA TRACE     Squamous Epithelial Cells 0-2 0 - 5   UA REFLEX CULTURE LABEL    Collection Time: 02/10/19  5:00 PM   # # Low-High    UA Reflex Culture       Criteria for reflex to culture are WBC>10, Positive Nitrite, and/or >=+1   leukocytes. If quantity is not sufficient, an addendum will follow.     POC GLUCOSE    Collection Time: 02/10/19  5:00 PM   # # Low-High    Glucose, POC 193 (H) 70 - 100 MG/DL   POC GLUCOSE    Collection Time: 02/10/19  9:56 PM   # # Low-High    Glucose, POC 160 (H) 70 - 100 MG/DL   POC GLUCOSE    Collection Time: 02/11/19  6:41 AM   # # Low-High    Glucose, POC 126 (H) 70 - 100 MG/DL   CBC CELLULAR THERAPEUTICS    Collection Time: 02/11/19  8:37 AM   # # Margarito Liner    White Blood Cells 5.7 4.5 - 11.0 K/UL  RBC 3.87 (L) 4.4 - 5.5 M/UL    Hemoglobin 11.9 (L) 13.5 - 16.5 GM/DL    Hematocrit 16.1 (L) 40 - 50 %    MCV 88.8 80 - 100 FL    MCH 30.8 26 - 34 PG    MCHC 34.6 32.0 - 36.0 G/DL    RDW 09.6 (H) 11 - 15 %    Platelet Count 124 (L) 150 - 400 K/UL    MPV 7.9 7 - 11 FL   BASIC METABOLIC PANEL CELLULAR THERAPEUTICS    Collection Time: 02/11/19  8:37 AM   # # Low-High    Sodium 137 137 - 147 MMOL/L    Potassium 3.8 3.5 - 5.1 MMOL/L    Chloride 103 98 - 110 MMOL/L    CO2 27 21 - 30 MMOL/L    Anion Gap 7 3 - 12    Glucose 143 (H) 70 - 100 MG/DL    Blood Urea Nitrogen 15 7 - 25 MG/DL    Creatinine 0.45 0.4 - 1.24 MG/DL    Calcium 8.6 8.5 - 40.9 MG/DL    eGFR Non African American >60 >60 mL/min    eGFR African American >60 >60 mL/min   POC GLUCOSE    Collection Time: 02/11/19 11:53 AM   # # Low-High    Glucose, POC 160 (H) 70 - 100 MG/DL           Physical Exam:         VS: BP 109/79 (BP Source: Arm, Right Upper)  - Pulse 80  - Temp 36.8 ???C (98.2 ???F)  - Ht 160.2 cm (63.07)  - Wt 72.5 kg (159 lb 13.3 oz)  - SpO2 94%  - BMI 28.25 kg/m???      Seated VS: 142/80  Standing VS: 102/67    Gen: No acute distress, seated in chair, WORKING WITH slp  HEENT: Normocephalic, atraumatic, hearing and vision intact  Neck: Supple  Chest: No labored breathing observed.   Abdomen: Not distended.  Binder in place  Ext: No pedal edema. Ted hose in place.  Skin: Improving LE purpura rash, nearly gone  Psych: mood and affect congruent.   MSK: Purposeful movement of all extremities, good IR/ER UEs, able to perform HF, ADF, PFL against resistance   Neuro: Clear speech, A&Ox3, stable.       Chart reviewed, nursing notes and therapy notes reviewed.     Case discussed with the attending physician.       Gareth Morgan, M.D.   Physical Medicine and Rehabilitation (PM&R)   Resident Physician, PGY-2   Pager: [phone]

## 2019-02-11 NOTE — Progress Notes
SPEECH-LANGUAGE PATHOLOGY     02/11/19 0900   Memory Goals   Therapy Activities Delayed recall   Delayed Recall Minimal prompting - patient able to 75-90% of the time  (Safety sequences, diabetic management)   Therapy Activity Comments Patient appropriatedly verbalized safety sequences sit/stand, stand/sit and use of his walker. Good recall of carb counting however he did not know his parameters for units of insulin, patient also was not familliar with his heart monitor.  Patient reported that his wife will manage his heart monitor, he didn't need to know.  Functional recall for ADL's improving, suspect slowing returning to his baseline.   Patient verbalized that he want to return to driving.  Discussed immediate concerns.  Patient verbalized agreement however he eventually was to drive again.    Patient Will Use a Strategy to Recall Verbally Presented Information with Progressing   Patient Will Use Appropriate Memory Strategies to Recall Recent Events, Express Needs, and Maintain Safety for Daily Living with Progressing

## 2019-02-11 NOTE — Progress Notes
SPEECH-LANGUAGE PATHOLOGY     02/11/19 1300   Problem Solving Goals   Therapy Activities Problem solving/reasoning   Problem Solving/Reasoning Visual;Low level;Minimal prompting - patient able to 75-90% of the time   Therapy Activity Comments Card game "Blink". good retention of the rules from a couple of days ago.  processing speed was fair.  Suspect patient's cognition is getting close to his baseline.  Patient completed word search puzzles.  Good memory for therapy activities and therapy goals.   Patient Will Provide Appropriate Solutions to Problems of Daily Living with Progressing   Patient Will Be Able to Problem Solve Safety and Awareness for Daily Living Skills with Progressing    Arna Snipe.A.L/CCC/SLP

## 2019-02-12 LAB — POC GLUCOSE
Lab: 148 mg/dL — ABNORMAL HIGH (ref 70–100)
Lab: 81 mg/dL (ref 70–100)
Lab: 224 mg/dL — ABNORMAL HIGH (ref 70–100)
Lab: 235 mg/dL — ABNORMAL HIGH (ref 70–100)

## 2019-02-12 NOTE — Progress Notes
Physical Medicine & Rehabilitation Progress Note     Date of Service: 02/12/2019  Length of Stay: 8 days (admitted 02/04/2019)    ASSESSMENT & PLAN:     Mitchell Lucero is a 68 y.o. male who is currently admitted to acute inpatient rehabilitation with the following problems:    Principal Problem:    Left pontine stroke (HCC)  Active Problems:    Orthostatic hypotension    HTN (hypertension)    Tremors of nervous system    T2DM (type 2 diabetes mellitus) (HCC)    History of neck surgery    Gait abnormality    Right hemiparesis (HCC)    Cirrhosis (HCC)      Orthostasis: re-time am midodrine dose before breakfast  -Otherwise, Continue current medications as displayed below.  -Lab and vitals reviewed as displayed below.  -Current functional status reviewed as displayed below.  -Patient remains medically and functionally stable for continued participation in the rehabilitation program including PT, OT, ST.  -DVT ppx: continue heparin (porcine) PF syringe 5,000 Units  Q8H      ATTESTATION    I personally observed the resident, Dr. Ursula Beath, performing the E/M, discussed case with resident, and concur with the documentation of history, physical assessment and treatment plan in this note.    Staff name:  Su Monks, MD Date:  02/12/2019       SUBJECTIVE:     No acute events overnight.  This morning he did have a slight hypotensive episode with nursing when going to the bathroom.  Rash improved.  Denies abdominal pain.      OBJECTIVE:     Vitals:    02/11/19 1213 02/11/19 1544 02/11/19 1703 02/12/19 0328   BP: 109/79 125/81 (!) 138/94 114/66   BP Source: Arm, Right Upper Arm, Right Upper Arm, Right Upper Arm, Right Upper   Pulse:  82 88 83   Temp:  36.8 ???C (98.2 ???F)  36.7 ???C (98 ???F)   SpO2:  95% 97% 95%   Weight:       Height:         Vitals:    02/04/19 1500 02/07/19 0443 02/09/19 1500   Weight: 71.5 kg (157 lb 10.1 oz) 72.5 kg (159 lb 13.3 oz) 72.5 kg (159 lb 13.3 oz)       GEN: alert seen in wheelchair CV: limbs warm and well perfused  RESP: respirations easy and regular on room air   ABD: non-distended  EXT/MSK: no rash, no edema in BLE  NEURO:   Demonstrates at least anti-gravity strength, speech fluent    DATA REVIEWED:   Last Bowel Movement Date: 02/12/19 (02/12/2019 10:00 AM)    Post-Void(PVR) Bladder Scan (mL): 55 milliliters (02/12/2019  3:26 AM)  Three BVIs below 125 mL    Straight Cath (mL): 100 (02/11/2019  1:45 PM)      Results for orders placed or performed during the hospital encounter of 02/04/19 (from the past 24 hour(s))   POC GLUCOSE    Collection Time: 02/11/19 11:53 AM   # # Low-High    Glucose, POC 160 (H) 70 - 100 MG/DL   POC GLUCOSE    Collection Time: 02/11/19  5:09 PM   # # Low-High    Glucose, POC 168 (H) 70 - 100 MG/DL   POC GLUCOSE    Collection Time: 02/11/19  9:37 PM   # # Low-High    Glucose, POC 148 (H) 70 - 100 MG/DL   POC GLUCOSE  Collection Time: 02/12/19  6:46 AM   # # Low-High    Glucose, POC 81 70 - 100 MG/DL       Daily Functional Update:  Transfers        Transfer: Assistive Device: Roller Walker (02/11/2019 10:00 AM)    Transfer: Sit to stand assist level: Contact guard assistance (02/11/2019 10:00 AM)    Transfer: Stand pivot assist level: Contact guard assistance (02/11/2019 10:00 AM)    No data recorded   Gait/ Mobility        Gait: Assistive Device: Nurse, adult;Wheelchair Follow (02/11/2019 10:00 AM)    GAIT: Assist Level: Minimum assistance (02/11/2019 10:00 AM)    Gait Distance: 180 feet (02/11/2019 10:00 AM)    No data recorded  No data recorded   Toileting        Toileting Assist: Moderate Assist (02/09/2019  7:15 AM)    Toileting Equipment: Grab bar - left (02/05/2019  1:40 PM)    Toilet Transfer Assist: Minimum assistance (02/09/2019  7:15 AM)     Dressing LE Dressing Assist: Stand By Assist (02/10/2019  7:15 AM)    UE Dressing Assist: Stand By Assist (02/10/2019  7:15 AM)       Scheduled Meds:aspirin chewable tablet 81 mg, 81 mg, Oral, QDAY atorvastatin (LIPITOR) tablet 80 mg, 80 mg, Oral, QHS  bimatoprost (LUMIGAN) 0.03 % (+) ophthalmic solution 1 drop (Patien'ts Own Med), 1 drop, Both Eyes, QDAY(21)  citalopram (CeleXA) tablet 20 mg, 20 mg, Oral, QDAY  clopiDOGrel (PLAVIX) tablet 75 mg, 75 mg, Oral, QDAY  docusate (COLACE) capsule 100 mg, 100 mg, Oral, BID  heparin (porcine) PF syringe 5,000 Units, 5,000 Units, Subcutaneous, Q8H  insulin aspart U-100 (NOVOLOG FLEXPEN) injection PEN 0-6 Units, 0-6 Units, Subcutaneous, ACHS (22)  insulin glargine (LANTUS SOLOSTAR) injection PEN 8 Units, 8 Units, Subcutaneous, QHS  lactulose oral solution 20 g, 30 mL, Oral, TID  melatonin tablet 3 mg, 3 mg, Oral, QHS  midodrine (PROAMATINE) tablet 2.5 mg, 2.5 mg, Oral, TID (02-23-16)  pantoprazole DR (PROTONIX) tablet 20 mg, 20 mg, Oral, QDAY(21)  senna (SENOKOT) tablet 2 tablet, 2 tablet, Oral, QHS  vitamin A & D topical ointment, , Topical, BID    Continuous Infusions:  PRN and Respiratory Meds:acetaminophen Q4H PRN, aluminum/magnesium hydroxide Q4H PRN, milk of magnesia (CONC) Q4H PRN, ondansetron Q6H PRN

## 2019-02-12 NOTE — Progress Notes
Pt had 3 post void urine residuals of less than 125 overnite.  Pt was inc of BM after breakfast.  Stool was tan and lose.  Laxatives held.  Pt had episode of Rt side numbness and weakness when getting off of the commode.  Pt assist to the w/c and then was fine.  Midodrine was given after this episode.  May need to schedule it before breakfast.

## 2019-02-13 LAB — POC GLUCOSE
Lab: 148 mg/dL — ABNORMAL HIGH (ref 70–100)
Lab: 89 mg/dL (ref 70–100)
Lab: 169 mg/dL — ABNORMAL HIGH (ref 70–100)
Lab: 165 mg/dL — ABNORMAL HIGH (ref 70–100)

## 2019-02-13 NOTE — Progress Notes
Physical Medicine & Rehabilitation Progress Note     Date of Service: 02/13/2019  Length of Stay: 9 days (admitted 02/04/2019)    ASSESSMENT & PLAN:     Mitchell Lucero is a 68 y.o. male who is currently admitted to acute inpatient rehabilitation with the following problems:    Principal Problem:    Left pontine stroke (HCC)  Active Problems:    Orthostatic hypotension    HTN (hypertension)    Tremors of nervous system    T2DM (type 2 diabetes mellitus) (HCC)    History of neck surgery    Gait abnormality    Right hemiparesis (HCC)    Cirrhosis (HCC)    -Continue current medications as displayed below.  -Lab and vitals reviewed as displayed below.  -Current functional status reviewed as displayed below.  -Patient remains medically and functionally stable for continued participation in the rehabilitation program including PT, OT, ST.  -DVT ppx: continue heparin (porcine) PF syringe 5,000 Units  Q8H      ATTESTATION    I personally observed the resident, Dr. Ursula Beath, performing the E/M, discussed case with resident, and concur with the documentation of history, physical assessment and treatment plan in this note.    Staff name:  Su Monks, MD Date:  02/13/2019       SUBJECTIVE:     No acute events overnight. Wife at the bedside. Patient reports less orthostasis now that midodrine has been moved to 0600.  Rash is resolved.  Denies pain.       OBJECTIVE:     Vitals:    02/12/19 0328 02/12/19 1612 02/13/19 0516 02/13/19 0655   BP: 114/66 (!) 144/87 (!) 149/81    BP Source: Arm, Right Upper Arm, Right Upper Arm, Right Upper    Pulse: 83 112 80    Temp: 36.7 ???C (98 ???F) 36.6 ???C (97.8 ???F) 36.7 ???C (98 ???F)    SpO2: 95% 99% 94%    Weight:    71.9 kg (158 lb 9.6 oz)   Height:         Vitals:    02/07/19 0443 02/09/19 1500 02/13/19 0655   Weight: 72.5 kg (159 lb 13.3 oz) 72.5 kg (159 lb 13.3 oz) 71.9 kg (158 lb 9.6 oz)       GEN: alert seen in bedside chair  CV: limbs warm and well perfused RESP: respirations easy and regular on room air   ABD: non-distended, non-tender, abdominal binder in place  EXT/MSK: no rash, no edema in BLE  NEURO:   Speech fluent, using bilateral upper limbs to complete word searches today      DATA REVIEWED:   Last Bowel Movement Date: 02/12/19 (02/12/2019  9:00 PM)    Post-Void(PVR) Bladder Scan (mL): 55 milliliters (02/12/2019  3:26 AM)  Three BVIs below 125 mL    Straight Cath (mL): 100 (02/11/2019  1:45 PM)      Results for orders placed or performed during the hospital encounter of 02/04/19 (from the past 24 hour(s))   POC GLUCOSE    Collection Time: 02/12/19 12:01 PM   # # Low-High    Glucose, POC 224 (H) 70 - 100 MG/DL   POC GLUCOSE    Collection Time: 02/12/19  4:58 PM   # # Low-High    Glucose, POC 235 (H) 70 - 100 MG/DL   POC GLUCOSE    Collection Time: 02/12/19 10:12 PM   # # Low-High    Glucose, POC 148 (H)  70 - 100 MG/DL   POC GLUCOSE    Collection Time: 02/13/19  7:08 AM   # # Low-High    Glucose, POC 89 70 - 100 MG/DL       Daily Functional Update:  Transfers        Transfer: Assistive Device: Roller Walker (02/11/2019 10:00 AM)    Transfer: Sit to stand assist level: Contact guard assistance (02/11/2019 10:00 AM)    Transfer: Stand pivot assist level: Contact guard assistance (02/11/2019 10:00 AM)    No data recorded   Gait/ Mobility        Gait: Assistive Device: Nurse, adult;Wheelchair Follow (02/11/2019 10:00 AM)    GAIT: Assist Level: Minimum assistance (02/11/2019 10:00 AM)    Gait Distance: 180 feet (02/11/2019 10:00 AM)    No data recorded  No data recorded   Toileting        Toileting Assist: Moderate Assist (02/09/2019  7:15 AM)    Toileting Equipment: Grab bar - left (02/05/2019  1:40 PM)    Toilet Transfer Assist: Minimum assistance (02/09/2019  7:15 AM)     Dressing LE Dressing Assist: Stand By Assist (02/10/2019  7:15 AM)    UE Dressing Assist: Stand By Assist (02/10/2019  7:15 AM)       Scheduled Meds:aspirin chewable tablet 81 mg, 81 mg, Oral, QDAY atorvastatin (LIPITOR) tablet 80 mg, 80 mg, Oral, QHS  bimatoprost (LUMIGAN) 0.03 % (+) ophthalmic solution 1 drop (Patien'ts Own Med), 1 drop, Both Eyes, QDAY(21)  citalopram (CeleXA) tablet 20 mg, 20 mg, Oral, QDAY  clopiDOGrel (PLAVIX) tablet 75 mg, 75 mg, Oral, QDAY  docusate (COLACE) capsule 100 mg, 100 mg, Oral, BID  heparin (porcine) PF syringe 5,000 Units, 5,000 Units, Subcutaneous, Q8H  insulin aspart U-100 (NOVOLOG FLEXPEN) injection PEN 0-6 Units, 0-6 Units, Subcutaneous, ACHS (22)  insulin glargine (LANTUS SOLOSTAR) injection PEN 8 Units, 8 Units, Subcutaneous, QHS  lactulose oral solution 20 g, 30 mL, Oral, TID  melatonin tablet 3 mg, 3 mg, Oral, QHS  midodrine (PROAMATINE) tablet 2.5 mg, 2.5 mg, Oral, TID (02-23-16)  pantoprazole DR (PROTONIX) tablet 20 mg, 20 mg, Oral, QDAY(21)  senna (SENOKOT) tablet 2 tablet, 2 tablet, Oral, QHS  vitamin A & D topical ointment, , Topical, BID    Continuous Infusions:  PRN and Respiratory Meds:acetaminophen Q4H PRN, aluminum/magnesium hydroxide Q4H PRN, milk of magnesia (CONC) Q4H PRN, ondansetron Q6H PRN

## 2019-02-13 NOTE — Progress Notes
Activity Therapy   Length of session: 20 minutes    Subjective     Pt was agreeable to participating in this session. Pt presented with flat affect. "Things have been good, but no so much today. I don't have anything to do." Pt's wife was present.    Objective    Session to support pt with engagement in preferred hobby of being outdoors with family. Pt and wife were assisted in making plans to go off unit for fresh air.    Assessment  Pt engaged and appreciative of the session and getting to go outside.     Plan  Therapist to continue providing 2 AT sessions/week during pt's stay to support his interests.        Pauline Aus, Activity Therapist, MA MT-BC  Board Certified - Music Therapist  [employer] @ [employer]

## 2019-02-14 ENCOUNTER — Encounter: Admit: 2019-02-14 | Discharge: 2019-02-14

## 2019-02-14 DIAGNOSIS — R160 Hepatomegaly, not elsewhere classified: Secondary | ICD-10-CM

## 2019-02-14 LAB — BASIC METABOLIC PANEL CELLULAR THERAPEUTICS: Lab: 139 MMOL/L — ABNORMAL LOW (ref 137–147)

## 2019-02-14 LAB — CBC CELLULAR THERAPEUTICS: Lab: 5.5 K/UL — ABNORMAL LOW (ref 4.5–11.0)

## 2019-02-14 LAB — POC GLUCOSE
Lab: 157 mg/dL — ABNORMAL HIGH (ref 70–100)
Lab: 108 mg/dL — ABNORMAL HIGH (ref 70–100)
Lab: 203 mg/dL — ABNORMAL HIGH (ref 70–100)

## 2019-02-14 MED ORDER — MIDODRINE 5 MG PO TAB
2.5 mg | Freq: Three times a day (TID) | ORAL | 0 refills | Status: DC
Start: 2019-02-14 — End: 2019-02-21
  Administered 2019-02-14 – 2019-02-21 (×21): 2.5 mg via ORAL

## 2019-02-14 MED ORDER — POLYETHYLENE GLYCOL 3350 17 GRAM PO PWPK
1 | Freq: Every day | ORAL | 0 refills | Status: DC
Start: 2019-02-14 — End: 2019-02-21
  Administered 2019-02-14 – 2019-02-21 (×8): 17 g via ORAL

## 2019-02-14 NOTE — Telephone Encounter
Referral received from Dr. Jeannine Kitten for a liver mass biopsy and TACE/MWA. Patient was discussed at tumor board on 02/08/19, last bilirubin was 2.0 on 02/03/19. Patient is currently admitted to [facility] facility, scheduled to be discharged on 02/22/19. Spoke with the patient's social worker in rehab, [place], and Dr. Silvestre Gunner nurse navigator, [place]. Plan is for patient to d/c from rehab and come for PAT appointment at 12:40 on 8/11. Consult with Dr. Earvin Hansen scheduled for 2:15, new patient appointment with Dr. Arma Heading moved to 3:20 slot. Patient's wife is agreeable to everything mentioned above, appointment information sent per request. She will contact me with any questions or concerns.     Dorris Carnes, RN BSN

## 2019-02-14 NOTE — Progress Notes
OCCUPATIONAL THERAPY     02/14/19 0715   Subjective   Subjective I'm moving very slow this morning.   Grooming   Grooming - Wash Both Hands Assist No   Grooming Assist Minimal Assist   Grooming Position Standing for ___ % of task  (100)   Grooming Comments CGA in standing for safety   Upper Body Dressing   UE Dressing Assist Stand By Assist   Upper Dressing Position Sitting in chair   Upper Dressing Garments/Equipment Abdominal binder   Upper Dressing Comments Doffing/donning tee shirt with setup.    Lower Body Dressing   LE Dressing Assist Moderate Assist   Lower Dressing Position Sitting in chair   Lower Dressing Garments/Equipment Compression stockings   Lower Dressing Comments Extra time required for donning pants and briefs this AM.  Requires several attempts with threading LE's through pantlegs for briefs and pants prior to succeeding.  Stands with CGA to pull up over hips.  Dependent for socks and shoes due to inability to reach feet.    Toileting   Toileting-Adjusting clothing BEFORE using toiet, commode, bedpan or urinal Assist No   Toileting-Wipe Self Assist No   Toileting-Adjust clothing AFTER using toilet, commode, bedpan or urinal Assist No   Toileting Assist Stand By Assist   Toileting Position Seated   Toileting Comments CGA in standing for pulling briefs up/down over hips.     Research scientist (life sciences) bar - left   Transfers   Transfer Comment Ambulating short distances in room with steadying assist.  Reporting feeling lightheaded when walking into bathroom prior to donning compression.  Once abdominal binder and thigh high TED hose were applied, patient did not have additional symptoms.     Assessment   Assessment Patient demonstrating increased difficulty with ADL performance this session.  Demonstrating orthostatic hypotension prior to wearing compression. Plan   Plan Comments Continue current plan of care.      Henrene Dodge, OTR/l

## 2019-02-14 NOTE — Progress Notes
SPEECH-LANGUAGE PATHOLOGY     02/14/19 0900   Memory Goals   Therapy Activities Immediate recall;Delayed recall   Immediate Recall Minimal prompting - patient able to 75-90% of the time  (Word list: 80% accy. Story recall: 70% accy)   Delayed Recall Minimal prompting - patient able to 75-90% of the time   Therapy Activity Comments Worked on immediate/delayed recall without strategies.  noted distractibility.  Education regarding memory function. Emphasized the use of associations and verbal repetition   Patient Will Use a Strategy to Recall Verbally Presented Information with Progressing   Patient Will Use Appropriate Memory Strategies to Recall Recent Events, Express Needs, and Maintain Safety for Daily Living with Progressing   Attention Goals   Therapy Activities Sustained attention   Sustained Attention 21-30 minutes   Therapy Activity Comments patient distractible during memory task.  Discussed the link between memory and attention and provided a strategy to consider during his therapy.   Patient Will Complete Sustained Attention Task with Progressing   Patient Will Demonstrate Attention Skills to Improve Safety with Daily Activities and Communication with [place] M.A.L/CCC/SLP

## 2019-02-14 NOTE — Progress Notes
SPEECH-LANGUAGE PATHOLOGY     02/14/19 1130   Memory Goals   Therapy Activities Delayed recall;Education;Visualization;Association   Visualization Minimal prompting - patient able to 50-74% of the time   Association Total assistance - patient able to < 25% of the time   Education ongoing education regarding memory function. Provided examples using various strategies.   Delayed Recall Minimal prompting - patient able to 50-74% of the time   Therapy Activity Comments The utilization of strategies to improve delayed recall was difficult.  Moderate difficulty with cues required. Provided examples of ADL's and how to use simple strategies.  Patient may benefit most from simple visual reminders and routines.   Patient Will Use a Strategy to Recall Verbally Presented Information with Progressing   Patient Will Use Appropriate Memory Strategies to Recall Recent Events, Express Needs, and Maintain Safety for Daily Living with [place] M.A.L/CCC/SLP

## 2019-02-14 NOTE — Progress Notes
OCCUPATIONAL THERAPY     02/14/19 1140   Cognitive   Patient Behavior Calm;Cooperative   Cognition Follows Commands   Subjective   Subjective Patient was agreeable to participate in OT session.     Lower Body Dressing   LE Dressing Assist Stand By Assist   Lower Dressing Position Sitting in chair   Lower Dressing Comments Doffing/donning shoes and socks (not compression) while seated in recliner.  Requires extra time and explores different positions with LE's, but eventually is able to doff/don B shoes without assist.  No adaptive equipment was needed.    Assessment   Assessment Progressing in ADL performance, demonstrating ability to doff/don socks and shoes with SBA only.     Plan   Plan Comments Continue to progress ADL's, balance for standing ADL's, functional cognition, visual scanning and functional use of BUE's.    Recommendations   Expected Discharge Date 02/22/19   Weekly Goals   Patient Will Perform Bathing In Chair;w/ Moderate Assist   Patient Will Perform UE Dressing Met;In Chair;w/ Stand By Assist   Patient Will Perform LE Dressing Met;In Chair;w/ Minimum Assist   Patient Will Perform Grooming Met;Standing at Sink;w/ Minimum Assist   Patient Will Perform Toileting w/ Minimum Assist   Pt Will Transfer To Shower w/ Minimum Assist   Pt Will Transfer To Toilet Met;w/ Minimum Assist   Goals for the stay   Pt will perform basic care and transfer with Modified independence;Progressing  (least restrictive device, assistance with IADLs)     Molli Knock, OTR/L

## 2019-02-15 ENCOUNTER — Encounter: Admit: 2019-02-15 | Discharge: 2019-02-15

## 2019-02-15 LAB — POC GLUCOSE
Lab: 182 mg/dL — ABNORMAL HIGH (ref 70–100)
Lab: 84 mg/dL (ref 70–100)
Lab: 242 mg/dL — ABNORMAL HIGH (ref 70–100)
Lab: 202 mg/dL — ABNORMAL HIGH (ref 70–100)

## 2019-02-15 NOTE — Progress Notes
OCCUPATIONAL THERAPY     02/15/19 1000   Cognitive   Patient Behavior Calm;Cooperative   Cognition Follows Commands   Subjective   Subjective Patient reporting fatigue from PT and requested to ride in [facility] to and from therapy gym.     Transfers   Transfer: Electrical engineer: Sit to stand assist level Stand by assistance   Transfer: Sit to stand type of assist For safety   Transfer: Stand to sit assist level Stand by assistance   Transfer: Stand to sit type of assist For safety   Transfer: Stand pivot assist level Stand by assistance   Transfer: Stand pivot type of assist For safety   Activity   Therapeutic Activities BITS training in standing position, performing 7 one minute sets for visual scanning. Using roller walker and with SBA  on user paced/full screen scoring 21 hits initially and gradually increasing to 29 hits.  Also addressing fine motor coordination while in seated position.  Fine motor exercise consisted of fastening/unfastening nuts and bolts requiring bilateral UE integration.  Requires extra time for fastening, however, did not drop objects.    Assessment   Assessment Pt is demonstrating improvement with visual scanning and also fine motor control.     Plan   Plan Comments Continue to progress ADL's, balance for standing ADL's, functional cognition, visual scanning and functional use of BUE's.      Molli Knock, OTR/L

## 2019-02-15 NOTE — Progress Notes
OCCUPATIONAL THERAPY     02/15/19 0730   Subjective   Subjective Patient was agreeable to participate in OT session.    Upper Body Dressing   Upper Dressing Garments/Equipment Abdominal binder   Upper Dressing Comments Setup   Lower Body Dressing   LE Dressing Assist Stand By Assist   Lower Dressing Position Sitting edge of bed   Lower Dressing Garments/Equipment Compression stockings   Lower Dressing Comments OT donned thigh high TED hose while patient was supine.  Patient able to don shorts and socks while seated with setup and stands with SBA for pulling shorts over hips.     Bed Mobility   Bed Mobility: Supine to sit assist level Stand by assistance   Bed Mobility: Supine to sit type of assist With rail;With head of bed flat   Bed Mobility: Sit to Supine Assist Level   (Has bed rail at home)   Transfers   Transfer: Programmer, applications   Transfer: Sit to stand assist level Stand by assistance   Transfer: Sit to stand type of assist For safety   Transfer: Stand to sit assist level Stand by assistance   Transfer: Stand to sit type of assist For safety   Transfer: Stand pivot assist level Stand by assistance   Transfer: Stand pivot type of assist For safety   Activity   Therapeutic Activities Ambulating in hallway with roller walker and SBA for aproximately 300 feet prior to eating breakfast.    Assessment   Assessment Patient able to perform LB dressing and functional mobility this AM without signs of orthostatic hypotension.  Compression was donned at bed level at beginning of session.    Plan   Plan Comments Continue to progress ADL's, balance for standing ADL's, functional cognition, visual scanning and functional use of BUE's.      Molli Knock, OTR/L

## 2019-02-15 NOTE — Progress Notes
SPEECH-LANGUAGE PATHOLOGY     02/15/19 0900   Memory Goals   Therapy Activities Immediate recall;Delayed recall   Association Verbal messages;Minimal prompting - patient able to 50-74% of the time   Immediate Recall Minimal prompting - patient able to 50-74% of the time   Delayed Recall Minimal prompting - patient able to 50-74% of the time   Therapy Activity Comments Practiced immediate/delayed recall of short messages using family information.  Immediate recall was fair plus recalled 4/5 units of information. Delayed 10 min recalled 3/5 units of information w/ min cues.  Worked with patient on repetition and association strategies.  Suspect patient will need visual reminders in his environment and practice to refer to his visual reminders.     Patient Will Use a Strategy to Recall Verbally Presented Information with Progressing   Patient Will Use Appropriate Memory Strategies to Recall Recent Events, Express Needs, and Maintain Safety for Daily Living with Progressing   Attention Goals   Therapy Activity Comments Patient asked to have his blinds let down to decrease distractions.  Patient now aware of how easily he can be distracted, improve insight and awareness of how distractibility can impact memory.  Good sustained attention during therapy today.   Patient Will Complete Sustained Attention Task with Progressing   Patient Will Demonstrate Attention Skills to Improve Safety with Daily Activities and Communication with [place] M.A.L/CCC/SLP

## 2019-02-15 NOTE — Progress Notes
PHYSICAL THERAPY     02/15/19 1300   Subjective   Subjective This boot feels better on my foot when talking about posterior AFO vs anterior shell. No other complaints   Transfers   Transfer: Product/process development scientist: Sit to stand assist level Stand by assistance   Transfer: Sit to stand type of assist For safety   Transfer: Stand to sit assist level Stand by assistance   Transfer: Stand to sit type of assist For safety   Transfer: Stand pivot assist level Stand by assistance   Transfer: Stand pivot type of assist For safety   Transfer Comment Verbal cues required for safety to have patient push up from chair vs both hands on RW   Gait   Gait Distance 225 feet  (+100' x2 bouts)   GAIT: Assist Level Stand by assistance   GAIT: Type of Assist For safety   Gait: Assistive Device AFO: Ankle Foot Orthosis;Roller Walker   Gait: Patterns Decreased gait velocity;Trunk lean forward   Gait: Deviation Left Decreased stride length   Gait: Deviation Right Decreased dorsiflexion/toe drag in swingphase;Decreased stride length;Decreased knee flexion in swing;Decreased hip flexion   Activity Limited By Complaint of fatigue   Gait Comment Trialed both anterior shell and posterior leaf AFO. Patient reports improved comfort with posterior leaf AFO. Knee hyperextension appears improved with both anterior and posterior support however with fatigue at end of session noted more hyperextenion. [employer] rep plans to come back to AM PT session on 8/5 to discuss with assigned PT   Stairs   Stair: Assist Level Minimum assistance   Stairs: Number Climbed 4   Stairs Management Technique Reciprocal   Stairs: Assistive Device Two Rails   Activity   PT Therapeutic Activities Gait activities to trial AFO's for knee control    Assessment   Assessment AFO appeared to help control knee hyperextension. Patient reports improved comfort with posterior leaf vs anterior shell and [employer] rep to be present for AM PT session on 8/5 for continued trials with regualr therapist.    Plan   Comments HIGT program, proximal strengthening, stairs, dynamic stepping practice, AFO trial.      Alden Benjamin, PT, DPT

## 2019-02-15 NOTE — Progress Notes
Had pt give his lunchtime insulin.  Pt was familiar with Novolog pen.  Gave pt a home type of needle.  Pt was unable to get paper cover off and used his teeth to remove it.  Pt was not aware of priming the needle or using alcohol to clean his skin before injecting.  Pt was able to set the pen and inject, but was unsteady.

## 2019-02-15 NOTE — Progress Notes
PHYSICAL THERAPY     02/15/19 0900   Vitals*   Activity At Rest   Pulse 97   BP Patient Position Chair   BP (!) 147/84   $$ O2 Delivery RA   SpO2 98 %   Subjective   Subjective Pt presents up in the bedside chair. He tells me he needs a while to get going in the morning & feels slower in the morning.    Transfers   Transfer: Product/process development scientist: Sit to stand assist level Stand by assistance   Transfer: Sit to stand type of assist For safety   Transfer: Stand to sit assist level Stand by assistance   Transfer: Stand to sit type of assist For safety   Transfer: Stand pivot assist level Stand by assistance   Transfer: Stand pivot type of assist For safety   Gait   Gait Distance 15 feet  (+ 180 ft)   GAIT: Assist Level Stand by assistance   GAIT: Type of Assist For safety   Gait: Hospital doctor;Wheelchair Follow   Gait: Patterns Decreased gait velocity;Trunk lean forward   Gait: Deviation Right No heel strike/foot flat;Knee hyperextension/genu recurvatum;Decreased hip flexion;Decreased hip extension   Activity Limited By Complaint of dizziness   Stairs   Stair: Assist Level Minimum assistance   Stairs: Number Climbed 12   Stairs Management Technique Non-reciprocal   Stairs: Assistive Device Two Rails   Stairs Activity Limited By Patient choice   Stairs Comment Pt performed up/down once, then performed skipping a step to work on leg strengthening.    Activity   PT Therapeutic Activities Vitals assessed in sittting & standing due to pt c/o dizziness with gait in room. SBP dropped from 147 to 114.    High Intensity Gait Training Yes   Target Heart Rate 113  (113-137)   Age Predicted Max Heart Rate 161   Minutes Spent in Target Zone 19 mins  (+ 6 in moderate zone)   Overground Activity Passive vector navigating 2, 4 and 6 curb steps with hand held assist; performed anterior & retrowalking with hand held assist. Pt was noted to be anteropulsive & retropulsive with gait. Stairs See above stair activity as part of HIGT.    Other Activity Orthotist was present to observe gait & talk about recommended AFO.    Assessment   Assessment Patient was orthostatic but able to household distances of gait before onset of dizziness. Decreased foot clearance was noted on the stair activities today with pt needing verbal cues for attention to foot placement.    Plan   Comments HIGT program, proximal strengthening, stairs, dynamic stepping practice, AFO trial.    Recommendations   PT Discharge Recommendations Home with Assistance;Home Health Setting  [facility] is family preference. )   Equipment Recommendations   (AFO)   Expected Discharge Date 02/22/19     Doretha Sou, PT, DPT

## 2019-02-15 NOTE — Progress Notes
CLINICAL NUTRITION                                                        Clinical Nutrition Follow-Up Assessment     Name: Mitchell Lucero        MRN: [id number]          DOB: April 14, 1951          Age: 68 y.o.  Admission Date: 02/04/2019             LOS: 11 days        Recommendation:  Continue diet as ordered    Comments:  Pt is a 68 y.o. male with PMH of HTN, DM2, C6-C7 ADCF in 1997, and recent left pontine stroke in 12/18/2018 who was transferred to [facility] for a transient episode of dysarthria for 1.5 hrs in the morning of 7/20 associated with R arm large amplitude irregular, involuntary movement and both leg shaking with retained awareness. Increasing number of falls since discharge from IPR. Unintentional wt loss of 30# x1 year. Clinical Nutrition following for poor appetite. During this admission pt was also dx with [facility], likely secondary to NASH. Hepatology was following and oncology consulted. IR consulted for liver Biopsy. AFP WNL. Pt was taking metformin PTA, but no longer taking per previous notes. Pt and his wife have previously received diabetic diet education. Pt???s wife managing meals and DM meds since his stroke. Pt continues eating 75-100% of all meals this admit. He has no follow-up questions at this time. RD will review diet information prior to discharge.                            Nutrition Assessment of Patient:  Admit Weight: 70.3 kg([facility] admission weight); Weight Change Since Admit: +1.3kg  BMI (Calculated): 28.25; BMI Categories Adult: Over Weight: 25-29.9  Pertinent Allergies/Intolerances: none  Pertinent Labs: FSBS 96-225; Pertinent Meds: Reviewed; Unintentional Weight Loss: 20% in 1 year (significant)  Current Oral Intake: Adequate  Estimated Calorie Needs: 1590-1911(25-28 kcal/kg DBW)  Estimated Protein Needs: 76-82(1.2-1.3 g/kg DBW)    Malnutrition Assessment:  Does not meet criteria     Nutrition Focused Physical Assessment:  Loss of Subcutaneous Fat: No;  ; Muscle Wasting: Yes; Severity: Mild; Location: Deltoid, Interosseous, Temple  Edema: No;  ;    Pressure Injury: none noted    Nutrition Diagnosis:  Inadequate oral intake  Etiology: recent poor appetite; slowly improving  Signs & Symptoms: pt and wife reports     Intervention / Plan:  Montior weight, labs, meds, Gi symptoms, PO intake    Goals:  Patient to consume >75% of meals/supplements  Time Frame: Throughout stay  Status: Partially met;Ongoing  Aid in stabilizing blood glucose  Time Frame: Throughout stay  Status: Partially met;Ongoing  Verbalize understanding of diet  Time Frame: Prior to discharge  Status: Ongoing       Francee Nodal, MS, RD, LD   [place]te: [phone]  Office Phone: [phone]

## 2019-02-15 NOTE — Rehab Team Conference
Team Conference Note     Date of Admission:  02/04/2019  Date of Team Conference:  02/15/2019   Mitchell Lucero is a 68 y.o. male.     DOB: 04/03/1951                  MRN#:  [id number]    Team Conference   Attendees: Odie Sera MD, Attending Physician; Vinicius Jenetta Loges MD, Resident Physician; George Ina MD, Resident Physician; Hilarie Fredrickson SW, Social Worker; Randel Pigg RN, Nurse Case Manager; Michaelyn Barter RN, Rehabilitation Program Coordinator; Henrene Dodge OTR, Occupational Therapy; Athena Masse DPT, Physical Therapy; Herbie Baltimore SLP, Speech Therapy; Juanetta Snow PharmD, Pharmacy; Inocencio Homes RD LD, Clinical Nutrition; Chaya Jan, Activity Therapist; Sallye Lat RN, Nursing    Medical Update: Medically stable. Some episodes of hypotension, managing with midodrine. Trying to find the right timing prior to therapies. Continue abdominal binder and ted hose.  Ordered R AFO. Continue to monitor PO/fluid intake after 1800 to limit urinary accidents.   F/u appt 8/11 with Hepatology    NeuroPsych: Poor judgment. Wants to drive at discharge.     Team Goal:  Patient will perform: household mobility, at ambulation level, Modified independent, Progressing   Pt will perform basic care and transfer with: Modified independence, Progressing(least restrictive device, assistance with IADLs)     Discharge Planning     Discharge Date:  02/22/19    Wife [name] able to provide care to pt at home along with hh services, as wife reports she cannot transfer him to outpt therapy. Wife is aware and is on top of pt taking his medications at home including insulin. Dc on 8/11 to PAC at 12:40am.  ??? Living Arrangements  Type of Residence: Home, with Home Health or other assistance  Living Arrangements: Spouse/significant other, Family members(Pt lives with his wife [name] and mom Psychologist, clinical)  Financial risk analyst / Tub: Psychologist, counselling  How many levels in the residence?: 2 Can patient live on one level if needed?: Yes  Does residence have entry and/or side stairs?: No(No entry steps through the garage, stair glide to main level)  Assistance needed prior to admit or anticipated on discharge: Yes  Who provides assistance or could if needed?: Pt's wife [name] and [facility]   Are they in good health?: Yes  Can support system provide 24/7 care if needed?: No   ???  Pt lives with his wife [name] and mother [name] in a split level home with no entry steps through the garage. There is a stair lift from the lower to the upper level, with a landing where pt has to transfer from one lift to the other. Once upstairs, pt is able to stay on that level. Pt's bathroom is not accessible by walker, but is small enough for him to wall surf, with a standard toilet, and walk in shower with shower chair and grab bars.   Pt reports being somewhat independent with ADL's, with the use of his roller walker prior to admission. Pt was fully independent with ADL's before his initial stroke in June 2020. Before this admission, pt was mobilizing with his roller walker, but had frequent falls, which he and [name] feel is related to his blood pressure. Pt was able to managing his own self care tasks. [name] was assisting with IADL's, including medication management. Pt does not drive.   [name] is pt's main support and she works full time, but her job is about 5 minutes from the house and she  can get to pt quickly. Pt spends his days with his mom [name], who is disabled, but able to care for herself. Pt's adult children, [name] and [name], are involved and supportive, but work full time. [name] lives next door to pt. Pt owns a roller walker, shower chair, single point cane, and stair glide.   ???  ??? Level of Function   Prior level of function: Needs assist with ADLs  Which ADLs require assistance?: Mobility, medication management, housekeeping, meal prep, transportation.   Who assists with ADLs?: Pt's wife [name] and [facility] ??? Coverage  Primary Insurance: Medicare Replacement  Additional Coverage: RX    ??? Source of Income   Source Of Income: Other retirement income  ??? PCP  Wilford Grist, [phone], [phone]   Pt reports seeing his PCP about a month prior to admission.   ??? Pharmacy  [id number] - [place], [place] - [address]  ???  ??? Curator at home: [employer], [employer], Single [employer], Other (comment)(Stair glide)  ??? Home Health  Agency name: [facility]    Plan Home with assistance; outpatient PT    DME R anterior shell AFO;  Wall mounted grab bars in shower    Rehabilitation Plan     Progress  Berg balance score improved from 15/56 to 24/56  TUG improved by 10 seconds  SBA for sit to stand with walker  Mild improvement with memory for recent events.  Improved processing speed, less frequent repetition of task instructions.  Improved verbal problem solving for safety with ADL's.    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 Per wife report suspect patient had some baseline deficits with short term memory. Mrs. Neidert managed their finances.  Recommendations:  Oversight of medication management  Ongoing speech services  At this time suggest consistent supervision  managing orthostasis, TEDS thigh high and ABD binder    Plan  HIGT program, proximal strengthening, stairs, dynamic stepping practice, AFO trial.   Continue to progress ADL's, balance for standing ADL's, functional cognition, visual scanning and functional use of BUE's.    no c/o pain, posterior ankle stg 2 healing w/ A&D, fall bundle in place, pt calls appropriately, TEDS and ABD binder when out of bed  Ambulate to bathroom for toileting minimal assist with RW (with compression on)  Walk 1/2 lap with roller walker CGA & assist of staff in the evening (pull wheelchair behind in case of orthostasis)  Educate wife on donning patient's compression (ted hose and abdominal binder)   Simulate home insulin and medication set-up    Goals    Weekly Goals  Weekly Bed Mobility Goals: Patient will perform sit to supine with, Patient will perform supine to sit with  Patient will perform sit to supine with: Modified independent, Progressing  Patient will perform supine to sit with: Modified independent, Progressing  Weekly Transfer Goals: Patient will complete sit to stand transfer with, Patient will complete stand to sit transfer with, Patient will complete stand pivot transfer with  Patient will complete sit to stand transfer with: Stand by assistance, Achieved(new goal: modified indep)  Patient will complete stand to sit transfer with: Stand by assistance, Achieved(new goal: modified indep)  Patient will complete stand pivot transfer with: Stand by assistance, Progressing  Weekly Ambulation/Stairs Goals: Patient will ascend/descend  Patient will ambulate: 150 feet, Minimum assistance, Achieved(new goal: modified indep)  Patient will ascend/descend: 12 stairs, 1 rail, Stand by assistance(new goal)  Weekly Goals  ADL Goals: Bathing, Dressing UE, Dressing LE, Grooming, Toileting  Patient Will Perform Bathing: In Chair, w/ Moderate Assist  Patient Will Perform UE Dressing: Met, In Chair, w/ Stand By Assist  Patient Will Perform LE Dressing: Met, In Chair, w/ Minimum Assist  Patient Will Perform Grooming: Met, Standing at Sink, w/ Minimum Assist  Patient Will Perform Toileting: w/ Minimum Assist  Function Transfer Goals: Shower, Charity fundraiser: w/ Minimum Assist  Pt Will Transfer To Toilet: Met, w/ Minimum Assist     Functional Independence Measures   Eating FIM: 5 - Set up with containers  Grooming FIM: 5 - Verbal cues or coaxing  Bathing FIM: 1 - Total assistance, patient performs less than 25% of grooming/bathing/dressing/toileting tasks  Dressing - Upper Body FIM: 3 - Moderate assistance, patient performs 50-74% or more of grooming/bathing/dressing/toileting tasks  Dressing - Lower Body FIM: 1 - Total assistance, patient performs less than 25% of grooming/bathing/dressing/toileting tasks  Toileting FIM: 3 - Moderate assistance, patient performs 50-74% or more of grooming/bathing/dressing/toileting tasks  Bladder FIM: 6 - Modified independence - Safety  Bowel FIM: 6 - No bowel movement, medication  Transfers FIM: 4 - Minimal assistance, patient performs 75% or more of transferring tasks  Toilet Transfers FIM: 3 - Moderate assistance, patient performs 50-74% of transferring tasks (lifting required)  Shower Transfers FIM: 3 - Moderate assistance, patient performs 50-74% of transferring tasks (lifting required)  Gait: 1 - Total assistance, patient expends less than 25% effort (comment % of effort), <50 feet   Comprehension FIM: 5 - Standby prompting. Understands abstract and/or basic information greater than 90% of the time, prompting less than 10%  Expression FIM: 5 - Standy prompting - Able to express abstract and/or basic information >90% of the time, prompting <10%  Social Interaction FIM: 6 - Modified independence - Able to interact in most situations and no prompting required  Problem Solving FIM: 4 - Minimal prompting - Able to solve routine problems 75-90%  Memory FIM: 4 - Minimal prompting - Able to recognize people frequently encountered, remembers daily routines, responds to requests of others 50-74% of the time, needs promting less than half of the time

## 2019-02-16 LAB — POC GLUCOSE
Lab: 162 mg/dL — ABNORMAL HIGH (ref 70–100)
Lab: 88 mg/dL (ref 70–100)
Lab: 94 mg/dL (ref 70–100)
Lab: 206 mg/dL — ABNORMAL HIGH (ref 70–100)
Lab: 222 mg/dL — ABNORMAL HIGH (ref 70–100)

## 2019-02-16 LAB — CBC CELLULAR THERAPEUTICS: Lab: 5.9 K/UL — ABNORMAL LOW (ref >60)

## 2019-02-16 LAB — BASIC METABOLIC PANEL CELLULAR THERAPEUTICS: Lab: 137 MMOL/L — ABNORMAL LOW (ref 137–147)

## 2019-02-16 NOTE — Progress Notes
SPEECH-LANGUAGE PATHOLOGY     02/16/19 0900   Problem Solving Goals   Therapy Activities Problem solving/reasoning   Problem Solving/Reasoning Low level;Minimal prompting - patient able to 75-90% of the time   Therapy Activity Comments Situational problem solving for safety when he goes home. topics; managing his BP and blood sugars.  Discussed symtoms of BP changes and what to do.  Patient reported he was not good and managing his diabetes on his own.  This service will work with his wife to set up simple written instructions that he can follow.  Patient reported that simple routines are helpful.  An alarm and middle of the day med's can be left on the table to remind the patient to take them at lunch.  Mitchell Lucero was open to suggestions.  He identified memory strategies that would be helpful to him when his wife is at work; noon med's placed on table and a clock nearby to help him keep track of the time when med's were taken.    Patient Will Provide Appropriate Solutions to Problems of Daily Living with Progressing   Patient Will Be Able to Problem Solve Safety and Awareness for Daily Living Skills with Progressing

## 2019-02-16 NOTE — Progress Notes
PHYSICAL THERAPY     02/16/19 0915   Pain   Pain Scale No Pain   Subjective   Subjective Patient presents in the bedside chair ready for therapy. He denies dizziness today.    Transfers   Transfer: Electrical engineer: Sit to stand assist level Stand by assistance   Transfer: Sit to stand type of assist For safety   Transfer: Stand to sit assist level Stand by assistance   Transfer: Stand to sit type of assist For safety   Transfer: Stand pivot assist level Stand by assistance   Transfer: Stand pivot type of assist For safety   Gait   Gait Distance 200 feet  (x2 + 250 ft x2 + 150 ft)   GAIT: Assist Level Stand by assistance   GAIT: Type of Assist For safety   Gait: Assistive Device AFO: Ankle Foot Orthosis;Roller Gilford Rile;Wheelchair Follow   Gait: Patterns Decreased gait velocity;Trunk lean forward   Gait: Deviation Left Decreased stride length   Gait: Deviation Right Knee hyperextension/genu recurvatum;No heel strike/foot flat   Gait Comment Orthotist was present for AFO trials; pt trialed a carbon fiber AFO with & without heel lift, and an anterior shell AFO with and without heel lift. Pt was also observed walking without an AFO for comparison.    Assessment   Assessment Patient was noted to have more fluid gait & more efficient RLE limb clearance while wearing an AFO. However, in any scenario, he still demonstrates R knee hyperextension in stance. Anticipate pt could benefit from hamstring strengthening especially for eccentric control.    Plan   Comments HIGT program, proximal strengthening, stairs, dynamic stepping practice, AFO trial.    Recommendations   PT Discharge Recommendations Home with Assistance;Home Health Setting   Equipment Recommendations   (AFO-possibly)   Expected Discharge Date 02/22/19     Doren Custard, PT, DPT

## 2019-02-16 NOTE — Progress Notes
SPEECH-LANGUAGE PATHOLOGY     02/16/19 1400   Problem Solving Goals   Therapy Activities Problem solving/reasoning   Problem Solving/Reasoning Visual;Low level;Minimal prompting - patient able to 75-90% of the time   Math Skills Low level;Minimal prompting - patient able to 75-90% of the time   Therapy Activity Comments Mitchell Lucero per patient request. His wife brought in the game for him to try with therapy.  Worked on having the patient pick up the dice for practice using his hands, patient attempted to write in his scores.  Mitchell Lucero also did some simple adding of the die. While slow he was able to add up 5 die. Problem solving for what to save/roll for required mod assist.  Patient reported it had been awhile since he played the game.     Patient Will Provide Appropriate Solutions to Problems of Daily Living with Progressing   Patient Will Be Able to Problem Solve Safety and Awareness for Daily Living Skills with Progressing    Arna Snipe.A.L/CCC/SLP

## 2019-02-16 NOTE — Progress Notes
PHYSICAL THERAPY     02/16/19 1430   Intensity Pain   Words to Describe Pain Burning;Sore   Location of Pain Leg   Location Pain Orientation Right;Upper;Inner   Duration of Pain Constant   Pain Intervention(s) Other (Comment)  (repositioned thigh hi ted hose, notified RN & ordered XS)   Subjective   Subjective The patient requested to use the toilet upon PT arrival.    Transfers   Transfer: Assistive Device [employer]   Transfer: Sit to stand assist level Stand by assistance   Transfer: Sit to stand type of assist For safety   Transfer: Stand to sit assist level Stand by assistance   Transfer: Stand to sit type of assist For safety   Transfer: Stand pivot assist level Stand by assistance   Transfer: Stand pivot type of assist For safety;Increased time to complete   Toileting   Toileting-Adjusting clothing BEFORE using toiet, commode, bedpan or urinal Assist No   Toileting-Wipe Self Assist No   Toileting-Adjust clothing AFTER using toilet, commode, bedpan or urinal Assist No   Toileting Assist Stand By Assist   Toileting Position Seated   Toileting Transfer   Toilet Transfer Technique Posterior transfer onto receptacle   Toilet Transfer Assist Contact guard assistance   Toilet Transfer Equipment Grab bar - left   Daily Care   Patient continent of bladder? Yes, void   Urine Occurrence 1   Urine Output Source Void   Urine Color Yellow   Gait   Gait Distance 170 feet  (+ 20 ft)   GAIT: Assist Level Stand by assistance   GAIT: Type of Assist For safety   Gait: Hospital doctor;Wheelchair Follow   Gait: Patterns Decreased gait velocity   Gait: Deviation Right Knee hyperextension/genu recurvatum;Decreased stride length;Decreased dorsiflexion/toe drag in swingphase   Activity Limited By Complaint of fatigue   Activity   PT Therapeutic Activities Patient performed side stepping up & down a ramp with BUE support on railing with 3# RLE weight multiple bouts. He also performed anterior & retrowalking up/down a ramp with 1 UE assist & minimal assist with the 3# RLE weight.    Assessment   Assessment The patient demonstrates RLE glute weakness with retrowalking and side stepping. He will benefit from ongoing glute/hamstring strengthening to reduce the R knee hyperextension moment. During gait, the patient c/o R inner thigh pain & it was discovered that the thigh hi's had rolled down & were causing a pressure injury. A smaller size was ordered, and this was discussed with nursing. The ted hose were also repositioned for comfort.    Plan   Comments HIGT program, proximal strengthening, stairs, dynamic stepping practice, AFO trial.      Doretha Sou, PT, DPT

## 2019-02-16 NOTE — Telephone Encounter
Contacted the patient's wife [name] to follow up with her after she spoke to [employer] in [place] on 02/14/19. Dicsussed Teresa's questions as they related to the patient's appt with Dr. Arma Heading. Instructed her on where a WC can be obtained for his appointment. Questions answered. She was provided this RN's contact number. Mailed and Training and development officer. General emotional support provided. Stronly encouraged her to call with any concerns .Althia Forts, RN

## 2019-02-17 ENCOUNTER — Encounter: Admit: 2019-02-17 | Discharge: 2019-02-17

## 2019-02-17 LAB — POC GLUCOSE
Lab: 138 mg/dL — ABNORMAL HIGH (ref 70–100)
Lab: 90 mg/dL (ref 70–100)
Lab: 254 mg/dL — ABNORMAL HIGH (ref 70–100)
Lab: 248 mg/dL — ABNORMAL HIGH (ref 70–100)

## 2019-02-17 NOTE — Progress Notes
PHYSICAL THERAPY     02/17/19 1300   Subjective   Subjective Patient denies soreness from therapy & is ready to go.    Transfers   Transfer: Product/process development scientist: Sit to stand assist level Stand by assistance   Transfer: Sit to stand type of assist For safety   Transfer: Stand to sit assist level Stand by assistance   Transfer: Stand to sit type of assist For safety   Transfer: Stand pivot assist level Stand by assistance   Transfer: Stand pivot type of assist For safety;Increased time to complete   Toileting   Toileting Comments Pt needs steadying vs verbal cues as he takes both hands off the walker to perform clothing management. Pt begins to lean too far forward without walker support.    Toileting Ambulance person Grab bar - left   Daily Care   Patient continent of bowel? No  (brief)   Stool Occurrence 1   Gait   Gait Distance 200 feet  (+ 180 ft)   GAIT: Assist Level Stand by assistance;Minimum assistance;Moderate assistance   GAIT: Type of Assist For safety   Gait: Hospital doctor;Wheelchair Follow;AFO: Ankle Foot Orthosis  (x1 LOB)   Gait: Patterns Decreased gait velocity;Trunk lean forward;Loss of balance   Gait: Deviation Right No heel strike/foot flat;Knee hyperextension/genu recurvatum   Stairs   Stair: Assist Level Minimum assistance   Stairs: Number Climbed 12   Stairs Management Technique Non-reciprocal;Sideways   Stairs: Assistive Device One rail   Stairs Activity Limited By Patient choice   Stairs Comment Performed with 4# RLE weight. Pt needed verbal cues to sequence in order to not cross over his leg while turned sideways.    Activity   PT Neuromuscular Re-Education Standing on airex foam with 1 UE support and CGA to toss objects into a bucket for dynamic standing balance.    Assessment Assessment There is improved R knee control (less severe hyperextension) while the patient is wearing the carbon fiber AFO and small heel lift. The patient also notes that it is comfortable for him. He continues to need BUE support while walking; he had a LOB while trying to walk & adjust his mask requiring minimal to moderate steadying assist. We discussed stopping to adjust it rather than doing both at one time.    Plan   Comments HIGT program, proximal strengthening, stairs, dynamic stepping practice, AFO trial.      Doretha Sou, PT, DPT

## 2019-02-17 NOTE — Progress Notes
ATTESTATION    I personally performed the E/M including history, physical exam, and MDM.    Staff name:  Su Monks, MD Date:  02/17/2019   Contact me on Voalte      Rehabilitation Medicine Progress Note     Date of Service: 02/17/2019  Date of Admission: 02/04/2019  Length of Stay: 13 days    Principal Problem:    Left pontine stroke [facility])  Active Problems:    Orthostatic hypotension    HTN (hypertension)    Tremors of nervous system    T2DM (type 2 diabetes mellitus) (HCC)    History of neck surgery    Gait abnormality    Right hemiparesis (HCC)    Cirrhosis (HCC)      ASSESSMENT & PLAN     Mitchell Lucero is a 68 y.o. male who is currently admitted to inpatient rehabilitation for Left pontine stroke (HCC).    Rehabilitation Plan  Rehabilitation: Patient will continue with comprehensive therapies including physical therapy, occupational therapy, speech & language pathology, specialized rehab nursing, neuropsychology and physiatry oversight.    Goals: household mobility, at ambulation level, Modified independent, Progressing  Recommended therapy after discharge: Home with Assistance, Home Health Setting  Recommended equipment: (AFO-possibly)  Tentative discharge date: 02/22/19  Barriers to therapy/ discharge identified on 02/17/2019: orthostatic hypotension, urinary incontinence    Daily Functional Update:  Transfers        Transfer: Assistive Device: Nurse, adult (02/16/2019  2:30 PM)    Transfer: Sit to stand assist level: Stand by assistance (02/16/2019  2:30 PM)    Transfer: Stand pivot assist level: Stand by assistance (02/16/2019  2:30 PM)    No data recorded   Gait/ Mobility        Gait: Assistive Device: Nurse, adult;Wheelchair Follow (02/16/2019  2:30 PM)    GAIT: Assist Level: Stand by assistance (02/16/2019  2:30 PM)    Gait Distance: 170 feet (+ 20 ft) (02/16/2019  2:30 PM)    No data recorded  No data recorded   Toileting        Toileting Assist: Stand By Assist (02/16/2019  2:30 PM) Toileting Equipment: Grab bar - left (02/05/2019  1:40 PM)    Toilet Transfer Assist: Contact guard assistance (02/16/2019  2:30 PM)     Dressing LE Dressing Assist: Stand By Assist (02/15/2019  7:30 AM)    UE Dressing Assist: Stand By Assist (02/14/2019  7:15 AM)       Summary of Changes in Care 02/17/2019:  -no changes    Left pontine stroke (CVA), 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?????????????????????????????????????????>???Routine EEG shows mild encephalopathy, no epileptiform activity  ??????????????????????????????????????????????????????????????????????????????>???neurology ddx includes TIA vs neurodegenerative condition  - MRI brain:???with Signal abnormality without atrophy involving the left aspect of the ???pons and left middle cerebellar peduncle corresponding with his recent pontine infarct.lack of significant atrophy somewhat goes against brainstem/cerebellar neurodegenerative diseases.  ??????????????????????????????????????????????????????????????????????????????>???per neuro: significant ischemic white matter disease although underlying MSA or other Parkinsonism cannot be ruled out at this time.  ??????????????????????????????????????????????????????????????????????????????>???f/u OP with Mov Disorders neurology/requested   ??????????????????????????????????????????????????????????????????????????????>???continue ASA, Plavix, Statin  ??????????????????????????????????????????????????????????????????????????????>???consult OT, PT, ST  ???  CARDIO:  HLD, HTN  Orthostatic Hypotension  ???????????????????????????????????????>???TTE  normal EF, moderate LA dilation, no thrombus/shunt.  ???????????????????????????????????????>???continue ???recc Abd Binder and Ted Hose  ??????????????????????????????????????????????????????????????????????????????>???Goal long term BP from stroke perspective would be a little higher ideally around 110-140/70-90  ???????????????????????????????????????>???continue Midodrine, increased dose on 7/29 due to orthostatic hypotension o/n ???????????????????????????????????????>???dc PTA flomax and Acei  ???????????????????????????????????????>???event monitor on d/c for further stroke risk factor assessment   ???????????????????????????????????????>???f/u cardio upon DC  ???  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  ENDO:  T2DM  -PTA Levemir 10 unites QHS and metformin 500 mg BID  ???????????????????????????????????????>???hold metformin, decrease lantus to 8 units QHS 7/30 due to tight fasting glucose control, LDCF  ???  DERM:  Bilateral LEs (posterior thighs) petechial skin rash, resolved  > a/w thrombocytopenia in the setting of HCC  > Ddx TTP versus contact dermatitis vs liver disease?  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  Pain Management:???pain is well controlled, heat / ice PRN and Tylenol PRN  Bowel:???continent of bowel.  Goal 2-3 BMs per day, continue lactulose  Bladder:???incontinent of bladder at night due to urgency, see above  Nutrition:???Current diet: ???diabetic  Mental Health:???consult neuropsychology to provide support / counseling    DVT ppx: heparin (porcine) PF syringe 5,000 Units  Q8H  Code Status: Full Code    Current Medication List:  Scheduled Meds:aspirin chewable tablet 81 mg, 81 mg, Oral, QDAY  atorvastatin (LIPITOR) tablet 80 mg, 80 mg, Oral, QHS  bimatoprost (LUMIGAN) 0.03 % (+) ophthalmic solution 1 drop (Patien'ts Own Med), 1 drop, Both Eyes, QDAY(21)  citalopram (CeleXA) tablet 20 mg, 20 mg, Oral, QDAY clopiDOGrel (PLAVIX) tablet 75 mg, 75 mg, Oral, QDAY  docusate (COLACE) capsule 100 mg, 100 mg, Oral, BID  heparin (porcine) PF syringe 5,000 Units, 5,000 Units, Subcutaneous, Q8H  insulin aspart U-100 (NOVOLOG FLEXPEN) injection PEN 0-6 Units, 0-6 Units, Subcutaneous, ACHS (22)  insulin glargine (LANTUS SOLOSTAR) injection PEN 8 Units, 8 Units, Subcutaneous, QHS  lactulose oral solution 20 g, 30 mL, Oral, TID  melatonin tablet 3 mg, 3 mg, Oral, QHS  midodrine (PROAMATINE) tablet 2.5 mg, 2.5 mg, Oral, TID (02-23-16)  pantoprazole DR (PROTONIX) tablet 20 mg, 20 mg, Oral, QDAY(21)  polyethylene glycol 3350 (MIRALAX) packet 17 g, 1 packet, Oral, QDAY  senna (SENOKOT) tablet 2 tablet, 2 tablet, Oral, QHS  vitamin A & D topical ointment, , Topical, BID    Continuous Infusions:  PRN and Respiratory Meds:acetaminophen Q4H PRN, aluminum/magnesium hydroxide Q4H PRN, milk of magnesia (CONC) Q4H PRN, ondansetron Q6H PRN        SUBJECTIVE/ History:     States he is feeling well.  Denies any further lightheadedness/ dizziness episodes.  No concerns or complaints.    ROS: denies pain, fevers, chills, shortness of breath      OBJECTIVE/ Physical Exam:     Vitals:    02/16/19 0700 02/16/19 0955 02/16/19 1657 02/17/19 0517   BP: 134/70 132/73 128/85 132/65  BP Source:   Arm, Right Upper Arm, Right Upper   Pulse:  95 90 87   Temp:   36.6 ???C (97.9 ???F) 36.7 ???C (98 ???F)   SpO2:  99% 95% 98%   Weight:       Height:         Vitals:    02/09/19 1500 02/13/19 0655 02/14/19 0603   Weight: 72.5 kg (159 lb 13.3 oz) 71.9 kg (158 lb 9.6 oz) 71.9 kg (158 lb 9.6 oz)       GEN: alert and conversant, no acute distress, seen in room in bedside chair  HEAD: normocephalic  EYES: sclera anicteric, conjunctiva not injected  CV: limbs warm and well perfused  RESP: respirations easy and regular on room air  ABD: non-distended, non-tender, abdominal binder in place  EXT: no edema BLE, compression socks in place  NEURO: Seen ambulating with OT and walker with continued right genu recurvatum    RESULTS AND DATA REVIEWED:     Last Bowel Movement Date: 02/16/19 (02/16/2019  9:17 AM)      Results for orders placed or performed during the hospital encounter of 02/04/19 (from the past 24 hour(s))   POC GLUCOSE    Collection Time: 02/16/19  4:57 PM   # # Low-High    Glucose, POC 222 (H) 70 - 100 MG/DL   POC GLUCOSE    Collection Time: 02/16/19  9:25 PM   # # Low-High    Glucose, POC 138 (H) 70 - 100 MG/DL   POC GLUCOSE    Collection Time: 02/17/19  6:38 AM   # # Low-High    Glucose, POC 90 70 - 100 MG/DL   POC GLUCOSE    Collection Time: 02/17/19 11:51 AM   # # Low-High    Glucose, POC 254 (H) 70 - 100 MG/DL       *Chart reviewed including consultant recommendations and therapy notes.

## 2019-02-17 NOTE — Progress Notes
OCCUPATIONAL THERAPY     02/16/19 1030   Subjective   Subjective I'm doing pretty good today.   Grooming   Grooming - Wash Both Hands Assist No   Grooming Assist Stand By Assist   Grooming Position Standing for ___ % of task  (100)   Grooming Comments SBA for safety in standing.   Toileting   Toileting-Adjusting clothing BEFORE using toiet, commode, bedpan or urinal Assist No   Toileting-Wipe Self Assist No   Toileting-Adjust clothing AFTER using toilet, commode, bedpan or urinal Assist No   Toileting Assist Stand By Assist   Toileting Position Seated   Toileting Comments SBA when standing for clothing management.    Higher education careers adviser For safety;Stand by Diplomatic Services operational officer bar - left   Transfers   Transfer: Hospital doctor   Transfer: Sit to stand assist level Stand by assistance   Transfer: Sit to stand type of assist For safety   Transfer: Stand to sit assist level Stand by assistance   Transfer: Stand to sit type of assist For safety   Transfer: Stand pivot assist level Stand by assistance   Transfer: Stand pivot type of assist For safety   Activity   Therapeutic Activities Ambulating to/from therapy gym with roller walker and SBA to CGA .  Seated in chair for BUE strengthening/ROM exercises:  Balloon bat x 10 minutes with repeated overhead reaching with each UE and with occasional brief rest breaks.  Theraband exercises (red) for shoulder/elbow x 15 to 20 reps each.  Standing at [facility] for visual scanning speed with user paced/1 minute/full screen with scores ranging from 28 to 34 hits.     Assessment   Assessment Patient is progressing with ADL and functional mobility performance during ADL's to the SBA level.  Visual scanning speed on the BITS is slowly improving.     Plan   Plan Comments Continue to progress ADL's, balance for standing ADL's, functional cognition, visual scanning and functional use of BUE's.      Henrene Dodge, OTR/L

## 2019-02-17 NOTE — Progress Notes
OCCUPATIONAL THERAPY     02/17/19 0900   Cognitive   Orientation Oriented x4   Patient Behavior Calm;Cooperative   Cognition Follows Commands   Subjective   Subjective "I got lightheaded trying to get up off of the toilet today.  I needed help."  OT observed that patient was not wearing his compression.  Patient stated that the thigh high TED hose were too tight over his thigh yesterday and caused a red mark.  "They told me I didn't have to wear them anymore."  OT educated patient on need for compression.  OT donned knee high TED hose on patient and clarified with physician and she was in agreement.    Transfers   Transfer: Electrical engineer: Sit to stand assist level Stand by assistance   Transfer: Sit to stand type of assist For safety   Transfer: Stand to sit assist level Stand by assistance   Transfer: Stand to sit type of assist For safety   Transfer: Stand pivot assist level Stand by assistance   Transfer: Stand pivot type of assist For safety   Transfer Comment Ambulating to/from therapy gym with roller walker and SBA.    Instrumental Acitivities of Daily Living (IADL)   IADL Other (Comment)  (Kitchen mobility)   IADL Assist Level Stand by assist   IADL Cues Addressing ambulating in kitchen with roller walker and walker basket.  Practiced retrieving items from refrigerator and cabinets.  Placed item in microwave.  Requires occasional verbal cuing for safety, including positioning self in close proximity to work surface and avoiding over reaching.     Assessment   Assessment Patient performed kitchen mobility tasks with SBA.     Plan   Plan Comments Continue to progress ADL's, balance for standing ADL's, functional cognition, visual scanning and functional use of BUE's.      Molli Knock, OTR/L

## 2019-02-17 NOTE — Progress Notes
SPEECH-LANGUAGE PATHOLOGY     02/17/19 0900   Problem Solving Goals   Therapy Activities Problem solving/reasoning   Problem Solving/Reasoning Low level;Minimal prompting - patient able to 50-74% of the time  (Safety scenarios)   Therapy Activity Comments Reviewed memory strategies left for the patient yesterday. Discussed how to set up his environment at home with routines, calendars and visual reminders.  Patient relies heavily on his wife. Patient repeatedly stated my wife does that.  Emphasized the need for him to remember safety sequences, that is his responsibility, especially when she is at work.  Patient eventually acknowledged that it equates to max his independence (being left alone). He reported his wife observed a poor transfer into bed last evening. Mr. Trull was able to verbalize what he did that was not safe and state what he should have done. Reviewed safety zone for reaching, walking with his walker.  Patient verbalized increased insight/awareness of his need to max safety, after this hospitalization.  Patient would benefit from visual feedback and rationale for safety sequences, as well as practice.     Patient Will Provide Appropriate Solutions to Problems of Daily Living with Progressing   Patient Will Be Able to Problem Solve Safety and Awareness for Daily Living Skills with Progressing

## 2019-02-17 NOTE — Progress Notes
Activity Therapy   Length of session: 45 minutes    Subjective     Pt was agreeable to participating in this session. Pt presented with congruent affect.    Objective    Session to support pt with engagement in word search puzzles.    Assessment  Pt requiring extra time to find words, but maintains focus and is able to progress.    Plan  Therapist to continue support pt's in-room leisure needs and encourage pt use of puzzles and games once home.         Pauline Aus, Activity Therapist, MA MT-BC  Board Certified - Music Therapist  Voalte Me @ Pauline Aus

## 2019-02-17 NOTE — Progress Notes
PHYSICAL THERAPY     02/17/19 1100   Precautions   Precautions   (ted hose & binder for orthostasis)   Subjective   Subjective Patient reports improvement in right thigh abrasion due to switching to knee hi ted hose. He denies dizziness while wearing the knee hi hose.    Transfers   Transfer: Electrical engineer: Sit to stand assist level Stand by assistance   Transfer: Stand to sit assist level Stand by assistance   Transfer: Stand pivot assist level Stand by assistance   Transfer: Stand pivot type of assist For safety;Increased time to complete   Gait   Gait Distance 190 feet  (x2)   GAIT: Assist Level Stand by assistance   GAIT: Type of Assist For safety   Gait: Programmer, applications;Wheelchair Follow;AFO: Ankle Foot Orthosis   Gait: Patterns Decreased gait velocity;Trunk lean forward   Gait: Deviation Left Decreased hip flexion;Decreased hip extension;Decreased stride length   Gait: Deviation Right Decreased hip flexion;Knee hyperextension/genu recurvatum;No heel strike/foot flat   Activity   PT Therapeutic Activities Reverse step-ups onto a 6" step in the parallel bars x20, stepping forward/backward over a foam plank in the parallel bars while wearing a 3# leg weight on the R side.    Exercise Prone;RLE;LLE;Resisted ROM  (HS curls (3#- R, 4#-L), hip extension)   Exercise Repetitions 10  (x2 sets)   Other Activity Patient also performed x6 prone press ups for upper thoracic extension.    Assessment   Assessment Patient was noted to have tight hip flexors bilat in prone and glute weakness (exercises in reduced range). The focus of the session was glute strengthening through ther ex and functional activity.    Plan   Comments HIGT program, proximal strengthening, stairs, dynamic stepping practice, AFO trial.        Doren Custard, PT, DPT

## 2019-02-17 NOTE — Telephone Encounter
-----   Message from Marquita Palms, MD sent at 02/04/2019  2:06 PM CDT -----  Regarding: RE: Imaging Reviewed  Yes, we need to review him in [facility].  We do not want an independent biopsy at this time and that should be cancelled.      Given that we are going to do a MWA (which I thought would be a good option for him) we can do both procedures at the same time.     JO  ----- Message -----  From: Homero Fellers, RN  Sent: 02/04/2019  11:38 AM CDT  To: Marquita Palms, MD, Jerolyn Center, MSN,APRN, #  Subject: Imaging Reviewed                                 Hey Dr. Jeannine Kitten,  Dr. Theda Sers was reviewing imaging to approve requested liver biopsy on this patient. He did say that patient would be a candidate for TACE/MWA of the segment 7/8 lesion per imaging.  Patient is scheduled for the liver biopsy next week.  It looks from your note that you were going to discuss him at [facility] on Monday.  Thanks!

## 2019-02-18 LAB — POC GLUCOSE
Lab: 177 mg/dL — ABNORMAL HIGH (ref 70–100)
Lab: 108 mg/dL — ABNORMAL HIGH (ref 70–100)
Lab: 204 mg/dL — ABNORMAL HIGH (ref 70–100)

## 2019-02-18 LAB — CBC CELLULAR THERAPEUTICS: Lab: 6.2 K/UL — ABNORMAL HIGH (ref 4.5–11.0)

## 2019-02-18 LAB — BASIC METABOLIC PANEL CELLULAR THERAPEUTICS: Lab: 135 MMOL/L — ABNORMAL LOW (ref 137–147)

## 2019-02-18 NOTE — Progress Notes
SPEECH-LANGUAGE PATHOLOGY     02/18/19 1500   Problem Solving Goals   Therapy Activities Problem solving/reasoning   Problem Solving/Reasoning Visual;High level;Minimal prompting - patient able to 75-90% of the time   Therapy Activity Comments Played Yatzee.  Patient with improved problem solving and decision making.  Adding up points was difficult.  Much improved from the last time he played.  Patient reported this is a game he would like to play with his wife and grandchildren.    Situational problem solving for home. Patient with increased awareness and insight into his deficits and impact on safety if left alone.  Patient verbalized awareness that he may need some extra assist when he discharges next week.  This service will re-assess cognition on Monday. Family education next week prior to discharge.   Patient Will Provide Appropriate Solutions to Problems of Daily Living with Progressing   Patient Will Be Able to Problem Solve Safety and Awareness for Daily Living Skills with Progressing

## 2019-02-18 NOTE — Progress Notes
SPEECH-LANGUAGE PATHOLOGY     02/18/19 0900   Problem Solving Goals   Therapy Activities Problem solving/reasoning   Problem Solving/Reasoning Visual;High level;Minimal prompting - patient able to 50-74% of the time   Therapy Activity Comments Patient drove the session today by wanting to study the travel book his wife brought in.  Patient and his wife are eventually wanting to travel to [place].  Mr. Koudelka exhibited moderate difficulty locating information on charts and comprehending travel routes and the differences between travel packages.  Attempted to encourage the patient to utilize keys provided and to identify symbols.  When given additional information and this seemed to provide some assist. This may be baseline for patient.  Mr. Giannini verbalized "my wife makes all the arrangements. I just go. "   Patient Will Provide Appropriate Solutions to Problems of Daily Living with Progressing  (for ADL's.)   Patient Will Be Able to Problem Solve Safety and Awareness for Daily Living Skills with Progressing  (for ADL's.)    Arna Snipe.A.L/CCC/SLP

## 2019-02-18 NOTE — Progress Notes
PHYSICAL THERAPY     02/18/19 1330   Subjective   Subjective Pt is agreeable to therapy.    Transfers   Transfer: Electrical engineer: Sit to stand assist level Stand by assistance   Transfer: Sit to stand type of assist For safety   Transfer: Stand to sit assist level Stand by assistance   Transfer: Stand to sit type of assist For safety   Transfer: Stand pivot assist level Contact guard assistance   Transfer: Stand pivot type of assist For safety   Gait   Gait Comment Short distance in the room.    Activity   PT Neuromuscular Re-Education Patient performed tall kneeling at a chair on the mat for trunk extensor strengthening (reach up to grab an objec) and for glute strength. After a few minutes, pt began having R sided twitching, almost convulsions that he said he couldn't control. He was able to talk me, telling me this has happened at home before, and ever since his stroke. He was alerted & oriented. No new weakness was observed, and I got him from tall kneeling to sitting edge of mat. At that time, the shaking stopped. Pt never lost consiousness. His vitals were: 132/79, O2 93% on RA, and HR was 98.    Assessment   Assessment After the patient's R side episode, he stated he felt a little shaky transferring back to the wheelchair. Once back in his room, he was able to walk to the chair without any change in level of assistance. His nurse & Dr. Queen Blossom were both notified.    Plan   Comments HIGT program, proximal strengthening, stairs, dynamic stepping practice, AFO trial.      Doren Custard, PT, DPT

## 2019-02-18 NOTE — Progress Notes
PHYSICAL THERAPY     02/18/19 1015   Subjective   Subjective Patient feels like he is having an off day.    Transfers   Transfer: Electrical engineer: Sit to stand assist level Stand by assistance   Transfer: Sit to stand type of assist For safety   Transfer: Stand to sit assist level Stand by assistance   Transfer: Stand to sit type of assist For safety   Transfer: Stand pivot assist level Stand by assistance   Transfer: Stand pivot type of assist For safety   Gait   Gait Distance 190 feet   GAIT: Assist Level Stand by assistance   GAIT: Type of Assist For safety   Gait: Programmer, applications;Wheelchair Follow;AFO: Ankle Foot Orthosis   Gait: Patterns Decreased gait velocity;Trunk lean forward   Gait Comment Patient needed cues to stay near his walker. A theraband was tied across the lower rungs as a reminder of how close to be inside the walker.    Activity   High Intensity Gait Training Yes   Beta Blocker No   Target Heart Rate 113  (113-137)   Age Predicted Max Heart Rate 161   Minutes Spent in Target Zone 22 mins  (Moderate, <1 min in high intensity zone)   Treadmill Activity Static woodway support with BUE use x9 min at .5-.8 mph, at .5-1% incline. Retrowalking 3 min at .3-.4 mph, side stepping R at .2 mph for 2 min.    Assessment   Assessment Improved knee hyperextension was noted but pt has some trouble with stride length and toe clearance on the 1% incline. Decreased hip flexion was also noted.    Plan   Comments HIGT program, proximal strengthening, stairs, dynamic stepping practice, AFO trial.      Doren Custard, PT, DPT

## 2019-02-18 NOTE — Progress Notes
ORTHOTICS/PROSTHETICS  Consult Note:      NAME: Mitchell Lucero  ADMISSION DATE: admitted to hospital  DOB: September 23, 1950  AGE: 68 y.o.  ROOM: R2224/01  DOCTOR:     Date of Order: 8/3  Date of Service: 8/3    Services referred for: Orthotic Eval and Treat: AFO-carbon fiber    Description of condition/injury, including services:Neurological    Size: Medium Side RIght      Measurements: Area/circumference/Diameter/Length N/A    Functional Goals discussed for device use:  Joint stabilization (support and alignment)  Increase or decrease range of motion  Increase ADLs or IADLs    Device is to be ordered No    Estimated date of delivery Delivered carbon fiber AFO on 8/7    Functional goals met: Yes    The patient states satisfaction with the fit/function of device: Yes    Additional supplies provided to the patient: No    Patient Education:  Written and/or verbal instruction/information provided to Patient  Information provided: device function, usage/break-in period, safety issues, donning/doffing of device, how/whom to report problems related to device/change in physical condition, hospital staff provided instructions, care and cleaning, fitting issues, benefits and precautions and skin inspection      Patient did tolerate the procedure without incident/problem.    Follow-up scheduled: Delivered AFO to pt after working w/ PT on most appropriate intervention for pt.  Will f/u PRN    PLAN:  Order completed    Brittnay Burtnett  02/18/2019

## 2019-02-18 NOTE — Progress Notes
OCCUPATIONAL THERAPY     02/18/19 0715   Subjective   Subjective Patient was agreeable to participate in therapy session.     Upper Body Dressing   UE Dressing Assist Stand By Assist   Upper Dressing Position Sitting edge of bed   Upper Dressing Garments/Equipment Abdominal binder   Upper Dressing Comments Setup   Lower Body Dressing   LE Dressing Assist Stand By Assist   Lower Dressing Position Sitting edge of bed   Lower Dressing Garments/Equipment Compression stockings   Lower Dressing Comments Donning briefs, shorts, socks, shoes and R AFO:  Requires setup, supervision and for R AFO, also required verbal cuing and demonstration.  With extra time, patient was able to complete LB dressing without physical assist.    Transfers   Transfer: Assistive Device Brewing technologist   Transfer: Sit to stand assist level Stand by assistance   Transfer: Sit to stand type of assist For safety   Transfer: Stand to sit assist level Stand by assistance   Transfer: Stand to sit type of assist For safety   Transfer: Stand pivot assist level Stand by assistance   Transfer: Stand pivot type of assist For safety   Activity   Therapeutic Activities Upon completion of ADL's, patient ambulated 1/2 lap around unit with roller walker and SBA to CGA, requiring one seated rest break due to fatigue.    Assessment   Assessment Patient demonstrating the ability to consistently perform dressing tasks at the SBA level.     Plan   Plan Comments Continue to progress ADL's, balance for standing ADL's, functional cognition, visual scanning and functional use of BUE's. Family training on 02/21/19.    Recommendations   Expected Discharge Date 02/22/19     Molli Knock, OTR/L

## 2019-02-19 LAB — POC GLUCOSE
Lab: 176 mg/dL — ABNORMAL HIGH (ref 70–100)
Lab: 101 mg/dL — ABNORMAL HIGH (ref 70–100)
Lab: 193 mg/dL — ABNORMAL HIGH (ref 70–100)
Lab: 162 mg/dL — ABNORMAL HIGH (ref 70–100)

## 2019-02-19 NOTE — Progress Notes
PHYSICAL THERAPY  MOBILITY NOTE      Name: Mitchell Lucero        MRN: [id number]          DOB: 1950/09/20          Age: 68 y.o.  Admission Date: 02/04/2019             LOS: 15 days        Mobility   Patient was mobilized today with the assistance of the P.T. mobility aide as part of the ongoing physical therapy plan of care.    Rehab Technician: Skipper Cliche  Date: 02/19/2019

## 2019-02-19 NOTE — Progress Notes
Pt is still very shaky and needs frequent verbal cueing when trying to give insulin.  Pt had a rt side tremor spell in therapy today, and again when up in the bathroom.  Cardiac event monitor remains in place.

## 2019-02-19 NOTE — Progress Notes
Rehabilitation Medicine Progress Note     Date of Service: 02/19/2019  Date of Admission: 02/04/2019  Length of Stay: 15 days    Principal Problem:    Left pontine stroke [facility])  Active Problems:    Orthostatic hypotension    HTN (hypertension)    Tremors of nervous system    T2DM (type 2 diabetes mellitus) (HCC)    History of neck surgery    Gait abnormality    Right hemiparesis (HCC)    Cirrhosis (HCC)      ASSESSMENT & PLAN     Mitchell Lucero is a 68 y.o. male who is currently admitted to inpatient rehabilitation for Left pontine stroke (HCC).    Rehabilitation Plan  Rehabilitation: Patient will continue with comprehensive therapies including physical therapy, occupational therapy, speech & language pathology, specialized rehab nursing, neuropsychology and physiatry oversight.    Goals: household mobility, at ambulation level, Modified independent, Progressing  Recommended therapy after discharge: Home with Assistance, Home Health Setting  Recommended equipment: (AFO-possibly)  Tentative discharge date: 02/22/19    Daily Functional Update:  Transfers        Transfer: Assistive Device: Roller Walker (02/18/2019  1:30 PM)    Transfer: Sit to stand assist level: Stand by assistance (02/18/2019  1:30 PM)    Transfer: Stand pivot assist level: Contact guard assistance (02/18/2019  1:30 PM)    No data recorded   Gait/ Mobility        Gait: Assistive Device: Nurse, adult;Wheelchair Follow;AFO: Ankle Foot Orthosis (02/18/2019 10:15 AM)    GAIT: Assist Level: Stand by assistance (02/18/2019 10:15 AM)    Gait Distance: 190 feet (02/18/2019 10:15 AM)    No data recorded  No data recorded   Toileting        Toileting Assist: Stand By Assist (02/16/2019  2:30 PM)    Toileting Equipment: Grab bar - left (02/05/2019  1:40 PM)    Toilet Transfer Assist: Contact guard assistance (02/17/2019  1:00 PM)     Dressing LE Dressing Assist: Stand By Assist (02/18/2019  7:15 AM)    UE Dressing Assist: Stand By Assist (02/18/2019  7:15 AM) Left pontine stroke (CVA), 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  Encephalopathy s/p L pontine CVA  Mild Right Hemiparesis especially with fatigue and dynamic activities  Cognitive Impairment  ???????????????????????????????????????>???seen at [facility] [place], [facility]  ???????????????????????????????????????>???treated and sent to rehab, dc from rehab and had multiple falls  ???????????????????????????????????????>???worsening dysarthria, RUE weakness/involuntary movement noted on 01/31/2019  ??????????????????????????????????????????????????????????????????????????????>???transferred to [facility]  ??????????????????????????????????????????????????????????????????????????????>???Routine EEG shows mild encephalopathy, no epileptiform activity  ??????????????????????????????????????????????????????????????????????????????>???neurology ddx includes TIA vs neurodegenerative condition  - MRI brain:???with Signal abnormality without atrophy involving the left aspect of the ???pons and left middle cerebellar peduncle corresponding with his recent pontine infarct.lack of significant atrophy somewhat goes against brainstem/cerebellar neurodegenerative diseases.  ??????????????????????????????????????????????????????????????????????????????>???per neuro: significant ischemic white matter disease although underlying MSA or other Parkinsonism cannot be ruled out at this time.  ??????????????????????????????????????????????????????????????????????????????>???f/u OP with [facility] neurology/requested   ??????????????????????????????????????????????????????????????????????????????>???continue ASA, Plavix, Statin  ??????????????????????????????????????????????????????????????????????????????>???consult OT, PT, ST  ???  CARDIO:  HLD, HTN  Orthostatic Hypotension  ???????????????????????????????????????>???TTE normal EF, moderate LA dilation, no thrombus/shunt.  ???????????????????????????????????????>???continue ???recc Abd Binder and Ted Hose  ??????????????????????????????????????????????????????????????????????????????>???Goal long term BP from stroke perspective would be a little higher ideally around 110-140/70-90  ???????????????????????????????????????>???continue Midodrine, increased dose on 7/29 due to orthostatic hypotension o/n  ???????????????????????????????????????>???dc PTA flomax and Acei  ???????????????????????????????????????>???event monitor on d/c for further stroke risk factor assessment   ???????????????????????????????????????>???f/u cardio upon DC  ???  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  ENDO:  T2DM  -PTA Levemir 10 unites QHS and metformin 500 mg BID  ???????????????????????????????????????>???hold metformin, decrease lantus to 8 units QHS 7/30 due to tight fasting glucose control, LDCF  ???  DERM:  Bilateral LEs (posterior thighs) petechial skin rash, resolved  > a/w thrombocytopenia in the setting of HCC  > Ddx TTP versus contact dermatitis vs liver disease?  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  Pain Management:???pain is well controlled, heat / ice PRN and Tylenol PRN  Bowel:???continent of bowel.  Goal 2-3 BMs per day, continue lactulose  Bladder:???incontinent of bladder at night due to urgency, see above  Nutrition:???Current diet: ???diabetic  Mental Health:???consult neuropsychology to provide support / counseling    DVT ppx: heparin (porcine) PF syringe 5,000 Units  Q8H  Code Status: Full Code    Current Medication List:  Scheduled Meds:aspirin chewable tablet 81 mg, 81 mg, Oral, QDAY  atorvastatin (LIPITOR) tablet 80 mg, 80 mg, Oral, QHS  bimatoprost (LUMIGAN) 0.03 % (+) ophthalmic solution 1 drop (Patien'ts Own Med), 1 drop, Both Eyes, QDAY(21)  citalopram (CeleXA) tablet 20 mg, 20 mg, Oral, QDAY  clopiDOGrel (PLAVIX) tablet 75 mg, 75 mg, Oral, QDAY  docusate (COLACE) capsule 100 mg, 100 mg, Oral, BID  heparin (porcine) PF syringe 5,000 Units, 5,000 Units, Subcutaneous, Q8H  insulin aspart U-100 (NOVOLOG FLEXPEN) injection PEN 0-6 Units, 0-6 Units, Subcutaneous, ACHS (22) insulin glargine (LANTUS SOLOSTAR) injection PEN 8 Units, 8 Units, Subcutaneous, QHS  lactulose oral solution 20 g, 30 mL, Oral, TID  melatonin tablet 3 mg, 3 mg, Oral, QHS  midodrine (PROAMATINE) tablet 2.5 mg, 2.5 mg, Oral, TID (02-23-16)  pantoprazole DR (PROTONIX) tablet 20 mg, 20 mg, Oral, QDAY(21)  polyethylene glycol 3350 (MIRALAX) packet 17 g, 1 packet, Oral, QDAY  senna (SENOKOT) tablet 2 tablet, 2 tablet, Oral, QHS  vitamin A & D topical ointment, , Topical, BID    Continuous Infusions:  PRN and Respiratory Meds:acetaminophen Q4H PRN, aluminum/magnesium hydroxide Q4H PRN, milk of magnesia (CONC) Q4H PRN, ondansetron Q6H PRN      02/19/19 Assessment and Changes in Care Plan  Continued orthostatic hypotension.  wearing TED hose.  CTM blood pressures.  Discussion with family about R-sided tremors - do not think these are seizures.  Will continue to monitor.      SUBJECTIVE/ History:     Patient seen resting comfortably at bedside this morning without any signs of acute distress.  No significant events noted overnight by nursing staff. Patient denies headache, chest pain, shortness of breath, abdominal pain, or myalgia.    Continued orthostatic hypotension.  No supine HTN this morning.  Urination improving.    OBJECTIVE/ Physical Exam:     Vitals:    02/17/19 1858 02/18/19 0608 02/18/19 1556 02/19/19 0536   BP: (!) 151/82 (!) 141/79 (!) 147/79 139/74   BP Source: Arm, Right Upper  Arm, Right Upper Arm, Right Upper Arm, Left Upper   Pulse: 100 98 91 80   Temp:  36.8 ???C (98.2 ???F) 36.9 ???C (98.5 ???F) 36.9 ???C (98.4 ???F)   SpO2: 98% 98% 99% 96%   Weight:       Height:         Vitals:    02/09/19 1500 02/13/19 0655 02/14/19 0603   Weight: 72.5 kg (159 lb 13.3 oz) 71.9 kg (158 lb 9.6 oz) 71.9 kg (158 lb 9.6 oz)       Constitutional: no acute distress, appears stated age  ENMT: supple  Cardiovascular: Well perfused  Respiratory: normal effort with respirations  Gastrointestinal: nondistended Extremities: No edema, TED hose on BLE  Skin: Warm and dry, bruising abdomen  Psychiatric:  Appropriate mood and affect  Musculoskeletal: No atrophy  Neurologic: speech fluent and clear, appropriate responses to questions      RESULTS AND DATA REVIEWED:     Last Bowel Movement Date: 02/17/19 (02/18/2019 12:15 PM)      Results for orders placed or performed during the hospital encounter of 02/04/19 (from the past 24 hour(s))   POC GLUCOSE    Collection Time: 02/18/19 11:49 AM   # # Low-High    Glucose, POC 204 (H) 70 - 100 MG/DL   POC GLUCOSE    Collection Time: 02/18/19  9:51 PM   # # Low-High    Glucose, POC 176 (H) 70 - 100 MG/DL   POC GLUCOSE    Collection Time: 02/19/19  6:53 AM   # # Low-High    Glucose, POC 101 (H) 70 - 100 MG/DL       Chart reviewed, nursing notes and therapy notes reviewed.   Case discussed with the attending physician.

## 2019-02-20 LAB — POC GLUCOSE
Lab: 190 mg/dL — ABNORMAL HIGH (ref 70–100)
Lab: 88 mg/dL (ref 70–100)
Lab: 224 mg/dL — ABNORMAL HIGH (ref 70–100)
Lab: 182 mg/dL — ABNORMAL HIGH (ref 70–100)

## 2019-02-20 NOTE — Progress Notes
Rehabilitation Medicine Progress Note     Date of Service: 02/20/2019  Date of Admission: 02/04/2019  Length of Stay: 16 days    Principal Problem:    Left pontine stroke [facility])  Active Problems:    Orthostatic hypotension    HTN (hypertension)    Tremors of nervous system    T2DM (type 2 diabetes mellitus) (HCC)    History of neck surgery    Gait abnormality    Right hemiparesis (HCC)    Cirrhosis (HCC)      ASSESSMENT & PLAN     Mitchell Lucero is a 68 y.o. male who is currently admitted to inpatient rehabilitation for Left pontine stroke (HCC).    Rehabilitation Plan  Rehabilitation: Patient will continue with comprehensive therapies including physical therapy, occupational therapy, speech & language pathology, specialized rehab nursing, neuropsychology and physiatry oversight.    Goals: household mobility, at ambulation level, Modified independent, Progressing  Recommended therapy after discharge: Home with Assistance, Home Health Setting  Recommended equipment: (AFO-possibly)  Tentative discharge date: 02/22/19    Daily Functional Update:  Transfers        Transfer: Assistive Device: Roller Walker (02/18/2019  1:30 PM)    Transfer: Sit to stand assist level: Stand by assistance (02/18/2019  1:30 PM)    Transfer: Stand pivot assist level: Contact guard assistance (02/18/2019  1:30 PM)    No data recorded   Gait/ Mobility        Gait: Assistive Device: Nurse, adult;Wheelchair Follow;AFO: Ankle Foot Orthosis (02/18/2019 10:15 AM)    GAIT: Assist Level: Stand by assistance (02/18/2019 10:15 AM)    Gait Distance: 190 feet (02/18/2019 10:15 AM)    No data recorded  No data recorded   Toileting        Toileting Assist: Stand By Assist (02/16/2019  2:30 PM)    Toileting Equipment: Grab bar - left (02/05/2019  1:40 PM)    Toilet Transfer Assist: Contact guard assistance (02/17/2019  1:00 PM)     Dressing LE Dressing Assist: Stand By Assist (02/18/2019  7:15 AM)    UE Dressing Assist: Stand By Assist (02/18/2019  7:15 AM) Left pontine stroke (CVA), 12/18/2018  Encephalopathy s/p L pontine CVA  Mild Right Hemiparesis especially with fatigue and dynamic activities  Cognitive Impairment  ???????????????????????????????????????>???seen at [facility] [place], [facility]  ???????????????????????????????????????>???treated and sent to rehab, dc from rehab and had multiple falls  ???????????????????????????????????????>???worsening dysarthria, RUE weakness/involuntary movement noted on 01/31/2019  ??????????????????????????????????????????????????????????????????????????????>???transferred to [facility]  ??????????????????????????????????????????????????????????????????????????????>???Routine EEG shows mild encephalopathy, no epileptiform activity  ??????????????????????????????????????????????????????????????????????????????>???neurology ddx includes TIA vs neurodegenerative condition  - MRI brain:???with Signal abnormality without atrophy involving the left aspect of the ???pons and left middle cerebellar peduncle corresponding with his recent pontine infarct.lack of significant atrophy somewhat goes against brainstem/cerebellar neurodegenerative diseases.  ??????????????????????????????????????????????????????????????????????????????>???per neuro: significant ischemic white matter disease although underlying MSA or other Parkinsonism cannot be ruled out at this time.  ??????????????????????????????????????????????????????????????????????????????>???f/u OP with [facility] neurology/requested   ??????????????????????????????????????????????????????????????????????????????>???continue ASA, Plavix, Statin  ??????????????????????????????????????????????????????????????????????????????>???consult OT, PT, ST  ???  CARDIO:  HLD, HTN  Orthostatic Hypotension  ???????????????????????????????????????>???TTE normal EF, moderate LA dilation, no thrombus/shunt.  ???????????????????????????????????????>???continue ???recc Abd Binder and Ted Hose  ??????????????????????????????????????????????????????????????????????????????>???Goal long term BP from stroke perspective would be a little higher ideally around 110-140/70-90  ???????????????????????????????????????>???continue Midodrine, increased dose on 7/29 due to orthostatic hypotension o/n  ???????????????????????????????????????>???dc PTA flomax and Acei  ???????????????????????????????????????>???event monitor on d/c for further stroke risk factor assessment   ???????????????????????????????????????>???f/u cardio upon DC  ???  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  ENDO:  T2DM  -PTA Levemir 10 unites QHS and metformin 500 mg BID  ???????????????????????????????????????>???hold metformin, decrease lantus to 8 units QHS 7/30 due to tight fasting glucose control, LDCF  ???  DERM:  Bilateral LEs (posterior thighs) petechial skin rash, resolved  > a/w thrombocytopenia in the setting of HCC  > Ddx TTP versus contact dermatitis vs liver disease?  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  Pain Management:???pain is well controlled, heat / ice PRN and Tylenol PRN  Bowel:???continent of bowel.  Goal 2-3 BMs per day, continue lactulose  Bladder:???incontinent of bladder at night due to urgency, see above  Nutrition:???Current diet: ???diabetic  Mental Health:???consult neuropsychology to provide support / counseling    DVT ppx: heparin (porcine) PF syringe 5,000 Units  Q8H  Code Status: Full Code    Current Medication List:  Scheduled Meds:aspirin chewable tablet 81 mg, 81 mg, Oral, QDAY  atorvastatin (LIPITOR) tablet 80 mg, 80 mg, Oral, QHS  bimatoprost (LUMIGAN) 0.03 % (+) ophthalmic solution 1 drop (Patien'ts Own Med), 1 drop, Both Eyes, QDAY(21)  citalopram (CeleXA) tablet 20 mg, 20 mg, Oral, QDAY  clopiDOGrel (PLAVIX) tablet 75 mg, 75 mg, Oral, QDAY  docusate (COLACE) capsule 100 mg, 100 mg, Oral, BID  heparin (porcine) PF syringe 5,000 Units, 5,000 Units, Subcutaneous, Q8H  insulin aspart U-100 (NOVOLOG FLEXPEN) injection PEN 0-6 Units, 0-6 Units, Subcutaneous, ACHS (22) insulin glargine (LANTUS SOLOSTAR) injection PEN 8 Units, 8 Units, Subcutaneous, QHS  lactulose oral solution 20 g, 30 mL, Oral, TID  melatonin tablet 3 mg, 3 mg, Oral, QHS  midodrine (PROAMATINE) tablet 2.5 mg, 2.5 mg, Oral, TID (02-23-16)  pantoprazole DR (PROTONIX) tablet 20 mg, 20 mg, Oral, QDAY(21)  polyethylene glycol 3350 (MIRALAX) packet 17 g, 1 packet, Oral, QDAY  senna (SENOKOT) tablet 2 tablet, 2 tablet, Oral, QHS  vitamin A & D topical ointment, , Topical, BID    Continuous Infusions:  PRN and Respiratory Meds:acetaminophen Q4H PRN, aluminum/magnesium hydroxide Q4H PRN, milk of magnesia (CONC) Q4H PRN, ondansetron Q6H PRN      02/20/19 Assessment and Changes in Care Plan  No HTN this morning.  Continued tremors, apraxia with BLE movements yesterday (couldn't walk out of bathroom - says he couldn't tell his legs to move).  If this continues, we may need to discuss this again with neurology.      Edd Fabian, M.D.    SUBJECTIVE/ History:     Patient seen resting comfortably at bedside this morning without any signs of acute distress.  No significant events noted overnight by nursing staff. Patient denies headache, chest pain, shortness of breath, abdominal pain, or myalgia.    Continued tremors, apraxia with BLE movements yesterday (couldn't walk out of bathroom - says he couldn't tell his legs to move).    OBJECTIVE/ Physical Exam:     Vitals:    02/18/19  1556 02/19/19 0536 02/19/19 1850 02/20/19 0535   BP: (!) 147/79 139/74 136/75 137/85   BP Source: Arm, Right Upper Arm, Left Upper Arm, Right Upper Arm, Right Upper   Pulse: 91 80 87 84   Temp: 36.9 ???C (98.5 ???F) 36.9 ???C (98.4 ???F) 37 ???C (98.6 ???F) 36.7 ???C (98.1 ???F)   SpO2: 99% 96% 98% 94%   Weight:       Height:         Vitals:    02/09/19 1500 02/13/19 0655 02/14/19 0603   Weight: 72.5 kg (159 lb 13.3 oz) 71.9 kg (158 lb 9.6 oz) 71.9 kg (158 lb 9.6 oz)       Constitutional: no acute distress, appears stated age  ENMT: supple Cardiovascular: Well perfused  Respiratory: normal effort with respirations  Gastrointestinal: nondistended  Extremities: No edema, TED hose on BLE  Skin: Warm and dry, bruising abdomen  Psychiatric:  Appropriate mood and affect  Musculoskeletal: No atrophy  Neurologic: speech fluent and clear, appropriate responses to questions      RESULTS AND DATA REVIEWED:     Last Bowel Movement Date: 02/19/19 (02/19/2019  8:48 PM)      Results for orders placed or performed during the hospital encounter of 02/04/19 (from the past 24 hour(s))   POC GLUCOSE    Collection Time: 02/19/19 12:08 PM   # # Low-High    Glucose, POC 193 (H) 70 - 100 MG/DL   POC GLUCOSE    Collection Time: 02/19/19  6:02 PM   # # Low-High    Glucose, POC 162 (H) 70 - 100 MG/DL   POC GLUCOSE    Collection Time: 02/19/19 10:11 PM   # # Low-High    Glucose, POC 190 (H) 70 - 100 MG/DL   POC GLUCOSE    Collection Time: 02/20/19  6:58 AM   # # Low-High    Glucose, POC 88 70 - 100 MG/DL       Chart reviewed, nursing notes and therapy notes reviewed.

## 2019-02-20 NOTE — Progress Notes
PHYSICAL THERAPY     02/20/19 0945   Subjective   Subjective Pt requesting toileting tasks upon PT entry. Spouse present during therapy session and reporting pt had a bad day yesterday with those episodes.   Transfers   Transfer: Hospital doctor   Transfer: Sit to stand assist level Stand by assistance   Transfer: Sit to stand type of assist For safety;Requires cues for positioning of hands   Transfer: Stand to sit assist level Stand by assistance   Transfer: Stand to sit type of assist For safety;Requires cues for positioning of hands   Transfer: Stand pivot assist level Contact guard assistance   Transfer: Stand pivot type of assist For safety   Transfer Comment Pt requires cues for fully pivoting and backing up prior to sitting down as well as CGA for safety.   Gait   Gait Distance 150 feet  (x 2)   GAIT: Assist Level Contact guard assistance  (plus spouse providing wheelchair follow for safety)   GAIT: Type of Assist For safety   Gait: Assistive Device Roller Walker;Wheelchair Follow;AFO: Ankle Foot Orthosis   Gait: Patterns Trunk lean forward;Decreased gait velocity   Gait: Deviation Right Knee hyperextension/genu recurvatum;Decreased hip flexion   Activity Limited By Complaint of fatigue   Gait Comment Pt ambulates with theraband tied to lower walker cross bars to assist with a target to touch to increase R LE step side and hip flexion during gait and globally assist with keeping pt closer to roller walker during gait.    Activity   PT Therapeutic Activities Increased time taken at beginning of session to complete in room mobility, toilet transfers and toileting tasks (attempted to have BM without success).  Vitals monitored during mobility tasks today: BP 146-150/86, SpO2 >92% and pt without any shaky or disengaged symptoms during mobility today.    Assessment   Assessment Pt tolerates session well and does not have any BP abnormalities or other shaking symptoms (as spouse reported were happening yesterday).  He does endorse fatigue today but is able to tolerate entire session.    Plan   Comments HIGT program, proximal strengthening, stairs, dynamic stepping practice, AFO trial.

## 2019-02-20 NOTE — Progress Notes
Activity Therapy   Length of session: 5 minutes    Subjective    Pt presented with pleasant and calm affect. "I'm getting ready to take a nap. My wife will be back soon. I have puzzles, games, and magazines to look at." AT session was brief.    Objective    Pt not requiring AT this date.    Assessment NA    Plan [place] pt from AT services d/t upcoming IRF DC.        Pauline Aus, Activity Therapist, MA MT-BC  Board Certified - Music Therapist  Voalte Me @ Pauline Aus

## 2019-02-21 ENCOUNTER — Encounter: Admit: 2019-02-21 | Discharge: 2019-02-21

## 2019-02-21 ENCOUNTER — Emergency Department: Admit: 2019-02-21 | Discharge: 2019-02-21

## 2019-02-21 ENCOUNTER — Observation Stay: Admit: 2019-02-21 | Discharge: 2019-02-23

## 2019-02-21 ENCOUNTER — Inpatient Hospital Stay: Admit: 2019-02-04 | Discharge: 2019-02-21

## 2019-02-21 DIAGNOSIS — K7460 Unspecified cirrhosis of liver: Secondary | ICD-10-CM

## 2019-02-21 DIAGNOSIS — F411 Generalized anxiety disorder: Secondary | ICD-10-CM

## 2019-02-21 DIAGNOSIS — I69322 Dysarthria following cerebral infarction: Secondary | ICD-10-CM

## 2019-02-21 DIAGNOSIS — F330 Major depressive disorder, recurrent, mild: Secondary | ICD-10-CM

## 2019-02-21 DIAGNOSIS — I10 Essential (primary) hypertension: Secondary | ICD-10-CM

## 2019-02-21 DIAGNOSIS — R251 Tremor, unspecified: Secondary | ICD-10-CM

## 2019-02-21 DIAGNOSIS — R471 Dysarthria and anarthria: Secondary | ICD-10-CM

## 2019-02-21 DIAGNOSIS — R188 Other ascites: Secondary | ICD-10-CM

## 2019-02-21 DIAGNOSIS — I951 Orthostatic hypotension: Secondary | ICD-10-CM

## 2019-02-21 DIAGNOSIS — R634 Abnormal weight loss: Secondary | ICD-10-CM

## 2019-02-21 DIAGNOSIS — G459 Transient cerebral ischemic attack, unspecified: Secondary | ICD-10-CM

## 2019-02-21 DIAGNOSIS — I69851 Hemiplegia and hemiparesis following other cerebrovascular disease affecting right dominant side: Secondary | ICD-10-CM

## 2019-02-21 DIAGNOSIS — K7581 Nonalcoholic steatohepatitis (NASH): Secondary | ICD-10-CM

## 2019-02-21 DIAGNOSIS — R4781 Slurred speech: Secondary | ICD-10-CM

## 2019-02-21 DIAGNOSIS — E119 Type 2 diabetes mellitus without complications: Secondary | ICD-10-CM

## 2019-02-21 DIAGNOSIS — R233 Spontaneous ecchymoses: Secondary | ICD-10-CM

## 2019-02-21 DIAGNOSIS — R296 Repeated falls: Secondary | ICD-10-CM

## 2019-02-21 DIAGNOSIS — R9082 White matter disease, unspecified: Secondary | ICD-10-CM

## 2019-02-21 DIAGNOSIS — K766 Portal hypertension: Secondary | ICD-10-CM

## 2019-02-21 DIAGNOSIS — E785 Hyperlipidemia, unspecified: Secondary | ICD-10-CM

## 2019-02-21 DIAGNOSIS — Z87891 Personal history of nicotine dependence: Secondary | ICD-10-CM

## 2019-02-21 DIAGNOSIS — R269 Unspecified abnormalities of gait and mobility: Secondary | ICD-10-CM

## 2019-02-21 DIAGNOSIS — Z8505 Personal history of malignant neoplasm of liver: Secondary | ICD-10-CM

## 2019-02-21 LAB — POC CREATININE, RAD: Lab: 0.7 mg/dL — ABNORMAL HIGH (ref 0.4–1.24)

## 2019-02-21 LAB — POC GLUCOSE
Lab: 209 mg/dL — ABNORMAL HIGH (ref 70–100)
Lab: 130 mg/dL — ABNORMAL HIGH (ref 70–100)
Lab: 214 mg/dL — ABNORMAL HIGH (ref 70–100)
Lab: 239 mg/dL — ABNORMAL HIGH (ref 70–100)
Lab: 209 mg/dL — ABNORMAL HIGH (ref 70–100)

## 2019-02-21 LAB — BASIC METABOLIC PANEL
Lab: 102 MMOL/L (ref 98–110)
Lab: 136 MMOL/L — ABNORMAL LOW (ref 137–147)
Lab: 220 mg/dL — ABNORMAL HIGH (ref 70–100)
Lab: 28 MMOL/L (ref 21–30)
Lab: 3.8 MMOL/L — ABNORMAL LOW (ref 3.5–5.1)
Lab: 6 g/dL (ref 3–12)

## 2019-02-21 LAB — BASIC METABOLIC PANEL CELLULAR THERAPEUTICS: Lab: 137 MMOL/L — ABNORMAL LOW (ref >60)

## 2019-02-21 LAB — CBC AND DIFF
Lab: 0 10*3/uL (ref 0–0.20)
Lab: 0.2 10*3/uL (ref 0–0.45)
Lab: 0.6 10*3/uL (ref 0–0.80)
Lab: 2 10*3/uL (ref 1.0–4.8)
Lab: 3.6 10*3/uL (ref 1.8–7.0)
Lab: 3.7 M/UL — ABNORMAL LOW (ref 4.4–5.5)
Lab: 6.6 10*3/uL (ref 4.5–11.0)

## 2019-02-21 LAB — POC TROPONIN: Lab: 0 ng/mL (ref 0.00–0.05)

## 2019-02-21 LAB — POC PT/INR: Lab: 1 (ref 0.8–1.2)

## 2019-02-21 LAB — CBC CELLULAR THERAPEUTICS: Lab: 6.8 K/UL — ABNORMAL LOW (ref >60)

## 2019-02-21 MED ORDER — MELATONIN 3 MG PO TAB
3 mg | Freq: Every evening | ORAL | 0 refills | Status: DC
Start: 2019-02-21 — End: 2019-02-23
  Administered 2019-02-23: 02:00:00 3 mg via ORAL

## 2019-02-21 MED ORDER — HELP MEDICATION: 0 refills | Status: DC

## 2019-02-21 MED ORDER — ASPIRIN 81 MG PO TBEC
81 mg | Freq: Every morning | ORAL | 0 refills | Status: DC
Start: 2019-02-21 — End: 2019-02-23
  Administered 2019-02-22 – 2019-02-23 (×2): 81 mg via ORAL

## 2019-02-21 MED ORDER — SODIUM CHLORIDE 0.9 % IV SOLP
500 mL | INTRAVENOUS | 0 refills | Status: CP
Start: 2019-02-21 — End: ?
  Administered 2019-02-22: 02:00:00 500 mL via INTRAVENOUS

## 2019-02-21 MED ORDER — CLOPIDOGREL 75 MG PO TAB
75 mg | Freq: Every day | ORAL | 0 refills | Status: DC
Start: 2019-02-21 — End: 2019-02-23
  Administered 2019-02-22 – 2019-02-23 (×2): 75 mg via ORAL

## 2019-02-21 MED ORDER — DOCUSATE SODIUM 100 MG PO CAP
100 mg | Freq: Two times a day (BID) | ORAL | 0 refills | Status: DC
Start: 2019-02-21 — End: 2019-02-23
  Administered 2019-02-22 – 2019-02-23 (×3): 100 mg via ORAL

## 2019-02-21 MED ORDER — IOHEXOL 350 MG IODINE/ML IV SOLN
100 mL | Freq: Once | INTRAVENOUS | 0 refills | Status: CP
Start: 2019-02-21 — End: ?
  Administered 2019-02-21: 21:00:00 100 mL via INTRAVENOUS

## 2019-02-21 MED ORDER — DOCUSATE SODIUM 100 MG PO CAP
100 mg | ORAL_CAPSULE | Freq: Two times a day (BID) | ORAL | 0 refills | 30.00000 days | Status: DC
Start: 2019-02-21 — End: 2019-03-09

## 2019-02-21 MED ORDER — ACETAMINOPHEN 325 MG PO TAB
650 mg | ORAL | 0 refills | 8.00000 days | Status: DC | PRN
Start: 2019-02-21 — End: 2019-03-09

## 2019-02-21 MED ORDER — MIDODRINE 5 MG PO TAB
2.5 mg | Freq: Three times a day (TID) | ORAL | 0 refills | Status: DC
Start: 2019-02-21 — End: 2019-02-22
  Administered 2019-02-22: 15:00:00 2.5 mg via ORAL

## 2019-02-21 MED ORDER — ACETAMINOPHEN 325 MG PO TAB
650 mg | ORAL | 0 refills | Status: DC | PRN
Start: 2019-02-21 — End: 2019-02-23

## 2019-02-21 MED ORDER — MELATONIN 3 MG PO TAB
3 mg | Freq: Every evening | ORAL | 0 refills | 28.00000 days | Status: AC
Start: 2019-02-21 — End: ?

## 2019-02-21 MED ORDER — POLYETHYLENE GLYCOL 3350 17 GRAM PO PWPK
17 g | Freq: Every day | ORAL | 0 refills | 22.00000 days | Status: DC
Start: 2019-02-21 — End: 2019-03-09

## 2019-02-21 MED ORDER — CITALOPRAM 10 MG PO TAB
20 mg | Freq: Every morning | ORAL | 0 refills | Status: DC
Start: 2019-02-21 — End: 2019-02-23
  Administered 2019-02-22 – 2019-02-23 (×2): 20 mg via ORAL

## 2019-02-21 MED ORDER — INSULIN DETEMIR U-100 100 UNIT/ML SC SOLN
8 [IU] | Freq: Every evening | SUBCUTANEOUS | 0 refills | Status: DC
Start: 2019-02-21 — End: 2019-03-09

## 2019-02-21 MED ORDER — MIDODRINE 2.5 MG PO TAB
2.5 mg | ORAL_TABLET | Freq: Three times a day (TID) | ORAL | 1 refills | 30.00000 days | Status: DC
Start: 2019-02-21 — End: 2019-02-23

## 2019-02-21 MED ORDER — PANTOPRAZOLE 20 MG PO TBEC
20 mg | ORAL_TABLET | Freq: Every day | ORAL | 1 refills | 90.00000 days | Status: AC
Start: 2019-02-21 — End: ?

## 2019-02-21 MED ORDER — LACTULOSE 10 GRAM/15 ML PO SOLN WRAPPER
20 g | Freq: Three times a day (TID) | ORAL | 0 refills | Status: DC
Start: 2019-02-21 — End: 2019-02-23
  Administered 2019-02-22 – 2019-02-23 (×4): 20 g via ORAL

## 2019-02-21 MED ORDER — VITS A AND D-WHITE PET-LANOLIN TP OINT
Freq: Two times a day (BID) | TOPICAL | 0 refills | Status: DC
Start: 2019-02-21 — End: 2019-03-16

## 2019-02-21 MED ORDER — ATORVASTATIN 40 MG PO TAB
80 mg | Freq: Every evening | ORAL | 0 refills | Status: DC
Start: 2019-02-21 — End: 2019-02-23
  Administered 2019-02-23: 02:00:00 80 mg via ORAL

## 2019-02-21 MED ORDER — ENOXAPARIN 40 MG/0.4 ML SC SYRG
40 mg | Freq: Every day | SUBCUTANEOUS | 0 refills | Status: DC
Start: 2019-02-21 — End: 2019-02-23
  Administered 2019-02-22 – 2019-02-23 (×2): 40 mg via SUBCUTANEOUS

## 2019-02-21 MED ORDER — PANTOPRAZOLE 20 MG PO TBEC
20 mg | Freq: Every day | ORAL | 0 refills | Status: DC
Start: 2019-02-21 — End: 2019-02-23
  Administered 2019-02-23: 02:00:00 20 mg via ORAL

## 2019-02-21 MED ORDER — LACTULOSE 10 GRAM/15 ML PO SOLN WRAPPER
20 g | Freq: Three times a day (TID) | ORAL | 1 refills | 21.00000 days | Status: DC
Start: 2019-02-21 — End: 2019-03-09

## 2019-02-21 MED ORDER — SODIUM CHLORIDE 0.9 % IJ SOLN
50 mL | Freq: Once | INTRAVENOUS | 0 refills | Status: CP
Start: 2019-02-21 — End: ?
  Administered 2019-02-21: 21:00:00 50 mL via INTRAVENOUS

## 2019-02-21 NOTE — ED Notes
Xray at bedside

## 2019-02-21 NOTE — Progress Notes
SPEECH-LANGUAGE PATHOLOGY  DISCHARGE SUMMARY     02/21/19 1000   Behavior   Comments* Patient reported that he had not been feeling well earlier however he no longer felt nauseated and wanted to participate.  His wife was bedside. She reported that her husband was not himself  This past weekend. Continued with re-assessment of patient's cognition due to discharge from rehab scheduled for tomorrow.   Orientation Goals   Therapy Activities Cognitive log score   Cognitive Log Score (Out of 30) 25/30   Patient Will be Oriented (to Person, Place, Time and Situation) to Improve Safety and Awareness for Daily Living with Met   Memory Goals   Therapy Activity Comments RBANS-B Immediate Memory: List Learning: 27/40. SS=10 (Average). Story Memory: 18/24. SS=11 (Average). Index Score=103 (Average). Delayed Memory: List Recall: 6/10. List Recognition: 20/20. Story Recall: 10/12. SS=11 (Average). Figure Recall: 13/20 SS=10 (Average) Index Score=102 (Average). VisuoSpatial/Constructional: Figure Copy: 11/20. SS=2 (Extremely Low). Line Orientation: 10/20.  Index Score=82 Extremely Low.     Patient Will Use a Strategy to Recall Verbally Presented Information with Met;Standby prompting - patient able to > 90% of the time, prompting < 10%   Patient Will Use Appropriate Memory Strategies to Recall Recent Events, Express Needs, and Maintain Safety for Daily Living with Not progressing;Minimum assist   Attention Goals   Therapy Activity Comments RBANS-B. Attention: Digit Span: 7/16.SS=5 (Borderline).  Coding: 10/89. SS=1 (Extremely Low) Index Score=53 (Extremely Low).    Patient Will Complete Sustained Attention Task with Met;Modified independence - mild difficulty but no prompting   Patient Will Demonstrate Attention Skills to Improve Safety with Daily Activities and Communication with Met;Minimum assist   Problem Solving Goals   Patient Will Provide Appropriate Solutions to Problems of Daily Living with Met;Standby prompting - patient able to > 90% of the time, prompting < 10%   Patient Will Be Able to Problem Solve Safety and Awareness for Daily Living Skills with Met;Minimum assist   Reading Goals   Short Term Goals Patient will demonstrate functional reading comprehension of signs, menus, instructions, labels, etc. with   Patient Will Demonstrate Functional Reading Comprehension of Signs, Menus, Instructions, Labels, etc. with Modified independence - extra time;Met   Patient Will Exhibit Functional Reading Skills with Met;Minimum assist       RBANS ??? Repeatable Battery for the Assessment of Neuropsychological Status, Form B 02/21/2019  Domain Subtest Total Score Index Score Classification Notes   Immediate Memory List Learning 27/40   103 SS=10  Average   Immediate Memory: Average    Story Memory 18/24  SS=11  Average    Visuospatial/  constructional Figure Copy 11/20    62 SS=2  Extremely Low   Visuospatial: Extremely Low  Observed mild inattention to the left during coding task.    Line Orientation 10/20      Language Picture Naming 10/10    85      Language: Low Average    Semantic Fluency 12/40  SS=4  Borderline    Attention Digit Span 10/16    53 SS=5  Borderline   Attention: Extremely Low    Coding 10/89  SS=1  Extremely Low    Delayed Memory List Recall 6/10   102    Delayed Memory: Average    List Recognition 20/20       Story Recall 10/12  SS=11  Average     Figure Recall 13/20  SS=10  Average    Sum of  3 Index Scores  29   405     TOTAL SCALE    75 Borderline 5th percentile         RBANS ??? Repeatable Battery for the Assessment of Neuropsychological Status, Form A 02/04/2019  Domain Subtest Total Score Index Score Classification Notes   Immediate Memory List Learning 18/40 61 Extremely low ???   ??? Story Memory 9/24 ??? ??? ???   Visuospatial/  constructional Figure Copy 15/20 81 Low average ???   ??? Line Orientation 16/20 ??? ??? ???   Language Picture Naming 10/10 82 Low average  ??? ??? ??? Semantic Fluency 11/40 ??? ??? ???   Attention Digit Span 8/16 TBD TBD ???   ??? Coding TBD/89 ??? ??? ???   Delayed Memory List Recall 2/10 92 Average ???   ??? List Recognition 20/20 ??? ??? ???   ??? Story Recall 5/12 ??? ??? ???   ??? Figure Recall 10/20 ??? ??? ???   Sum of Index Scores ??? ??? ??? ???TBD ???   TOTAL SCALE ??? ??? ???  ???      Waynette Buttery.A.L/CCC/SLP

## 2019-02-21 NOTE — Progress Notes
Date: 02/21/2019 Time: 3:29 PM  Patient: Mitchell Lucero  Service: Rehab Medicine  Admission Date: 02/04/2019  LOS: 17 days    EVENT NOTE:    At around 1400 this afternoon I was paged by the physical therapist that the patient was declining therapy due to significant fatigue, nausea and vomiting.  He appeared lethargic and declining in mobility and function.  He was seated in the chair with wife at bedside and require maximum assistance of 2 with mobility, he demonstrated a right-sided truncal instability unable to remain midline.  He was seen earlier this morning and complain of nausea and vomiting, his speech was slurred with worsening left-sided tremors.  I then evaluated him again at 1400 this afternoon and acclivity saw a market decline in neurological status, functional mobility, worsening slurred speech, lethargic, still able to answer questions, oriented, but actively vomiting, nauseated, unable to track external ocular motions with the left eye as well as he had in the past, and with equivocal left-sided pronator drift but worsening tremors.  Considering the patient's history of stroke x2 I think is very prudent to go ahead and activate a stroke code and therefore of discussed with the charge nurse and a stroke code was activated.  I discussed the case with nursing staff and with the emergency department physician at [facility].  Informed the emergency department of worsening slurred speech, lethargy, equivocal neurological deficits but market trunk instability. Patient was discharged and transferred to the emergency department. Recommended stroke work-up.     Patient Vitals for the past 24 hrs:   BP Temp Pulse SpO2 Weight   02/21/19 1429 105/64 36.7 ???C (98.1 ???F) 77 97 % ???   02/21/19 0544 134/75 36.7 ???C (98.1 ???F) 81 98 % 72.2 kg (159 lb 2.8 oz)   02/20/19 1730 (!) 147/78 37 ???C (98.6 ???F) 87 97 % ???     Gareth Morgan, M.D.   Physical Medicine and Rehabilitation (PM&R)   Resident Physician, PGY-2 02/21/2019   3:34 PM

## 2019-02-21 NOTE — Progress Notes
CLINICAL NUTRITION                                                        Clinical Nutrition Follow-Up Assessment     Name: Mitchell Lucero        MRN: [id number]          DOB: 1951/04/06          Age: 68 y.o.  Admission Date: 02/04/2019             LOS: 17 days        Recommendation:  Continue diet as ordered    Comments:  Pt is a 68 y.o. male with PMH of HTN, DM2, C6-C7 ADCF in 1997, and recent left pontine stroke in 12/18/2018 who was transferred to [facility] for a transient episode of dysarthria for 1.5 hrs in the morning of 7/20 associated with R arm large amplitude irregular, involuntary movement and both leg shaking with retained awareness. Increasing number of falls since discharge from [facility]. Unintentional wt loss of 30# x1 year. Clinical Nutrition following for poor appetite. During this admission pt was also dx with [facility], likely secondary to NASH. Hepatology and oncology following. AFP WNL. Pt was taking metformin PTA, but no longer taking per previous notes. Pt and his wife have previously received diabetic diet education. Pt???s wife managing meals and DM meds since his stroke. RD spoke with her today. She has written materials and reports confidence preparing a carbohydrate controlled diet. Pt continues eating 75-100% of all meals this admit.                            Nutrition Assessment of Patient:  Admit Weight: 70.3 kg([facility] admission weight); Weight Change Since Admit: +1.3kg  BMI (Calculated): 28.25; BMI Categories Adult: Over Weight: 25-29.9  Pertinent Allergies/Intolerances: none  Pertinent Labs: FSBS 96-225; Pertinent Meds: Reviewed; Unintentional Weight Loss: 20% in 1 year (significant)  Current Oral Intake: Adequate  Estimated Calorie Needs: 1590-1911(25-28 kcal/kg DBW)  Estimated Protein Needs: 76-82(1.2-1.3 g/kg DBW)    Malnutrition Assessment:  Does not meet criteria     Nutrition Focused Physical Assessment:  Loss of Subcutaneous Fat: No;  ; Muscle Wasting: Yes; Severity: Mild; Location: Deltoid, Interosseous, Temple  Edema: No;  ;    Pressure Injury: none noted    Nutrition Diagnosis:  Inadequate oral intake  Etiology: recent poor appetite; slowly improving  Signs & Symptoms: pt and wife reports     Intervention / Plan:  Reviewed DM education, encouraged PO intake, discussed recommendaitons  Montior weight, labs, meds, Gi symptoms, PO intake    Goals:  Patient to consume >75% of meals/supplements  Time Frame: Throughout stay  Status: Met  Aid in stabilizing blood glucose  Time Frame: Throughout stay  Status: Partially met;Ongoing  Verbalize understanding of diet  Time Frame: Prior to discharge  Status: Met         Francee Nodal, MS, RD, LD   [place]: [phone]  Office Phone: [phone]

## 2019-02-21 NOTE — ED Notes
Pt to ED flash via EMS for stroke activation. Pt came from [facility] rehab, where staff reports increased truncal weakness and increased slurred speech. Last known normal was 10 pm last night according to wife. Pt has hx of right and left sided weakness due to stroke, and some slurred speech as well. Pt able to answer questions, but states that he is very tired. Stroke team at the bedside. Pt A&Ox4, resp even non labored, skin WD-appropriate for age, bed in low locked position, call light in reach.     Pt belongings include heart monitor, white velcro binder, grey shorts, blue t-shirt, tennis shoes. Wife at bedside with patient.

## 2019-02-21 NOTE — Progress Notes
Rehabilitation Medicine Progress Note     Date of Service: 02/21/2019  Date of Admission: 02/04/2019  Length of Stay: 17 days    Principal Problem:    Left pontine stroke [name])  Active Problems:    Orthostatic hypotension    HTN (hypertension)    Tremors of nervous system    T2DM (type 2 diabetes mellitus) (HCC)    History of neck surgery    Gait abnormality    Right hemiparesis (HCC)    Cirrhosis (HCC)    ASSESSMENT & PLAN     Mitchell Lucero is a 68 y.o. male who is currently admitted to inpatient rehabilitation for Left pontine stroke (HCC).    Rehabilitation Plan  Rehabilitation: Patient will continue with comprehensive therapies including physical therapy, occupational therapy, speech & language pathology, specialized rehab nursing, neuropsychology and physiatry oversight.    Goals: household mobility, at ambulation level, Modified independent, Progressing  Recommended therapy after discharge: Home with Assistance, Home Health Setting  Recommended equipment: (AFO-possibly)  Tentative discharge date: 02/22/19    Daily Functional Update:  Transfers        Transfer: Assistive Device: Roller Walker (02/20/2019  9:45 AM)    Transfer: Sit to stand assist level: Stand by assistance (02/20/2019  9:45 AM)    Transfer: Stand pivot assist level: Contact guard assistance (02/20/2019  9:45 AM)    No data recorded   Gait/ Mobility        Gait: Assistive Device: Nurse, adult;Wheelchair Follow;AFO: Ankle Foot Orthosis (02/20/2019  9:45 AM)    GAIT: Assist Level: Contact guard assistance (plus spouse providing wheelchair follow for safety) (02/20/2019  9:45 AM)    Gait Distance: 150 feet (x 2) (02/20/2019  9:45 AM)    No data recorded  No data recorded   Toileting        Toileting Assist: Stand By Assist (02/16/2019  2:30 PM)    Toileting Equipment: Grab bar - left (02/05/2019  1:40 PM)    Toilet Transfer Assist: Contact guard assistance (02/17/2019  1:00 PM)     Dressing LE Dressing Assist: Stand By Assist (02/18/2019  7:15 AM) UE Dressing Assist: Stand By Assist (02/18/2019  7:15 AM)       Left pontine stroke (CVA), 12/18/2018  Encephalopathy s/p L pontine CVA  Mild Right Hemiparesis especially with fatigue and dynamic activities  Cognitive Impairment  ???????????????????????????????????????>???seen at [facility] [place], [facility]  ???????????????????????????????????????>???treated and sent to rehab, dc from rehab and had multiple falls  ???????????????????????????????????????>???worsening dysarthria, RUE weakness/involuntary movement noted on 01/31/2019  ??????????????????????????????????????????????????????????????????????????????>???transferred to [facility]  ??????????????????????????????????????????????????????????????????????????????>???Routine EEG shows mild encephalopathy, no epileptiform activity  ??????????????????????????????????????????????????????????????????????????????>???neurology ddx includes TIA vs neurodegenerative condition  - MRI brain:???with Signal abnormality without atrophy involving the left aspect of the ???pons and left middle cerebellar peduncle corresponding with his recent pontine infarct.lack of significant atrophy somewhat goes against brainstem/cerebellar neurodegenerative diseases.  ??????????????????????????????????????????????????????????????????????????????>???per neuro: significant ischemic white matter disease although underlying MSA or other Parkinsonism cannot be ruled out at this time.  ??????????????????????????????????????????????????????????????????????????????>???f/u OP with [facility] neurology/requested   ??????????????????????????????????????????????????????????????????????????????>???continue ASA, Plavix, Statin  ??????????????????????????????????????????????????????????????????????????????>???consult OT, PT, ST  ???  CARDIO:  HLD, HTN  Orthostatic Hypotension  ???????????????????????????????????????>???TTE normal EF, moderate LA dilation, no thrombus/shunt.  ???????????????????????????????????????>???continue ???recc Abd Binder and Ted Hose  ??????????????????????????????????????????????????????????????????????????????>???Goal long term BP from stroke perspective would be a little higher ideally around 110-140/70-90  ???????????????????????????????????????>???continue Midodrine, increased dose on 7/29 due to orthostatic hypotension o/n  ???????????????????????????????????????>???dc PTA flomax and Acei  ???????????????????????????????????????>???event monitor on d/c for further  stroke risk factor assessment   ???????????????????????????????????????>???f/u cardio upon DC  ???  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  ENDO:  T2DM  -PTA Levemir 10 unites QHS and metformin 500 mg BID  ???????????????????????????????????????>???hold metformin, decrease lantus to 8 units QHS 7/30 due to tight fasting glucose control, LDCF  ???  DERM:  Bilateral LEs (posterior thighs) petechial skin rash, resolved  > a/w thrombocytopenia in the setting of HCC  > Ddx TTP versus contact dermatitis vs liver disease?  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  Pain Management:???pain is well controlled, heat / ice PRN and Tylenol PRN  Bowel:???continent of bowel.  Goal 2-3 BMs per day, continue lactulose  Bladder:???incontinent of bladder at night due to urgency, see above  Nutrition:???Current diet: ???diabetic  Mental Health:???consult neuropsychology to provide support / counseling  DVT ppx: heparin (porcine) PF syringe 5,000 Units  Q8H  Code Status: Full Code      02/21/19 Changes in Care Plan  No changes in care today.  Neurology appointment scheduled for 9/9.  Nausea and vomit reported this morning after taking medications.  Continue to monitor.  Discharge plan is tomorrow with family.  Requested follow-up with rehabilitation medicine physician and primary care provider.      SUBJECTIVE/ History:     Patient was seen and examined earlier this morning.  Attending physician was present during interview and physical examination.  Wife was at bedside.  Patient denies any acute events overnight.  Denies any urinary/bladder accidents.  Reported that this morning he is not feeling so well, reported nausea and vomiting x2.  Symptoms started after he had his medications.  He does report that he feels that he is thinking is not as sharp and his processing speed is not as quick as he was.  Denies any lightheadedness no dizziness.  Does report feeling fatigue.  No other issues.  Discussed with the patient discharging tomorrow, will coordinate with social worker.  Patient has an appointment with neurology in September and follow-up appointment with hepatology tomorrow.    OBJECTIVE/ Physical Exam:     Vitals:    02/19/19 1850 02/20/19 0535 02/20/19 1730 02/21/19 0544   BP: 136/75 137/85 (!) 147/78 134/75   BP Source: Arm, Right Upper Arm, Right Upper Arm, Right Upper Arm, Right Upper   Pulse: 87 84 87 81   Temp: 37 ???C (98.6 ???F) 36.7 ???C (98.1 ???F) 37 ???C (98.6 ???F) 36.7 ???C (98.1 ???F)   SpO2: 98% 94% 97% 98%   Weight:    72.2 kg (159 lb 2.8 oz)   Height:         Vitals:    02/13/19 0655 02/14/19 0603 02/21/19 0544   Weight: 71.9 kg (158 lb 9.6 oz) 71.9 kg (158 lb 9.6 oz) 72.2 kg (159 lb 2.8 oz)     Upon physical examination the patient is alert, oriented, calm and cooperative.  Bilateral TED  hose in place, abdominal binder in place.  Conjugate ocular motions, normal effort of respiration without supplemental oxygen.  No tenderness to palpation of the abdomen, soft, no guarding.  Speech is clear, appropriate response to questions.  Negative pronator drift.  Stable neurological examination with no focal worsening of motor strength.  He does appear to be more fatigued today than he was over the weekend.  He is processing to answer questions.  A little slower, however his orientation is intact, he follows commands properly, able to recognize objects and her functions.    RESULTS AND DATA REVIEWED:     Last Bowel Movement Date: 02/21/19 (02/21/2019  7:50 AM) Current Medication List:  Scheduled Meds:aspirin chewable tablet 81 mg, 81 mg, Oral, QDAY  atorvastatin (LIPITOR) tablet 80 mg, 80 mg, Oral, QHS  bimatoprost (LUMIGAN) 0.03 % (+) ophthalmic solution 1 drop (Patien'ts Own Med), 1 drop, Both Eyes, QDAY(21)  citalopram (CeleXA) tablet 20 mg, 20 mg, Oral, QDAY  clopiDOGrel (PLAVIX) tablet 75 mg, 75 mg, Oral, QDAY  docusate (COLACE) capsule 100 mg, 100 mg, Oral, BID  heparin (porcine) PF syringe 5,000 Units, 5,000 Units, Subcutaneous, Q8H  insulin aspart U-100 (NOVOLOG FLEXPEN) injection PEN 0-6 Units, 0-6 Units, Subcutaneous, ACHS (22)  insulin glargine (LANTUS SOLOSTAR) injection PEN 8 Units, 8 Units, Subcutaneous, QHS  lactulose oral solution 20 g, 30 mL, Oral, TID  melatonin tablet 3 mg, 3 mg, Oral, QHS  midodrine (PROAMATINE) tablet 2.5 mg, 2.5 mg, Oral, TID (02-23-16)  pantoprazole DR (PROTONIX) tablet 20 mg, 20 mg, Oral, QDAY(21)  polyethylene glycol 3350 (MIRALAX) packet 17 g, 1 packet, Oral, QDAY  senna (SENOKOT) tablet 2 tablet, 2 tablet, Oral, QHS  vitamin A & D topical ointment, , Topical, BID    Continuous Infusions:  PRN and Respiratory Meds:acetaminophen Q4H PRN, aluminum/magnesium hydroxide Q4H PRN, milk of magnesia (CONC) Q4H PRN, ondansetron Q6H PRN    Results for orders placed or performed during the hospital encounter of 02/04/19 (from the past 24 hour(s))   POC GLUCOSE    Collection Time: 02/20/19 12:21 PM   # # Low-High    Glucose, POC 224 (H) 70 - 100 MG/DL   POC GLUCOSE    Collection Time: 02/20/19  5:25 PM   # # Low-High    Glucose, POC 182 (H) 70 - 100 MG/DL   POC GLUCOSE    Collection Time: 02/20/19  9:53 PM   # # Low-High    Glucose, POC 209 (H) 70 - 100 MG/DL   POC GLUCOSE    Collection Time: 02/21/19  6:46 AM   # # Low-High    Glucose, POC 130 (H) 70 - 100 MG/DL   CBC CELLULAR THERAPEUTICS    Collection Time: 02/21/19  8:16 AM   # # Low-High    White Blood Cells 6.8 4.5 - 11.0 K/UL    RBC 3.94 (L) 4.4 - 5.5 M/UL Hemoglobin 12.1 (L) 13.5 - 16.5 GM/DL    Hematocrit 16.1 (L) 40 - 50 %    MCV 90.0 80 - 100 FL    MCH 30.7 26 - 34 PG    MCHC 34.1 32.0 - 36.0 G/DL    RDW 09.6 (H) 11 - 15 %    Platelet Count 146 (L) 150 - 400 K/UL    MPV 7.6 7 - 11 FL   BASIC METABOLIC PANEL CELLULAR THERAPEUTICS    Collection Time: 02/21/19  8:16 AM   # # Low-High    Sodium 137 137 -  147 MMOL/L    Potassium 3.9 3.5 - 5.1 MMOL/L    Chloride 103 98 - 110 MMOL/L    CO2 23 21 - 30 MMOL/L    Anion Gap 11 3 - 12    Glucose 137 (H) 70 - 100 MG/DL    Blood Urea Nitrogen 13 7 - 25 MG/DL    Creatinine 1.61 0.4 - 1.24 MG/DL    Calcium 8.7 8.5 - 09.6 MG/DL    eGFR Non African American >60 >60 mL/min    eGFR African American >60 >60 mL/min   POC GLUCOSE    Collection Time: 02/21/19 11:50 AM   # # Low-High    Glucose, POC 214 (H) 70 - 100 MG/DL     Chart reviewed, nursing notes and therapy notes reviewed.   Case discussed with the attending physician.     Gareth Morgan, M.D.   Physical Medicine and Rehabilitation (PM&R)   Resident Physician, PGY-2   Pager: [phone]  02/21/2019   12:15 PM

## 2019-02-21 NOTE — Acute Stroke Response
NAME:Mitchell Lucero             MRN: [id number]             DOB:03-25-1951          AGE: 68 y.o.  ADMISSION DATE: 02/21/2019             DAYS ADMITTED: LOS: 0 days    Date of Service:  02/21/2019    Allergies:  Patient has no known allergies.    Type of Acute Stroke Response Team note: Consult       Assessment & Plan     Chief Complaint: Dysarthria, generalized weakness  Assessment: Mitchell Lucero is a 68 y.o. male with a PMH of prior L lateral pons/cerebellar peduncle stroke, HTN, DMII who was woke up this morning at [facility] with worsening speech and generalized weakness. LKW was bedtime on 02/20/2019. NIHSS was 5 on presentation for LOC, dysarthria, BLE drift, and RUE ataxia. CT H was unremarkable for acute changes. 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 CTP showed mismatch localizing to R superior cerebellar artery. Patient with improvement of symptoms after imaging. States he has experienced these symptoms before.    Impression: Likely recrudescence of prior stroke symptoms in setting of potential underlying toxic-metabolic abnormality. No acute stroke intervention warranted.     Suspected etiology: Stroke mimic/recrudescence    Pre-event mRS: 4 - Moderately severe disability; unable to walk without assistance and unable to attend to own bodily needs without assistance     Plan:   - No acute stroke intervention  - Further toxic metabolic evaluation per ED   -Consider checking ammonia  - If symptoms persist, can consider MRI brain WO to assess stability of prior stroke burden    The patient was discussed with Dr. Avelino Leeds, MD  Neurology, PGY-3    History of Present Ilness       History of Present Illness:     Mitchell Lucero is a 68 y.o. male with a PMH of prior L lateral pons/cerebellar peduncle stroke, HTN, DMII who was woke up this morning at [facility] with worsening speech and generalized weakness. LKW was bedtime on 02/20/2019. Patient states he has experienced these symptoms before with his initial stroke this summer. He states he just feels extremely tired in conjunction with the neurologic deficits. He denies any fever, dysuria, abdominal pain.      NIHSS was 5 on presentation for LOC, dysarthria, BLE drift, and RUE ataxia. CT H was unremarkable for acute changes. 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 CTP showed mismatch localizing to R superior cerebellar artery. Patient with improvement of symptoms after imaging. States he has experienced these symptoms before.      Review of Systems     A 14 point review of systems was negative except for: generalized weakness, fatigue      Stroke Activation Summary     Patient Arrival: 1504  ASRT Arrival: 1507  Location of Response : ED   Page Received: 1500    Clinical Presentation: Dysarthria, Decreased LOC, Right sided weakness, Left sided weakness    Signs & Symptoms: Last Known Well  Last Known Well -  Date: 02/21/19  Last Known Well - Time: 2230     CT/CTP/CTA:     CTA head:    1. ???No acute intracranial hemorrhage or mass effect.  2. ???No large vessel occlusion. Persistent multifocal atherosclerotic   stenoses of the left ICA and left M1 middle cerebral artery.  3. ???Unchanged focal occlusions or severe stenoses involving bilateral   superior cerebellar arteries and the P2 segment of the right posterior   cerebral artery.  4. ???Mild and moderate atherosclerotic stenoses of the vertebral arteries.  5. ???Persistent moderate cerebral white matter chronic microvascular   ischemic change.  6. ???Persistent hyperdensity within the left middle cerebellar, presumably related to a prior related to prior ischemic change. Gliotic sequelae of a   prior nonspecific infarct versus appearance.    CTA neck:    Unchanged exam. 50% right ICA and less than 50% left ICA atherosclerotic   narrowing by NASCET criteria. Mild narrowing of the right vertebral artery   origin.     CT perfusion:     Mismatched perfusion localizing to the right superior cerebellar artery   territory, likely related to known severe stenosis or occlusion of the   right superior cerebellar artery.         BP: 119/75 (08/10 1537)  Temp: 37.2 ???C (98.9 ???F) (08/10 1538)  Pulse: 79 (08/10 1537)  Respirations: 23 PER MINUTE (08/10 1537)  SpO2: 99 % (08/10 1537)  SpO2 Pulse: 79 (08/10 1537)    NIHSS Completed at: 1507      NIH Stroke Scale Item  Scoring Definition  Score    1a. LOC  0=alert and responsive   1=arousable to minor stimulation   2=arousable only to painful stimulation   3=reflex responses or unrousable  1    1b. LOC questions-as patient???s age and month. Must be exact.  0=both correct   1=one correct (or dysarthria, intubated, foreign language)   2=neither correct  0    1c. Commands-open/close eyes, grip and release non-paretic hand (other 1 step commands or mimic OK)  0=both correct (ok if impaired by weakness)   1=one correct   2=neither correct  0    2. Best Gaze-horizontal EOM by voluntary or Leslie Dales???s  0=normal   1=partial gaze palsy (abnormal gaze in one or both eyes)   2=forced eye deviation or total paresis which cannot be overcome by Leslie Dales???s  0    3. Visual Field-use visual threat if necessary. If monocular, score field of good eye  0=no visual loss   1=partial hemianopia, quadrantanopia, extinction   2=complete hemianopia   3=bilateral hemianopia or blindness  0    4. Facial Palsy-if stuporous, check symmetry of grimace to pain  0=normal   1=minor paralysis, flat NLF, asymm smile   2=partial paralysis (lower face=UMN)   3=complete paralysis (upper and lower face)  0 5. Motor Arm-arms outstretched 90 deg (sitting) or 45 deg (supine) for 10 seconds. Encourage best effort.  0=no drift x 10 seconds   1=drift but doesn???t hit bed   2=some antigravity effort, but can???t sustain   3=no antigravity effort, but even minimal mvt counts   4=no movement at all   X=unable to assess due to amputation, fusion, etc  L/R   0/0    6. Motor Leg-raise leg to 30 degrees supine x 5 seconds  0=no drift x 5 seconds   1=drift but doesn???t hit bed   2=some antigravity effort, but can???t sustain   3=no antigravity effort, but even  minimal mvt counts   4=no movement at all   X=unable to assess due to amputation, fusion, etc  L/R   1/1    7. Limb Ataxia-check finger-nose- finger; heel-shin; and score only if out of proportion to paralysis  0=no ataxia (or aphasic, hemiplegic)   1=ataxia in upper or lower extremity   2=ataxia in upper AND lower extremity   X=unable to assess due to amputation, fusion, etc  1   8. Sensory-use safety pin. Check grimace or withdrawal if stuporous. Score only stroke- related losses  0=normal   1=mild-mod unilateral loss but patient aware of touch 9or aphasic, confused)   2=total loss, pt unaware of touch. Coma, bilateral loss  0   9. Best Language-describe cookie jar picture, name objects, read sentences. May use repeating, writing, stereognosis  0=normal   1=mild-mod aphasia (diff but partly comprehensible)   2=severe aphasia (almost no info exchanged)   3=mute, global aphasia, coma. No 1 step commands  0    10. Dysarthria-read list of words  0=normal   1=mild-mod; slurred but intelligible   2=severe; unintelligible or mute  1    11. Extinction/Neglect- simultaneously touch patient on both hands, show fingers in both visual fields, ask about deficit, left hand  0=normal, none detected. (visual loss alone)   1=neglects or extinguishes to double simult stimulation in any modality   2=profound neglect in more than one modality  0    Score    5       Was IV tPA given? No The patient was not a tPA candidate due to outside window    Advanced imaging was interpreted at 1610,9604, 1531  The patient was not a thrombectomy candidate due to no evidence of LVO     Dysphagia screen:  Did Patient Pass The Swallow Screen Part I?: No  If No Name of Physician Notified: Dr. Celene Kras                Performed by nursing staff and Failed screen by nursing staff and awaiting ST evaluation            Health History     Medical History:   Diagnosis Date   ??? DM (diabetes mellitus) (HCC)    ??? Hypertension    ??? TIA (transient ischemic attack)    ??? Tremors of nervous system    ??? Weight loss      Surgical History:   Procedure Laterality Date   ??? HX BACK SURGERY      C6-7 ACDF (cervical)     Family History   Family history unknown: Yes     Social History     Socioeconomic History   ??? Marital status: Married     Spouse name: [name]   ??? Number of children: 3   ??? Years of education: Not on file   ??? Highest education level: Not on file   Occupational History   ??? Not on file   Tobacco Use   ??? Smoking status: Never Smoker   ??? Smokeless tobacco: Former Neurosurgeon     Types: Chew   Substance and Sexual Activity   ??? Alcohol use: Yes     Comment: occasional   ??? Drug use: Never   ??? Sexual activity: Not on file   Other Topics Concern   ??? Not on file   Social History Narrative    - Anterior cervical discectomy and fusion in 1997    - Used to work Therapist, nutritional  prior to retiring        Medications:      PRN Medications:         Physical Exam     HEENT: normocephalic, eyes closed, nares patent, oropharynx is clear with no lesions, palate intact  CV: regular rate  Chest: normal configuration, equal chest rise bilaterally  Ab: soft, non-tender, no masses, no organomegaly  Skin: no rashes or lesions    Extended Neuro Exam:    Mental status: alert and interactive  Speech:    Normal Abnormal   Fluency x    Comprehension x    Articulation  Mild dysarthria   Repetition x    Naming x        Cranial Nerves:    Normal Abnormal II x    III, IV, VI x    V x    VII                         x                             VIII x    IX, X x    XI x    XII x        Muscle/motor:   Tone: nml  Bulk: nml  Fasciculations: none     NF NE SA EF EE WE WF FF FE FA TA HF HA HE KF KE DF PF   R   5 5 5   5    4   4 4      L   5 5 5   5    4   4 4          Sensation:    Normal RUE LUE RLE LLE   Light Touch x       Pin Prick        Temperature        Vibration        Proprioception            Coordination:    Normal Abnormal Right Abnormal Left   Finger to Nose  x    Rapid alternating       Heel to Shin x     Finger tap      Foot tap        Gait and Sation:  Regular gait: not assessed       Lab/Radiology/Other Diagnostic Tests:  24-hour labs:    Results for orders placed or performed during the hospital encounter of 02/21/19 (from the past 24 hour(s))   POC GLUCOSE    Collection Time: 02/21/19  3:07 PM   Result Value Ref Range    Glucose, POC 209 (H) 70 - 100 MG/DL   POC PT/INR    Collection Time: 02/21/19  3:07 PM   Result Value Ref Range    INR POC 1.0 0.8 - 1.2   POC TROPONIN    Collection Time: 02/21/19  3:08 PM   Result Value Ref Range    Troponin-I-POC 0.00 0.00 - 0.05 NG/ML   POC CREATININE, RAD    Collection Time: 02/21/19  3:10 PM   Result Value Ref Range    Creatinine, POC 0.7 0.4 - 1.24 MG/DL   CBC AND DIFF    Collection Time: 02/21/19  3:32 PM   Result Value Ref Range    White Blood Cells 6.6  4.5 - 11.0 K/UL    RBC 3.77 (L) 4.4 - 5.5 M/UL    Hemoglobin 11.6 (L) 13.5 - 16.5 GM/DL    Hematocrit 60.4 (L) 40 - 50 %    MCV 90.4 80 - 100 FL    MCH 30.7 26 - 34 PG    MCHC 34.0 32.0 - 36.0 G/DL    RDW 54.0 (H) 11 - 15 %    Platelet Count 144 (L) 150 - 400 K/UL    MPV 7.6 7 - 11 FL    Neutrophils 55 41 - 77 %    Lymphocytes 30 24 - 44 %    Monocytes 10 4 - 12 %    Eosinophils 4 0 - 5 %    Basophils 1 0 - 2 %    Absolute Neutrophil Count 3.66 1.8 - 7.0 K/UL    Absolute Lymph Count 2.00 1.0 - 4.8 K/UL    Absolute Monocyte Count 0.63 0 - 0.80 K/UL Absolute Eosinophil Count 0.29 0 - 0.45 K/UL    Absolute Basophil Count 0.05 0 - 0.20 K/UL   BASIC METABOLIC PANEL    Collection Time: 02/21/19  3:32 PM   Result Value Ref Range    Sodium 136 (L) 137 - 147 MMOL/L    Potassium 3.8 3.5 - 5.1 MMOL/L    Chloride 102 98 - 110 MMOL/L    CO2 28 21 - 30 MMOL/L    Anion Gap 6 3 - 12    Glucose 220 (H) 70 - 100 MG/DL    Blood Urea Nitrogen 16 7 - 25 MG/DL    Creatinine 9.81 0.4 - 1.24 MG/DL    Calcium 8.6 8.5 - 19.1 MG/DL    eGFR Non African American >60 >60 mL/min    eGFR African American >60 >60 mL/min     POC Glucose (Download): (!) 209 (02/21/19 1507)  Pertinent radiology reviewed.

## 2019-02-21 NOTE — Progress Notes
PHYSICAL THERAPY     02/21/19 1400   Subjective   Subjective Upon arrival, patient presented asleep in the bedside chair. He declines therapy due to feeling too tired & somewhat sick to his stomack. I notified the RN & resident that I had not seen him this lethargic, and he appeared to be declining.  I notified the resident, Dr. Queen Blossom & his bedside nurse, Dhruv.    Bed Mobility   Bed Mobility: Sit to Supine Assist Level Maximum assistance;Assist of two   Bed Mobility: Sit to supine type of assist With head of bed elevated   Transfers   Transfer: Assistive Device Roller Walker   Transfer: Sit to stand assist level Minimum assistance;With two person   Transfer: Sit to stand type of assist Facilitation of trunk;Facilitation of weight shift   Transfer: Stand to sit assist level Moderate assistance;With one person   Transfer: Stand to sit type of assist For safety   Transfer: Stand pivot assist level Maximum assistance;With two person   Transfer Comment Patient was transferred with nursing assist to bed. He demonstrated a R side lean, more assist needed to remain in midline & was total assist to pivot to the bed. Patient was unable to unweight either foot. He demonstrated a R side lean in sitting and needed support. Pt was immediately transferred to supine with HOB elevated. Upon being positioned, he had a bout of emesis (69ml).    Assessment   Assessment Upon arrival back to bed, Dr. Queen Blossom was there to evaluate. I assisted the patient to sitting one addiitonal instance, where her continued to demonstrate a R side trunk drift. Pt was left with nursing & physician care for stroke activation.      Doren Custard, PT, DPT

## 2019-02-21 NOTE — Progress Notes
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      He was recently admitted to [facility] in [place]. Mitchell Lucero June of this year and was found to have a left pontine stroke and discharged to rehab.  In July he had a transient episode of worsening dysarthria and shaking of his RUE and b/l LE and was transferred to [facility] for further evaluation.     CTA showed diffuse atherosclerosis with high grade stenosis at P2 and b/l distal vertebral arteries, SCA, and L M1. 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  MRI  showed signal abnormality of the left pons and left middle cerebral peduncle, consistent with known prior infarct, chronic whiter diseaes but no acute stroke or findings consistent with neurodegenerative condition.  MRI C spine showed prior ACDF at C6-C7 without high grade central or foraminal stenosis, hyperdense signal at C7 with associated volume loss compatible with myelomalacia, and multilevel central stenosis.  EEG with intermittent generalized slowing, but no epileptiform discharges or seizures.   Pt was noted to have significant orthostatic hypotension.  It was felt that pt's presentation was secondary to limb shaking TIA secondary to posterior circulation atherosclerosis and severe vessel disease in the setting of orthostatic hypotension with resultant hypoperfusion.  He was started on midrodrine and provided abdominal binders/compression stockings.  During his hospital stay he was also found to have hepatocellular carcinoma with elevated ammonia at 130.   He was discharged to Rehab on 7/20. This morning, he woke up feeling more fatigued and had 2x episodes of nausea and vomiting.  He reported feeling that his thinking was foggy and had delayed processing.  Labs checked showed Hgb of 12.1 with slightly low plts at 146.  BMP unremarkable.  When rechecked later around 1400, he was noted to have worsening fatigue, generalized weakness, worsening dysarthria and tremors.  He was transferred back to [facility] ED for further evaluation.    NIH-SS upon arrival 5 for decreased LOC, dysarthria, b/l LE weakness, and ataxia. BP 105/64.   CT head negative for acute process.  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  CTP with mismatched perfusion of the R superior cerebellar artery likely chronic, secondary to known stenosis of the SCAs.      Impression: Likely recrudescence of previous stroke symptoms 2/2 to toxic/metabolic vs infection vs cerebral hypoperfusion (h/o of orthostatic hypotension with similar presentation during last admission) in the setting of severe vascular disease. CT/CTA/P shows severe vessel disease, worse on right, consistent with prior imaging. Pt is not a candidate for tPA due to delayed presentation and is not a candidate for mechanical thrombectomy due to absence of LVO.   Given pt's history of HCC with hyperammonemia and reported worsening somnolence with n/v over the past day, consider checking liver function and ammonia level. Agree with further evaluation for other toxic/metabolic and infectious causes.   It is also possible his presentation is also secondary to global cerebral hypoperfusion given his history  of severe vessel disease and borderline BP upon presentation.  Can consider giving IVF bolus with goal of maintaining SBP > 120.      Recommendations: -Consider obtaining liver enzymes and ammonia level  -Consider IVF bolus with goal SBP >120  -Agree with evaluation of other toxic/metabolic and infectious causes per ED  -If symptoms persist and no clear etiology is found, can consider MRI brain w/out contrast for further evaluation.       Mitchell Kaiser, DO  Vascular Fellow  Pager [phone]

## 2019-02-21 NOTE — Progress Notes
OCCUPATIONAL THERAPY     02/21/19 0715   Subjective   Subjective Patient reporting not feeling well this morning due to upset stomach.   He reported that he vomited prior to OT session.  Patient requested to rest.  Spouse was present for family training . Reviewed patient's progress and functional perfomance with spouse.  Rescheduled OT session for 1115 to demonstrate patient's performance with ADL's.    Session Information   Missed Minutes 60 Minutes   Reasons for Missed Minutes Nausea / sick     Molli Knock, OTR/L

## 2019-02-21 NOTE — Acute Stroke Response
RN Stroke Activation Summary       Date of Service:  02/21/2019  Mitchell Lucero is a 68 y.o.  male.     DOB: 08/05/1950                  MRN#:  [id number]  Allergies:  Patient has no known allergies.    Patient Arrival: 37  ASRT Arrival: 1507  Location of Response : ED    Page Received: 1500  EMS Agency: [facility]       Clinical Presentation: Dysarthria, Decreased LOC, Right sided weakness, Left sided weakness    Total Stroke Scale Score: 5    Signs & Symptoms: Last Known Well  Last Known Well - Date: 02/21/19  Last Known Well - Time: 2230     Dysphagia screen:  Did Patient Pass The Swallow Screen Part I?: No  If No Name of Physician Notified: Dr. Celene Kras                  Assessment & Plan     Summary:  Patient presented to ED via EMS from [facility] with increased weakness and dysarthria. NIHSS 5. CT/CTA/CTP of head completed. No acute stroke intervention at this time.      Radiology/Interventional Delay  Decision to IR: No    CT of head results called to neurology stroke by radiology at 1516  CTA/P of head results called to neurology stroke by radiology at 1528 and 1533    No current facility-administered medications for this encounter.     N/A      Plan: plan of care per ED    Call Completion: 1550    RN handoff: Lequita Halt    History of Present Illness     Medical History:   Diagnosis Date   ??? DM (diabetes mellitus) (HCC)    ??? Hypertension    ??? TIA (transient ischemic attack)    ??? Tremors of nervous system    ??? Weight loss      Surgical History:   Procedure Laterality Date   ??? HX BACK SURGERY      C6-7 ACDF (cervical)     Social History     Socioeconomic History   ??? Marital status: Married     Spouse name: [name]   ??? Number of children: 3   ??? Years of education: Not on file   ??? Highest education level: Not on file   Occupational History   ??? Not on file   Tobacco Use   ??? Smoking status: Never Smoker   ??? Smokeless tobacco: Former Neurosurgeon     Types: Chew   Substance and Sexual Activity   ??? Alcohol use: Yes Comment: occasional   ??? Drug use: Never   ??? Sexual activity: Not on file   Other Topics Concern   ??? Not on file   Social History Narrative    - Anterior cervical discectomy and fusion in 1997    - Used to work building metal tanks prior to retiring         Berna Bue, RN

## 2019-02-21 NOTE — Discharge Instructions - Pharmacy
Physician Discharge Summary    Name: Mitchell Lucero  Medical Record Number: [id number]        Account Number:  [id number]  Date Of Birth:  1951/01/24                         Age:  68 years   Admit date:  02/04/2019                     Discharge date:  02/21/2019    Attending Physician:  Chase Picket, MD         Service: Rehab Medicine    Physician Summary completed by: Faaris Arizpe Jenetta Loges, MD     Reason for hospitalization:   1. Left pontine stroke (CVA), late effects below    Significant PMH:   Medical History:   Diagnosis Date   ??? DM (diabetes mellitus) (HCC)    ??? Hypertension    ??? TIA (transient ischemic attack)    ??? Tremors of nervous system    ??? Weight loss          Allergies: Patient has no known allergies.    Admission Physical Exam notable for:    MSK: He has purposeful movements of all extremities. There is some involuntary very occasional twitch of his LEs. His LUE is slightly flexed with hand flexed and finger curled. Overall BLE strenght is 4/5, some LUE weakness noted 4-/5 and RUE 4/5. Some increased tone of BLE.   ???  ??? Root Right Left   Shoulder Abduction C5 4 4-   Elbow Flexion C5 4 4-   Elbow Extension C7 4 4-   Wrist Extension C6 4 4-   Finger Flexion C8 4 4-   Finger Abduction T1 4 4   Hip Flexion L2 4 4   Knee Flexion L5/S1 4 4   Knee Extension L3 4 4   Dorsiflexion L4 4 4   Plantarflexion S1 4 4   EHL Extension L5 4 4   Neuro:  Cranial Nerves Cranial Nerves 2-12 are grossly intact   DTR's Symmetrical +2   Babinski Plantar Reflex is Downgoing Bilaterally   Hoffman Normal   Finger to Nose Slow processing with FTN, tremor with dysmetria noted of LUE   Heel to Shin Normal   Rapid Alternating Movements Normal   Upper Extremity Tone Normal   Lower Extremity Tone Slightly increased    Upper Extremity Sensation Intact to light touch bilaterally   Lower Extremity Sensation Intact to light touch bilaterally   Clonus Negative Bilaterally   Proprioception Intact Bilaterally Memory/Cognition/Speech Delayed processing speed. Clear speech, good 3 word recall short term, decreased attention span, impaired calculation, but able to identify objects and functions   ???  ???  Admission Lab/Radiology studies notable for:   Basic Metabolic Profile ???         Lab Results   Component Value Date/Time   ??? NA 140 02/04/2019 06:05 AM   ??? K 3.7 02/04/2019 06:05 AM   ??? CA 8.7 02/04/2019 06:05 AM   ??? CL 107 02/04/2019 06:05 AM   ??? CO2 26 02/04/2019 06:05 AM   ???       Lab Results   Component Value Date/Time   ??? BUN 18 02/04/2019 06:05 AM   ??? CR 0.69 02/04/2019 06:05 AM   ??? GLU 120 (H) 02/04/2019 06:05 AM   ???   CBC w diff ???  Lab Results   Component Value Date/Time   ??? WBC 6.0 02/04/2019 06:05 AM   ??? RBC 3.70 (L) 02/04/2019 06:05 AM   ??? HGB 11.5 (L) 02/04/2019 06:05 AM   ??? HCT 33.0 (L) 02/04/2019 06:05 AM   ??? MCV 89.2 02/04/2019 06:05 AM   ??? MCH 31.1 02/04/2019 06:05 AM   ??? RDW 15.6 (H) 02/04/2019 06:05 AM   ??? PLTCT 97 (L) 02/04/2019 06:05 AM   ??? MPV 8.0 02/04/2019 06:05 AM   ??? No results found for: NEUT, ANC, LYMA, ALC, MONA, AMC, EOSA, AEC, BASA, ABC   ???  Radiology:    Reviewed.  ???  Mri Head Wo Contrast  ???  Result Date: 02/01/2019  EXAM: MRI BRAIN AND C-SPINE HISTORY: Gait disorder. TECHNIQUE: Multiplanar and multisequence MR imaging of the head and C-spine was performed. This was done without contrast. COMPARISON: Same-day CTA head/neck. FINDINGS: Dr. Dimple Casey, M.D. has personally reviewed these images and formulated the interpretations and opinions expressed in this report. Brain: There is ill-defined FLAIR hyperintensity and elevated diffusion-weighted signal without restriction involving the left aspect of the pons and left middle cerebellar peduncle is noted. Per review of the medical record the patient had a acute pontine infarct which occurred in June 2020, though the images from that exam are not available for comparison. There are a few tiny low signal intensity foci of susceptibility in the left aspect of the pons in the region of this infarct, likely hemosiderin deposition relating to petechial type hemorrhage. There is no significant atrophy. There are underlying patchy T2 hyperintensities within the center and right pons as well. Moderate patchy and confluent supratentorial white matter FLAIR hyperintensities. Patchy T2 hyperintense signal in the midbrain, pons, and right cerebellar hemispheric white matter also noted. There are several perivascular spaces which are prominent in the basal ganglia bilaterally. The ventricles and subarachnoid spaces are normal in size and configuration for age. There is no midline shift or mass effect. The central arterial vascular flow-voids are preserved, however, there were areas of atherosclerotic calcification which are better evaluated on the same day CTA of the head. There is mild mastoid fluid bilaterally. The paranasal sinuses appear clear. Globes and orbits are unremarkable apart from prior lens surgery. C-spine: Prior ACDF at C6-C7. Artifact related to indwelling hardware slightly limits evaluation. Exam is also slightly limited due to motion degradation. Straightened cervical lordosis. Normal alignment apart from trace degenerative anterolisthesis at C7-T1. Vertebral body heights are maintained. Mild heterogeneous marrow signal without a discrete osseous lesion. Mild multilevel disc degeneration characterized by loss of disc height and signal. Focal T2 hyperintense signal abnormality measuring approximately 1.0 cm is present centered at the C7 vertebral body level with associated mild volume loss of the cervical cord at this location. No obvious additional area of cord signal abnormality, though evaluation is significantly limited due to motion. Level by level analysis as below: C2-C3: Posterior disc osteophyte complex, uncovertebral and facet hypertrophy results in up to mild central stenosis and up to mild left neural foraminal narrowing. C3-C4: Posterior disc osteophyte complex, uncovertebral and facet hypertrophy results in up to mild central stenosis as well as at least moderate bilateral neural foraminal narrowing. C4-C5: Posterior disc osteophyte complex, uncovertebral and facet hypertrophy results in at least mild central stenosis as well as at least moderate bilateral neural foraminal narrowing. C5-C6: Limited evaluation due to metallic susceptibility artifact. There appears to be a posterior disc osteophyte complex which results in at least moderate central spinal stenosis.  Additionally, there is likely at least moderate bilateral neural foraminal narrowing at this level. C6-C7: Prior ACDF. No obvious central spinal stenosis or neural foraminal narrowing at this level. C7-T1: No significant stenosis. Paraspinous soft tissues are unremarkable.   ???  Brain: 1. ???Signal abnormality without atrophy involving the left aspect of the pons and left middle cerebellar peduncle. This likely corresponds with the reported recent infarct in June 2020 (per review of the medical record), though these images are not available for comparison. The lack of significant atrophy somewhat goes against brainstem/cerebellar neurodegenerative diseases. Subacute nonspecific demyelination in a post infectious/inflammatory setting or other infectious/inflammatory encephalitides could produce this appearance. Comparison with prior available films would be of benefit. 2. ???Moderate chronic small vessel ischemic changes involving the cerebral white matter and pons and tiny old right cerebellar infarct. C-spine: 1. ???Limited evaluation due to metallic susceptibility artifact and motion degradation. 2. ???Prior ACDF at C6-C7 without high-grade central or neural foraminal stenosis at the operated level. 3. ???Focal T2 hyperintense signal abnormality within the cervical cord at the C7 vertebral level with associated volume loss, compatible with myelomalacia. No obvious additional area of cord signal abnormality or acute edema, though evaluation is limited as above. 4. ???Multilevel central spinal stenosis, most pronounced of suspected at least moderate degree at C4-C5. 5. ???Multilevel degenerative neural foraminal narrowing, of likely at least moderate degree at several levels, as detailed. By my electronic signature, I attest that I have personally reviewed the images for this examination and formulated the interpretations and opinions expressed in this report ???Finalized by Dimple Casey, M.D. on 02/01/2019 9:34 AM. Dictated by Elijah Birk, M.D. on 02/01/2019 8:00 AM.   ???  Cta Head Wo/w Contr+post Pro  ???  Addendum Date: 02/01/2019 ???  ???Finalized by Marily Memos, M.D. on 02/01/2019 9:51 AM. Dictated by Delfin Gant, M.D. on 02/01/2019 8:19 AM.Addendum: Delfin Gant, M.D. discussed these findings with Dr. Geanie Cooley by telephone 02/01/2019 10:05 AM ???Approved by Delfin Gant, M.D. on 02/01/2019 10:05 AM By my electronic signature, I attest that I have personally reviewed the images for this examination and formulated the interpretations and opinions expressed in this report ???Finalized by Marily Memos, M.D. on 02/01/2019 10:17 AM. Dictated by Delfin Gant, M.D. on 02/01/2019 10:05 AM.   ???  Result Date: 02/01/2019  ???EXAM: CTA HEAD AND NECK HISTORY: 68 year old male, Stroke workup, slurred speech and weakness, repeat falls, recent left pontine stroke TECHNIQUE: Multiple contiguous axial images were obtained of the brain and neck following the administration of Omnipaque 350 IV contrast. Precontrast axial images were also obtained of the brain. CTA maximum density projection images were obtained of the brain and neck with image post processing. ???Comparison: Same-day MRI of brain FINDINGS: CTA head: The ventricles and subarachnoid spaces are normal in size and configuration. The moderate patchy supratentorial white matter hypodensities. Focal hypodensity within the left middle cerebellar peduncle and left pons, concordant with the pontine and middle cerebellar peduncle prior infarct demonstrated on the MRI performed shortly before this examination. There is no midline shift or mass effect. There is no evidence of acute intracranial hemorrhage. The basal cisterns are patent. The calvarium is intact. Prior bilateral ocular lens surgery. Moderate to marked peripheral calcification within the cavernous carotids. There is at least mild narrowing of the peri and supraclinoid left ICA as well as mild stenosis within the right supraclinoid and left cavernous ICA. The right A1 segment is hypoplastic with a dominant widely patent left A1 segment. This is likely developmental. There is  mild narrowing at the left M1 origin. The anterior and middle cerebral arteries are otherwise patent without occlusion or high-grade stenosis. Posteriorly, there is dense circumferential peripheral calcification within the segments. The dense degree of calcification limits evaluation for luminal stenosis. There could be moderate or high-grade stenosis. There is a small amount of basilar artery calcification without significant stenosis. There are patent bilateral height is. There are several very small caliber AICA is noted. There are severe stenoses or occlusions of the origins of the superior cerebellar arteries bilaterally. These vessels are patent several millimeters beyond their origins (coronal 44 through 49 of series 603) The left posterior cerebral artery is patent with occlusion or stenosis. Within the right P2 segment areas short segment area of marked stenosis or occlusion (series 3, images 400-408). 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 Atherosclerotic calcification of the origin of the arch vessels without significant stenosis. Atherosclerotic calcification at the origin of the right vertebral artery origin resulting in at least mild stenosis. Mixed, though predominantly soft plaque atherosclerosis at the right ICA origin resulting in less than 50% stenosis by NASCET criteria. Atherosclerotic calcification at the left carotid bifurcation and proximal left internal carotid artery without stenosis by NASCET criteria. No aneurysm, AVM, or dissection is identified. Prior ACDF of C6-C7. Cervical spondylosis with multilevel neuroforaminal stenosis to marked degree at C3-C4, C4-C5 bilaterally, and C5-6 on the right. Multilevel mild degenerative central spinal stenosis. Dental and periodontal disease with scattered dental caries and periapical lucencies at teeth numbers teeth #1, at, 24, 25, and 26 The mastoid air cells are clear. Minimal right maxillary mucosal thickening. Coronary artery calcification and left lung calcified granulomas.   ???  CTA head: 1. ???No acute intracranial hemorrhage or mass effect. 2. ???Dense cavernous carotid calcification. The degree of calcification limits luminal narrowing estimation though there are areas that appear to be at least mildly narrowed. 3. ???Short segment occlusion or high-grade stenosis of the right P2 arterial segment. 4. ???Dense atherosclerotic calcification within the V4 segment segments. The degree of dense calcification limits luminal narrowing estimation. There could be moderate or high-grade stenoses 5. ???Severe stenoses or occlusions of the origins of the superior cerebellar arteries bilaterally. The vessels are patent more distally. 6. ???Mild stenosis of the left M1 origin 7. Hypodensity within the left pons and left middle cerebellar peduncle, concordant with the the probable prior infarct on the MRI performed shortly before this exam. 8. Moderate additional patchy supratentorial white matter hypodensities, likely secondary to chronic microvascular ischemic changes. CTA neck: 1. 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 No significant left carotid stenosis by NASCET criteria. 2. ???Atherosclerotic calcification at the right vertebral artery origin resulting in at least mild stenosis. 3. ???Cervical spondylosis with up to marked multilevel neuroforaminal stenosis and multilevel mild central spinal stenosis. 4. ???Dental and periodontal disease as above. 5. Coronary artery calcification By my electronic signature, I attest that I have personally reviewed the images for this examination and formulated the interpretations and opinions expressed in this report   ???  Cta Neck Wo/w Contrast+post P  ???  Addendum Date: 02/01/2019 ???  ???Finalized by Marily Memos, M.D. on 02/01/2019 9:51 AM. Dictated by Delfin Gant, M.D. on 02/01/2019 8:19 AM.Addendum: Delfin Gant, M.D. discussed these findings with Dr. Geanie Cooley by telephone 02/01/2019 10:05 AM ???Approved by Delfin Gant, M.D. on 02/01/2019  10:05 AM By my electronic signature, I attest that I have personally reviewed the images for this examination and formulated the interpretations and opinions expressed in this report ???Finalized by Marily Memos, M.D. on 02/01/2019 10:17 AM. Dictated by Delfin Gant, M.D. on 02/01/2019 10:05 AM.   ???  Result Date: 02/01/2019  ???EXAM: CTA HEAD AND NECK HISTORY: 68 year old male, Stroke workup, slurred speech and weakness, repeat falls, recent left pontine stroke TECHNIQUE: Multiple contiguous axial images were obtained of the brain and neck following the administration of Omnipaque 350 IV contrast. Precontrast axial images were also obtained of the brain. CTA maximum density projection images were obtained of the brain and neck with image post processing. ???Comparison: Same-day MRI of brain FINDINGS: CTA head: The ventricles and subarachnoid spaces are normal in size and configuration. The moderate patchy supratentorial white matter hypodensities. Focal hypodensity within the left middle cerebellar peduncle and left pons, concordant with the pontine and middle cerebellar peduncle prior infarct demonstrated on the MRI performed shortly before this examination. There is no midline shift or mass effect. There is no evidence of acute intracranial hemorrhage. The basal cisterns are patent. The calvarium is intact. Prior bilateral ocular lens surgery. Moderate to marked peripheral calcification within the cavernous carotids. There is at least mild narrowing of the peri and supraclinoid left ICA as well as mild stenosis within the right supraclinoid and left cavernous ICA. The right A1 segment is hypoplastic with a dominant widely patent left A1 segment. This is likely developmental. There is mild narrowing at the left M1 origin. The anterior and middle cerebral arteries are otherwise patent without occlusion or high-grade stenosis. Posteriorly, there is dense circumferential peripheral calcification within the segments. The dense degree of calcification limits evaluation for luminal stenosis. There could be moderate or high-grade stenosis. There is a small amount of basilar artery calcification without significant stenosis. There are patent bilateral height is. There are several very small caliber AICA is noted. There are severe stenoses or occlusions of the origins of the superior cerebellar arteries bilaterally. These vessels are patent several millimeters beyond their origins (coronal 44 through 49 of series 603) The left posterior cerebral artery is patent with occlusion or stenosis. Within the right P2 segment areas short segment area of marked stenosis or occlusion (series 3, images 400-408). 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 Atherosclerotic calcification of the origin of the arch vessels without significant stenosis. Atherosclerotic calcification at the origin of the right vertebral artery origin resulting in at least mild stenosis. Mixed, though predominantly soft plaque atherosclerosis at the right ICA origin resulting in less than 50% stenosis by NASCET criteria. Atherosclerotic calcification at the left carotid bifurcation and proximal left internal carotid artery without stenosis by NASCET criteria. No aneurysm, AVM, or dissection is identified. Prior ACDF of C6-C7. Cervical spondylosis with multilevel neuroforaminal stenosis to marked degree at C3-C4, C4-C5 bilaterally, and C5-6 on the right. Multilevel mild degenerative central spinal stenosis. Dental and periodontal disease with scattered dental caries and periapical lucencies at teeth numbers teeth #1, at, 24, 25, and 26 The mastoid air cells are clear. Minimal right maxillary mucosal thickening. Coronary artery calcification and left lung calcified granulomas.   ???  CTA head: 1. ???No acute intracranial hemorrhage or mass effect. 2. ???Dense cavernous carotid calcification. The degree of calcification limits luminal narrowing estimation though there are areas that appear to be at least mildly narrowed. 3. ???  Short segment occlusion or high-grade stenosis of the right P2 arterial segment. 4. ???Dense atherosclerotic calcification within the V4 segment segments. The degree of dense calcification limits luminal narrowing estimation. There could be moderate or high-grade stenoses 5. ???Severe stenoses or occlusions of the origins of the superior cerebellar arteries bilaterally. The vessels are patent more distally. 6. ???Mild stenosis of the left M1 origin 7. Hypodensity within the left pons and left middle cerebellar peduncle, concordant with the the probable prior infarct on the MRI performed shortly before this exam. 8. Moderate additional patchy supratentorial white matter hypodensities, likely secondary to chronic microvascular ischemic changes. CTA neck: 1. 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 No significant left carotid stenosis by NASCET criteria. 2. ???Atherosclerotic calcification at the right vertebral artery origin resulting in at least mild stenosis. 3. ???Cervical spondylosis with up to marked multilevel neuroforaminal stenosis and multilevel mild central spinal stenosis. 4. ???Dental and periodontal disease as above. 5. Coronary artery calcification By my electronic signature, I attest that I have personally reviewed the images for this examination and formulated the interpretations and opinions expressed in this report   ???  Mri C-spine Wo Contrast  ???  Result Date: 02/01/2019  EXAM: MRI BRAIN AND C-SPINE HISTORY: Gait disorder. TECHNIQUE: Multiplanar and multisequence MR imaging of the head and C-spine was performed. This was done without contrast. COMPARISON: Same-day CTA head/neck. FINDINGS: Dr. Dimple Casey, M.D. has personally reviewed these images and formulated the interpretations and opinions expressed in this report. Brain: There is ill-defined FLAIR hyperintensity and elevated diffusion-weighted signal without restriction involving the left aspect of the pons and left middle cerebellar peduncle is noted. Per review of the medical record the patient had a acute pontine infarct which occurred in June 2020, though the images from that exam are not available for comparison. There are a few tiny low signal intensity foci of susceptibility in the left aspect of the pons in the region of this infarct, likely hemosiderin deposition relating to petechial type hemorrhage. There is no significant atrophy. There are underlying patchy T2 hyperintensities within the center and right pons as well. Moderate patchy and confluent supratentorial white matter FLAIR hyperintensities. Patchy T2 hyperintense signal in the midbrain, pons, and right cerebellar hemispheric white matter also noted. There are several perivascular spaces which are prominent in the basal ganglia bilaterally. The ventricles and subarachnoid spaces are normal in size and configuration for age. There is no midline shift or mass effect. The central arterial vascular flow-voids are preserved, however, there were areas of atherosclerotic calcification which are better evaluated on the same day CTA of the head. There is mild mastoid fluid bilaterally. The paranasal sinuses appear clear. Globes and orbits are unremarkable apart from prior lens surgery. C-spine: Prior ACDF at C6-C7. Artifact related to indwelling hardware slightly limits evaluation. Exam is also slightly limited due to motion degradation. Straightened cervical lordosis. Normal alignment apart from trace degenerative anterolisthesis at C7-T1. Vertebral body heights are maintained. Mild heterogeneous marrow signal without a discrete osseous lesion. Mild multilevel disc degeneration characterized by loss of disc height and signal. Focal T2 hyperintense signal abnormality measuring approximately 1.0 cm is present centered at the C7 vertebral body level with associated mild volume loss of the cervical cord at this location. No obvious additional area of cord signal abnormality, though evaluation is significantly limited due to motion. Level by level analysis as below: C2-C3: Posterior disc osteophyte complex, uncovertebral and  facet hypertrophy results in up to mild central stenosis and up to mild left neural foraminal narrowing. C3-C4: Posterior disc osteophyte complex, uncovertebral and facet hypertrophy results in up to mild central stenosis as well as at least moderate bilateral neural foraminal narrowing. C4-C5: Posterior disc osteophyte complex, uncovertebral and facet hypertrophy results in at least mild central stenosis as well as at least moderate bilateral neural foraminal narrowing. C5-C6: Limited evaluation due to metallic susceptibility artifact. There appears to be a posterior disc osteophyte complex which results in at least moderate central spinal stenosis. Additionally, there is likely at least moderate bilateral neural foraminal narrowing at this level. C6-C7: Prior ACDF. No obvious central spinal stenosis or neural foraminal narrowing at this level. C7-T1: No significant stenosis. Paraspinous soft tissues are unremarkable.   ???  Brain: 1. ???Signal abnormality without atrophy involving the left aspect of the pons and left middle cerebellar peduncle. This likely corresponds with the reported recent infarct in June 2020 (per review of the medical record), though these images are not available for comparison. The lack of significant atrophy somewhat goes against brainstem/cerebellar neurodegenerative diseases. Subacute nonspecific demyelination in a post infectious/inflammatory setting or other infectious/inflammatory encephalitides could produce this appearance. Comparison with prior available films would be of benefit. 2. ???Moderate chronic small vessel ischemic changes involving the cerebral white matter and pons and tiny old right cerebellar infarct. C-spine: 1. ???Limited evaluation due to metallic susceptibility artifact and motion degradation. 2. ???Prior ACDF at C6-C7 without high-grade central or neural foraminal stenosis at the operated level. 3. ???Focal T2 hyperintense signal abnormality within the cervical cord at the C7 vertebral level with associated volume loss, compatible with myelomalacia. No obvious additional area of cord signal abnormality or acute edema, though evaluation is limited as above. 4. ???Multilevel central spinal stenosis, most pronounced of suspected at least moderate degree at C4-C5. 5. ???Multilevel degenerative neural foraminal narrowing, of likely at least moderate degree at several levels, as detailed. By my electronic signature, I attest that I have personally reviewed the images for this examination and formulated the interpretations and opinions expressed in this report ???Finalized by Dimple Casey, M.D. on 02/01/2019 9:34 AM. Dictated by Elijah Birk, M.D. on 02/01/2019 8:00 AM.   ???  2d + Doppler Echo  ???  Result Date: 02/01/2019  ??? Overall LV systolic function is normal. LVEF by visual estimation is ~=55-60%. There are no segmental wall motion abnormalities ??? Grade I (mild) left ventricular diastolic dysfunction. Normal left atrial pressure ??? Right ventricular systolic function is normal ??? Moderately dilated left atrium. There is no interatrial shunting by color flow Doppler and saline contrast studies ??? Mildly elevated central venous pressure ??? 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 Mild mitral annular calcification without stenosis ??? The proximal ascending aorta is borderline dilated based on the patient's body habitus ??? Estimated Peak Systolic PA Pressure 21 mmHg ???In comparison to a prior limited resting echo study dated 05/29/2000, there has been progression of calcific change involving the aortic and mitral valves in the interim         Brief Hospital Course:    68 year old right-handed male with a past medical history of hypertension, diabetes type 2, C6-C7 ACDF in 1997 and orthostasis who presented to [facility] hospital for a transient episode of dysarthria for about 1 hour and half in the morning of January 31, 2019.  In addition to dysarthria the patient had associated right arm irregular involuntary movement  with altered mental status bilateral lower extremity shaking/weaknesses.  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  He was treated at [facility]. [facility].  He went to rehabilitation hospital after distant stroke and after discharge from rehab he had more than 13 falls using walker, primarily at night.  This information is gathered from chart review. Based on the patient's chart review, his last normal prior to admission at [facility] was earlier in the morning of July 20 when he was walking to the bathroom and when he came out of the bathroom his wife saw him barely sitting in the edge of the couch with slurred speech and large amplitude irregular movement of the right arm and shakiness of both legs.  In addition the wife did report that he had a similar event at the night of July 17.  Additionally, the patient does have a longstanding history of tremors when he tries to eat something or to do something with his hands, he was seen in the past by 2 different neurologist and was told that there is a very low concern for Parkinson disease.  No resting tremor reported.  Additionally he does have an intentional weight loss of 30 pounds over the past year history, and residual right upper extremity weakness secondary to his cervical spine surgery in the 1990s.  While he was at [facility], he had a work-up for seizures, EEG was negative neurology differential included underlying MSA or other parkinsonism type disorders.      Rehab Course:   While inpatient at [facility], the patient remained medically stable and actively participated in all rehabilitation therapies including PT/OT/SLP therapy to make good functional progress toward functional goals as outlined below in pre and post admission FIMS.  IRF stay was complicated by hypotension episodes, dizziness and lightlessness. On 02/21/2019 at around 1400 this afternoon I was paged by the physical therapist that the patient was declining therapy due to significant fatigue, nausea and vomiting.  He appeared lethargic and declining in mobility and function.  He was seated in the chair with wife at bedside and require maximum assistance of 2 with mobility, he demonstrated a right-sided truncal instability unable to remain midline.  He was seen earlier this morning and complain of nausea and vomiting, his speech was slurred with worsening left-sided tremors.  I then evaluated him again at 1400 this afternoon and acclivity saw a market decline in neurological status, functional mobility, worsening slurred speech, lethargic, still able to answer questions, oriented, but actively vomiting, nauseated, unable to track external ocular motions with the left eye as well as he had in the past, and with equivocal left-sided pronator drift but worsening tremors.  Considering the patient's history of stroke x2 I think is very prudent to go ahead and activate a stroke code and therefore of discussed with the charge nurse and a stroke code was activated.  I discussed the case with nursing staff and with the emergency department physician at [facility].  Informed the emergency department of worsening slurred speech, lethargy, equivocal neurological deficits but market trunk instability. Patient was discharged and transferred to the emergency department. Recommended stroke work-up.   ???  Evaluation FIMS Current FIMS     Eating FIM: 5 - Set up with containers  Grooming FIM: 5 - Verbal cues or coaxing  Bathing FIM: 1 - Total assistance, patient performs less than 25% of grooming/bathing/dressing/toileting tasks  Dressing - Upper  Body FIM: 3 - Moderate assistance, patient performs 50-74% or more of grooming/bathing/dressing/toileting tasks  Dressing - Lower Body FIM: 1 - Total assistance, patient performs less than 25% of grooming/bathing/dressing/toileting tasks  Toileting FIM: 3 - Moderate assistance, patient performs 50-74% or more of grooming/bathing/dressing/toileting tasks  Bladder FIM: 6 - Modified independence - Device  Bowel FIM: 6 - No bowel movement, medication  Transfers FIM: 4 - Contact guard assistance Toilet Transfers FIM: 3 - Moderate assistance, patient performs 50-74% of transferring tasks (lifting required)     Shower Transfers FIM: 3 - Moderate assistance, patient performs 50-74% of transferring tasks (lifting required)  Gait: 1 - Toal assistance, two or more people, < 50 feet     Stairs FIM: 0 - Activity did not occur - Other (comment)  Comprehension FIM: 5 - Standby prompting. Understands abstract and/or basic information greater than 90% of the time, prompting less than 10%  Expression FIM: 5 - Standy prompting - Able to express abstract and/or basic information >90% of the time, prompting <10%  Social Interaction FIM: 6 - Modified independence - Able to interact appropriately, only occasionally loses control and able to self-correct  Problem Solving FIM: 3 - Moderate prompting - Able to solve routine problems 50-74%. Needs direction less than half of the time to initiate, plan or complete daily activities  Memory FIM: 2 - Maximal promting - Able to recognize people frequently encountered, remembers daily routines, responds to requests of others 25-49% of the time, needs prompting less than half of the time   Eating FIM: 5 - Set up with containers  Grooming FIM: 5 - Verbal cues or coaxing  Bathing FIM: 1 - Total assistance, patient performs less than 25% of grooming/bathing/dressing/toileting tasks  Dressing - Upper Body FIM: 3 - Moderate assistance, patient performs 50-74% or more of grooming/bathing/dressing/toileting tasks  Dressing - Lower Body FIM: 1 - Total assistance, patient performs less than 25% of grooming/bathing/dressing/toileting tasks  Toileting FIM: 3 - Moderate assistance, patient performs 50-74% or more of grooming/bathing/dressing/toileting tasks  Bladder FIM: 6 - Modified independence - Device  Bowel FIM: 6 - Modified independence - Medication  Transfers FIM: 4 - Contact guard assistance  Toilet Transfers FIM: 3 - Moderate assistance, patient performs 50-74% of transferring tasks (lifting required)     Shower Transfers FIM: 3 - Moderate assistance, patient performs 50-74% of transferring tasks (lifting required)  Gait: 4 - Minimal contact assistance, patient expends 75% or more effort, greater than or equal to 150 feet     Stairs FIM: 0 - Activity did not occur - Other (comment)  Comprehension FIM: 5 - Standby prompting. Understands abstract and/or basic information greater than 90% of the time, prompting less than 10%  Expression FIM: 6 - Modified independence - Extra time  Social Interaction FIM: 6 - Modified independence - Able to interact appropriatelyin most situations and requires extra time (3 times normal)  Problem Solving FIM: 4 - Minimal prompting - Able to solve routine problems 75-90%  Memory FIM: 4 - Minimal prompting - Able to recognize people frequently encountered, remembers daily routines, responds to requests of others 50-74% of the time, needs promting less than half of the time     Physical Therapy Current Recommendations/Plan/Goals    Plan  Comments: discharge FIMs, family training.   Recommendations  PT Discharge Recommendations: Home with Assistance, Home Health Setting  Equipment Recommendations: (AFO-possibly)  Expected Discharge Date: 02/22/19  Weekly Goals  Weekly Bed Mobility Goals: Patient will perform sit to  supine with, Patient will perform supine to sit with  Patient will perform sit to supine with: Modified independent, Progressing  Patient will perform supine to sit with: Modified independent, Progressing  Weekly Transfer Goals: Patient will complete sit to stand transfer with, Patient will complete stand to sit transfer with, Patient will complete stand pivot transfer with  Patient will complete sit to stand transfer with: Stand by assistance, Achieved(new goal: modified indep)  Patient will complete stand to sit transfer with: Stand by assistance, Achieved(new goal: modified indep) Patient will complete stand pivot transfer with: Stand by assistance, Progressing  Weekly Ambulation/Stairs Goals: Patient will ascend/descend  Patient will ambulate: 150 feet, Minimum assistance, Achieved(new goal: modified indep)  Patient will ascend/descend: 12 stairs, 1 rail, Stand by assistance(new goal)  Goal(s) for the Stay  Patient will perform: household mobility, at ambulation level, Modified independent, Progressing    Occupational Therapy Current Recommendations/Plan/Goals    Recommendations  OT Discharge Recommendations: Home with Home Health, Vs, Home setting w/Outpatient Therapy  OT Equipment Recommendations: Patient owns necessary equipment  Expected Discharge Date: 02/22/19  Recommendations  OT Discharge Recommendations: Home with Home Health, Vs, Home setting w/Outpatient Therapy  OT Equipment Recommendations: Patient owns necessary equipment  Expected Discharge Date: 02/22/19  ADL Goals: Bathing, Dressing UE, Dressing LE, Grooming, Toileting  Goals for the stay  Pt will perform basic care and transfer with: Modified independence, Progressing(least restrictive device, assistance with IADLs)      Condition at Discharge: Further work-up at ED    Discharge Diagnoses:    Left pontine stroke (CVA), late effects below  > Acute onset of encephalopathy, AMS, worsening dysarthria, nausea, emesis, truncal instability     Hospital Problems        Active Problems    * (Principal) Left pontine stroke (HCC)    Orthostatic hypotension    HTN (hypertension)    Tremors of nervous system    T2DM (type 2 diabetes mellitus) (HCC)    History of neck surgery    Gait abnormality    Right hemiparesis (HCC)    Cirrhosis (HCC)          Surgical Procedures: None    Significant Diagnostic Studies and Procedures: noted in brief hospital course    Consults:  None    Patient Disposition: ED       Patient instructions/medications:       Diabetic Diet    You should eat between 1600 and 2000 calories per day.  This is equal to 60g (grams) of carbohydrates per meal, and 30g of carbohydrates for a bedtime snack.    If you have questions about your diet after you go home, you can call a dietitian at [phone].     Risk Reduction Plan    Stroke Personal Risk Factor Reduction Plan  **Take this sheet to your physician to show treatment recommendations**    Depression Risk  Goal:    It is common to experience depression after a stroke.                  Plan:    If you experience symptoms of depression, please follow-up with your primary care physician.     High Blood Pressure Risk   Goal:    Keep blood pressure below 130/80  Your Numbers:     BP Readings from Last 1 Encounters:  02/21/19 : 134/75        Plan:    High blood pressure is the single most important  risk factor for stroke because it's the #1 cause of stroke.  Take medication as prescribed and monitor your blood pressure.    Atrial Fibrillation Risk   Goal:     This heart rhythm raises the risk of stroke. Anticoagulants may be prescribed.                    Your Numbers: INR       Date                     Value               Ref Range           Status                02/04/2019               1.2                 0.8 - 1.2           Final            ----------  Plan: Follow up with your Primary Care Physician to manage your anticoagulation therapy, if you have been diagnosed with atrial fibrillation.    Abnormal lipids (fats in blood) Risk  Goal: Total Cholesterol: <200  LDL (bad cholesterol): <100  HDL (good cholesterol): >40 for men, >50 for women  Triglycerides: <150  Your Numbers: Cholesterol       Date                     Value               Ref Range           Status                02/01/2019               193                 <200 MG/DL          Final            ----------  LDL       Date                     Value               Ref Range           Status                02/01/2019               124 (H)             <100 mg/dL          Final            ----------  HDL Date                     Value               Ref Range           Status                02/01/2019               60                  >  40 MG/DL           Final            ----------  Triglycerides       Date                     Value               Ref Range           Status                02/01/2019               70                  <150 MG/DL          Final            ----------  Plan: Diets high in saturated fat, trans fat and cholesterol can raise blood cholesterol levels increasing your risk of stroke.  Take medication as prescribed and eat a heart healthy, low sodium diet.    Smoking Risk   Goals: If you smoke, STOP!      Plan: Smoking DOUBLES your risk of stroke. Quitting can greatly reduce your risk. To register for smoking cessation program call [phone] or visit www.smokefree.gov    Diabetes Risk   Goal: for Non-diabetic: Below 5.7%  Goal for diabetic: Less than 7%  Your Numbers: Hemoglobin A1C       Date                     Value               Ref Range           Status                01/31/2019               6.8 (H)             4.0 - 6.0 %         Final              Comment:    The ADA recommends that most patients with type 1 and type 2 diabetes maintain     an A1c level <7%.      ----------  Plan: If you have diabetes, even if treated, you are at an increased risk of stroke. To minimize your risk, make sure to follow-up with a primary care provider or endocrinologist and check your blood sugars frequently.    Alcohol use Risk   Goal: Alcohol abuse can lead to stroke.  Plan: For men, limit intake to no more than 2 drinks per day.  For women, limit intake to no more than one drink per day.    Weight Management Risk  Goal: Healthy: BMI is 18.5 to 24.9  Overweight: BMI is 25 to 29.9  Obese: BMI is 30 or higher  Your Numbers: Your BMI (Calculated): 28.25  Plan: If you have questions about your diet after you go home, you can call a dietitian at [phone]    Physical Activity Risk Goal: Stroke patients should have approval by a physician prior to beginning an exercise program.  Plan: Try to get at least 30 minutes of moderate physical activity five days a week or 20 minutes of vigorous physical activity three days a week with your doctor's approval.  ACT F.A.S.T - Call 911 if Stroke Warning Signs Occur    Face-         Ask the person to smile.   Does one side of the face droop?    Arms-         Ask the person to raise both arms.   Does one arm drift downward?    Speech-    Ask the person to say a simple sentence.    Time-        If the person shows any of these symptoms, time is important. Call 911.     Questions About Your Stay    STANDARD For questions or concerns regarding your hospital stay:    -DURING BUSINESS HOURS (8:00 AM - 4:30 PM):  Call [phone] and ask to be transferred to your discharge attending physician.    -AFTER BUSINESS HOURS (4:30 PM - 8:00 AM, on weekends, or holidays):  Call [phone] and ask the operator to page the on-call doctor for the discharge attending physician.     Discharging attending physician: Judd Gaudier [[id number]]      Activity as Tolerated    It is important to keep increasing your activity level after you leave the hospital.  Moving around can help prevent blood clots, lung infection (pneumonia) and other problems.  Gradually increasing the number of times you are up moving around will help you return to your normal activity level more quickly.  Continue to increase the number of times you are up to the chair and walking daily to return to your normal activity level. Begin to work towards your normal activity level at discharge.     Driving Restrictions    No driving until physician approval at follow-up appointment for 6 months     Report These Signs and Symptoms    STROKE Call 911 for the following symptoms:    *Sudden numbness or weakness of the face, arm or leg, especially on one side of the body. *Sudden confusion, trouble speaking or understanding.  *Sudden trouble seeing in one or both eyes.  *Sudden trouble walking, dizziness, loss of balance or coordination.  *Sudden, severe headache with no known cause.     Stroke Information    F.A.S.T. is an easy way to remember the sudden signs of a stroke.    F - face drooping  A - arm weakness  S - speech difficulty  T - TIME TO CALL 911    If the person shows any of theses signs, even if the signs go away, call 9-1-1 IMMEDIATELY. Check the time so you will know when the first sign started.    Traits and lifestyle habits that increase your risk for stroke include prior stroke/TIA, age, gender, heredity/race and prior heart attack.    Continue your medications as ordered after discharge. These medications will help reduce your risk of another stroke.    Your most recent cholesterol levels, if taken during your stay, will display below. LDL is called bad cholesterol because it can stick to your artery walls and block blood flow. Your LDL should be below 100. HDL is called the good cholesterol. Your HDL should be greater than 40.  Your lab results on 02/01/2019: HDL 60 MG/DL (Ref range: >08 MG/DL); LDL 657 mg/dL* (Ref range: <846 mg/dL).    It is important that you follow-up with your primary care physician 4 to 6 weeks after discharge. Be sure to keep all follow-up appointments. Please bring your discharge  instructions with you to your follow-up appointments.    Should you have any questions once you have returned home, please feel free to contact the Stroke Program Coordinator at [phone].    Your last blood pressure was BP: 134/75, Your target blood pressure is 140/90    If you have diabetes, even if treated, you are at an increased risk of stroke.  Many people with diabetes also have high blood pressure, high cholesterol or are overweight.  This increases your risk even more.      Smoking doubles your risk of stroke.  Quitting can greatly reduce your risk.  For more information on smoking cessation call [phone] or visit [email or web address].     Call Center Appointment Request     Location [facility]    Appointment within: 8 wks    [facility] Provider Su Monks [[id number]]    Reason for Appt Follow up inpatient    Pager # of the requesting provider if any questions? [phone]     Cannot display discharge medications since this patient is expected but has not yet arrived.    Scheduled appointments:    Feb 22, 2019 12:40 PM CDT  [facility] Office Visit with [facility] ROOM 8  [facility] Pre [facility]est[facility] (--) [address]  [address]  [place] [address]  [phone]   Feb 22, 2019  2:15 PM CDT  New Patient with Robbie Lis, MD  The [facility] (Surgery) [address]  [place] [address]  [phone]   Feb 22, 2019  3:20 PM CDT  (Arrive by 3:05 PM)  New Patient with Raed Toney Rakes, MD  The [facility] Exam) [address] [place]  [address] [place] [address]  [place] [address]  [phone]   Mar 07, 2019  8:00 AM CDT  (Arrive by 7:00 AM)  GENERAL IR LIVER BIOPSY with Robbie Lis, MD, IR ROOM 5  [facility] Radiology [facility] Radiology) [address]  [address]  [place] [address]  [phone]   Mar 07, 2019  9:00 AM CDT  (Arrive by 8:00 AM)  GENERAL IR BODY EMBOLIZATION with Robbie Lis, MD, IR ROOM 4  [facility] [place] [facility] Radiology [facility] Radiology) [address]  [address]  [place] [address]  [phone]   Mar 07, 2019 11:00 AM CDT  (Arrive by 10:00 AM)  IR ABLATION with Robbie Lis, MD, IR ROOM 5  [facility] [place] [facility] Radiology [facility] Radiology) [address]  [address]  [place] [address]  [phone]   Mar 23, 2019  9:00 AM CDT  (Arrive by 8:45 AM)  New Patient with Charlies Silvers, MD The [facility] (Neurology) [address]  [address]  [place] [address]  [phone]          Pending items needing follow up: None     Signed:  Muzammil Bruins Jenetta Loges, MD  02/21/2019      cc:  Primary Care Physician:  Wilford Grist   Verified  Referring physicians:  Su Monks, MD   Additional provider(s):

## 2019-02-21 NOTE — ED Notes
Calling report at this time

## 2019-02-21 NOTE — Progress Notes
OCCUPATIONAL THERAPY     02/21/19 1116   Subjective   Subjective Patient was seated in bedside recliner, reporting that his stomach was still upset.  Patient declined participation in OT ADL session for family training due to not feeling well.  Spouse plans to spend the night and stay with patient until discharge tomorrow.   OT rescheduled ADL family training session for 0715 tomorrow.     Session Information   Missed Minutes 60 Minutes   Reasons for Missed Minutes Nausea / sick     Molli Knock, OTR/L

## 2019-02-21 NOTE — Progress Notes
PHYSICAL THERAPY     02/21/19 1000   Subjective   Subjective Patient feels weak after an episode of emesis this morning. He doesn't feel like his speech is flowing that well & he wants to defer therapy to this afternoon. His wife is concerned about the shaking episodes.    Transfers   Transfer: Electrical engineer: Sit to stand assist level Stand by assistance   Transfer: Sit to stand type of assist For safety   Transfer: Stand to sit assist level Stand by assistance   Transfer: Stand to sit type of assist For safety   Transfer Comment Patient performed x3 stands from bedside chair with extra time.    Assessment   Assessment Patient appears to be transferring at the same level as last week; he wasnt feeling great today due to emesis early.    Plan   Comments discharge FIMs, family training.      Doren Custard, PT, DPT

## 2019-02-22 ENCOUNTER — Inpatient Hospital Stay: Admit: 2019-03-08 | Discharge: 2019-03-08

## 2019-02-22 ENCOUNTER — Encounter: Admit: 2019-02-22 | Discharge: 2019-02-22

## 2019-02-22 ENCOUNTER — Inpatient Hospital Stay: Admit: 2019-03-09 | Discharge: 2019-03-09

## 2019-02-22 DIAGNOSIS — I69322 Dysarthria following cerebral infarction: Principal | ICD-10-CM

## 2019-02-22 LAB — COCAINE-URINE RANDOM: Lab: NEGATIVE mL/min (ref 4–12)

## 2019-02-22 LAB — LIPID PROFILE: Lab: 154 mg/dL — ABNORMAL LOW (ref <200)

## 2019-02-22 LAB — COVID-19 (SARS-COV-2) PCR

## 2019-02-22 LAB — PHENCYCLIDINES-URINE RANDOM: Lab: NEGATIVE % (ref 0–2)

## 2019-02-22 LAB — CANNABINOIDS-URINE RANDOM: Lab: NEGATIVE mL/min (ref 24–44)

## 2019-02-22 LAB — OPIATES-URINE RANDOM: Lab: NEGATIVE % (ref 0–5)

## 2019-02-22 LAB — BENZODIAZEPINES-URINE RANDOM: Lab: NEGATIVE mg/dL (ref 8.5–10.6)

## 2019-02-22 LAB — BARBITURATES-URINE RANDOM: Lab: NEGATIVE mg/dL (ref 0.4–1.24)

## 2019-02-22 LAB — AMMONIA: Lab: 101 umol/L — ABNORMAL HIGH (ref 9–35)

## 2019-02-22 LAB — CBC AND DIFF: Lab: 6.8 K/UL (ref <150)

## 2019-02-22 LAB — COMPREHENSIVE METABOLIC PANEL: Lab: 137 MMOL/L — ABNORMAL LOW (ref 137–147)

## 2019-02-22 LAB — AMPHETAMINES-URINE RANDOM: Lab: NEGATIVE mg/dL — ABNORMAL LOW (ref 7–25)

## 2019-02-22 MED ORDER — LATANOPROST 0.005 % OP DROP
1 [drp] | Freq: Every evening | OPHTHALMIC | 0 refills | Status: DC
Start: 2019-02-22 — End: 2019-02-23
  Administered 2019-02-23: 02:00:00 1 [drp] via OPHTHALMIC

## 2019-02-22 MED ORDER — GADOBENATE DIMEGLUMINE 529 MG/ML (0.1MMOL/0.2ML) IV SOLN
14 mL | Freq: Once | INTRAVENOUS | 0 refills | Status: CP
Start: 2019-02-22 — End: ?
  Administered 2019-02-23: 01:00:00 14 mL via INTRAVENOUS

## 2019-02-22 MED ORDER — MIDODRINE 5 MG PO TAB
5 mg | Freq: Three times a day (TID) | ORAL | 0 refills | Status: DC
Start: 2019-02-22 — End: 2019-02-23
  Administered 2019-02-23 (×2): 5 mg via ORAL

## 2019-02-22 NOTE — Progress Notes
PHYSICAL THERAPY  ASSESSMENT      Name: Mitchell Lucero        MRN: [id number]          DOB: Nov 26, 1950          Age: 68 y.o.  Admission Date: 02/21/2019             LOS: 0 days      Mobility  Patient Turn/Position: Chair  Progressive Mobility Level: Walk in room  Distance Walked (feet): 5 ft  Level of Assistance: Assist X2  Assistive Device: Walker  Time Tolerated: 11-30 minutes  Activity Limited By: [employer];Other (Comment)(Ataxic, globally weak (RLE initiation more difficult vs LLE))    Subjective  Significant hospital events: Mitchell Lucero is a 68 y.o. 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 CTP showed mismatch localizing to R superior cerebellar artery. MRI with acute to subacute R superior cerebellar infarct.   Mental / Cognitive Status: Alert;Cooperative(Responses slightly delayed, but AO x4)  Persons Present: Spouse  Pain: Patient has no complaint of pain  Pain Interventions: Patient agrees to participate in therapy  Comments: Ted hose and abdominal binder donned prior to upright mobility (history of orthostatic hypotension and multiple drop falls prior to previous admission).   Ambulation Assist: Assist Needed with Mobility-Related ADL's/Ambulation  Patient Owned Equipment: Nurse, adult  Home Situation: Lives with Family(Spouse and mother)  Type of Home: House(Split-level)  Entry Stairs: Stair Glide  In-Home Stairs: Stair Glide  Comments: PT familiar with patient from previous admission, after which patient discharged to [facility] prior to returning to acute setting with new cerebellar infarct. Patient was about to discharge home from [facility] prior to onset of new CVA. Patient visibly and very understandably disappointed by these recent events. Both patient and spouse very pleasant and agreeable to work with PT this afternoon.     Strength  Strength Position Assessed: Seated  Overall Strength: Generalized Weakness  Strength Comment: RLE appears weaker compared to LLE, however both major muscle groups notably weaker bilaterally compared to previous admission.     Posture/Neurological  Posture/Neuro Comments: Patient denies changes in hearing or vision since recent infarct. Denies numbness/tingling in UEs/LEs as well.     Bed Mobility/Transfer  Comments: Patient up in bedside chair at beginning and end of session. Bed mobility not assessed this date.   Transfer Type: Sit to Stand  Transfer: Assistance Level: From;Bed Side Chair;Moderate Assist  Transfer: Assistive Device: Nurse, adult  Transfers: Type Of Assistance: For Safety Considerations;For Strength Deficit;For Balance;Verbal Cues  Other Transfer Type: Sit to/from Stand  Other Transfer: Assistance Level: To/From;Bed Side Chair;Moderate Assist  Other Transfer: Assistive Device: Nurse, adult  Other Transfer: Type Of Assistance: For Safety Considerations;For Strength Deficit;For Balance;Verbal Cues  End Of Activity Status: Up in Chair;Nursing Notified;Instructed Patient to Request Assist with Mobility;Instructed Patient to Use Call Light  Comments: Requires verbal cues for hand placement on armrest of chair prior to standing. Once standing, patient initially slightly retrouplsive on heels, but able to weight shift forward without major issue.     Gait  Gait Distance: 5 feet  Gait: Assistance Level: Moderate Assist;x2 People;Safety Considerations  Gait: Assistive Device: Nurse, adult;Wheelchair Follow  Gait: Descriptors: Decreased step length;Variable step length;Decreased foot clearance RLE Comments: Significant verbal cueing provided for weight shifting to advance contralateral limb this date. PT then trialed vertical mirror for visual feedback for the patient and encouraged wider base of support and even step length prior to advancing the RW. Patient requires 2 bouts with seated rest to complete 5 feet of gait this afternoon.   Activity Limited By: Weakness    Education  Persons Educated: Patient/Family  Patient Barriers To Learning: None Noted  Interventions: Repetition of Instructions;Demonstration Provided  Teaching Methods: Verbal Instruction;Demonstration  Patient Response: More Instruction Required  Topics: Plan/Goals of PT Interventions;Use of Assistive Device/Orthosis;Mobility Progression;Safety Awareness;Up with Assist Only;Importance of Increasing Activity;Recommend Continued Therapy    Assessment/Progress  Impaired Mobility Due To: Decreased Strength;Impaired Balance;Decreased Activity Tolerance;Safety Concerns  Assessment/Progress: Should Improve w/ Continued PT    AM-PAC 6 Clicks Basic Mobility Inpatient  Turning from your back to your side while in a flat bed without using bed rails: A Little  Moving from lying on your back to sitting on the side of a flatbed without using bedrails : A Little  Moving to and from a bed to a chair (including a wheelchair): A Lot  Standing up from a chair using your arms (e.g. wheelchair, or bedside chair): A Lot  To walk in hospital room: A Lot  Climbing 3-5 steps with a railing: Total  Raw Score: 13  Standardized (T-scale) Score: 33.99  Basic Mobility CMS 0-100%: 57.65  CMS G Code Modifier for Basic Mobility: CK    Goals  Goal Formulation: With Patient/Family  Time For Goal Achievement: 5 days, To, 7 days  Patient Will Go Supine To/From Sit: w/ Stand By Assist  Patient Will Transfer Bed/Chair: w/ Stand By Assist  Patient Will Transfer Sit to Stand: w/ Stand By Assist  Patient Will Ambulate: 51-100 Feet, w/ Dan Humphreys, w/ Stand By Assist    Plan Treatment Interventions: Mobility Training;Strengthening  Plan Frequency: 5 Days per Week  PT Plan for Next Visit: Progress gait distance with chair follow for safety. Consider gait at the rail vs. parallel bars and therapist in position to assist with advancement of RLE as needed. Consider ACE wrap for dorsiflexion assist if foot dragging noted. Trial side-stepping at bedside and pre-gait activity as tolerated.     PT Discharge Recommendations  Recommendation: Inpatient setting;Recommend rehab medicine consult  Patient Currently Requires Physical Assist With: All mobility    Therapist  Edward Jolly, PT, DPT [phone]  Date  02/22/2019

## 2019-02-22 NOTE — Progress Notes
EEG completed without complications

## 2019-02-22 NOTE — Procedures
EPILEPSY REPORT      Mitchell Lucero  1951/06/09  [id number]    Date of study: 02/22/19    REQUESTING PHYSICIAN:Wang    MEDICATIONS:     Current Facility-Administered Medications:   ???  acetaminophen (TYLENOL) tablet 650 mg, 650 mg, Oral, Q4H PRN, Dot Been, MD  ???  aspirin EC tablet 81 mg, 81 mg, Oral, Clyde Lundborg, MD, 81 mg at 02/22/19 0931  ???  atorvastatin (LIPITOR) tablet 80 mg, 80 mg, Oral, QHS, Dot Been, MD  ???  citalopram (CeleXA) tablet 20 mg, 20 mg, Oral, Clyde Lundborg, MD, 20 mg at 02/22/19 0944  ???  clopiDOGrel (PLAVIX) tablet 75 mg, 75 mg, Oral, QDAY, Dot Been, MD, 75 mg at 02/22/19 0931  ???  docusate (COLACE) capsule 100 mg, 100 mg, Oral, BID, Dot Been, MD, 100 mg at 02/22/19 0933  ???  enoxaparin (LOVENOX) syringe 40 mg, 40 mg, Subcutaneous, Dorthula Perfect, Dot Been, MD, 40 mg at 02/21/19 2053  ???  lactulose oral solution 20 g, 20 g, Oral, TID, Dot Been, MD, 20 g at 02/22/19 0931  ???  latanoprost (XALATAN) 0.005 % ophthalmic solution 1 drop, 1 drop, Both Eyes, QHS, Provider, Pharmacy  ???  melatonin tablet 3 mg, 3 mg, Oral, QHS, Dot Been, MD  ???  midodrine (PROAMATINE) tablet 2.5 mg, 2.5 mg, Oral, TID, Dot Been, MD, 2.5 mg at 02/22/19 0932  ???  pantoprazole DR (PROTONIX) tablet 20 mg, 20 mg, Oral, QDAY, Dot Been, MD      TECHNIQUE:  This  routine EEG monitoring was performed on a 32-channel digital recording device.  The 10-20 [employer] Electrode Placement System was used with addition of T1 and T2 electrodes.       This routine EEG is requested for this 68 year old man with a question of seizure as a cause of his episodes of altered mental status.    During maxiaml wakefulness, there was 7 hz posterior dominant rhyhtm. Remainder of the background activity consisted of mixed frequency polymorphic theta activity. During presumed drowsiness there was diffuse attenuation of the background activity and mixed frequency theta ad delta activity. There were intermittent generalized anterior dominant 1-2 Hz delta activity at times intermixed with high amplitude generalized triphasic waves.         IMPRESSION:??? This routine EEG of this 68 year old man is abnormal due to diffuse theta slowing of the background activity, lack of well formed sleep structures, and intermittent bursts of diffuse delta activity intermixed with high amplitude triphasic waves suggestive of mild-moderate bi-hemispheric dysfunction of unspecific etiology such as but not limited to toxic, metabolic, pharmacologic, infectious, structural, hypoxic/ischemic, or postictal causes.    Maia Petties, MD  Associate Professor  [facility]  Department of Neurology

## 2019-02-22 NOTE — Progress Notes
1420- Physical Therapy notified RN of patient been tired and sleepy.  RN at bedside, vitals:  Temp- 36.7  HR- 77  RR- 16  SpO2-97  BP- 105/64  BG- 239    Patient seated in recliner, he is lethargic, having slurred speech, oriented to self, time, place and situation.  Dr. Gwen Pounds called to bedside and examined the patient. Patient showed right side weakness and vomited when transferred to bed.(with help from Physical Therapy).  Dr. Gwen Pounds stroke activated the patient to be transferred to ED.    EMS picked up the patient around 1450 for the transfer.    The patient had a bout of emesis in the morning too, which the provider was informed of during the rounds in am at bedside.

## 2019-02-22 NOTE — Progress Notes
RT Adult Assessment Note    NAME:Mitchell Lucero             MRN: [id number]             DOB:[birth year]          AGE: 68 y.o.  ADMISSION DATE: 02/21/2019             DAYS ADMITTED: LOS: 0 days    RT Treatment Plan:       Protocol Plan: Procedures  PAP: Place a nursing order for "IS Q1h While Awake" for any of Lung Expansion indicators  Comment: pulse ox per MD    Additional Comments:  Impressions of the patient: sleeping, no distress noted      Vital Signs:  Pulse: 75  RR: 16 PER MINUTE  SpO2: 98 %  O2 Device: Standby  Liter Flow:    O2%:    Breath Sounds:    Respiratory Effort:

## 2019-02-22 NOTE — Case Management (ED)
Case Management Progress Note    NAME:Mitchell Lucero                          MRN: [id number]              DOB:28-Apr-1951          AGE: 68 y.o.  ADMISSION DATE: 02/21/2019             DAYS ADMITTED: LOS: 0 days      Todays Date: 02/22/2019    Plan  Discharge planning on-going. PT, OT and Rehab consults pending.     Interventions  ? Support      ? Info or Referral      ? Discharge Planning    Attended huddle and reviewed EMR. Pt re-admitted from [facility]. Plan from rehab was to discharge today to home, however, had a new stroke. Will have Rehab, PT and OT re-assess.   SW notified [facility] admissions.   ? Medication Needs                                                 ? Financial      ? Legal      ? Other        Disposition  ? Expected Discharge Date    Expected Discharge Date: 02/22/19  Expected Discharge Time: 1600  ? Transportation   Does the patient need discharge transport arranged?: No  Transportation Name, Phone and Availability #1: pt's spouse [name]   Does the patient use Medicaid Transportation?: No  ? Next Level of Care (Acute Psych discharges only)      ? Discharge Disposition                                          Durable Medical Equipment      No service has been selected for the patient.      [facility]      No service has been selected for the patient.      [facility]      No service has been selected for the patient.      [facility]      No service has been selected for the patient.        Janean Sark, [place]

## 2019-02-22 NOTE — Progress Notes
OCCUPATIONAL THERAPY  NOTE       Name: Mitchell Lucero        MRN: [id number]          DOB: [birth year]          Age: 68 y.o.  Admission Date: 02/21/2019             LOS: 0 days    Patient was unavailable for occupational therapy, EEG technician setting up for testing.  Occupational therapy will continue to follow and provide intervention as indicated.    Therapist: Janie Morning, OTR/L [id number]  Date: 02/22/2019

## 2019-02-22 NOTE — Progress Notes
SPEECH-LANGUAGE PATHOLOGY  CLINICAL SWALLOW ASSESSMENT     EVALUATION SUMMARY  Summary: A clinical swallow evaluation was completed this date. Results suggest swallow WFL. No s/s of aspiration noted with PO trials. Pt/spouse deny any hx of dysphagia. Mild dysarthria present, which pt/spouse report has improved over the last day. Reviewed results/recommendations. Discussed s/s of aspiration and to notify staff should they occur. Pt was receiving SLP services on rehabilitation unit for cognitive therapy only. Will continue to follow, please see details below.     RECOMMENDATIONS  Regular solids and thin liquids  Medications PO as tolerated  Excellent oral care (2-3x daily) to reduce risk of aspiration of bacteria from the oral cavity  SLP department will f/u to ensure diet tolerance and further evaluation as indicated    Oral Stage Summary*: PO trials of thin liquids (via tsp/cup), purees, and regular solids provided. Bolus withdraw, transfer, and mastication appeared WNL. No anterior bolus loss nor oral residue noted.    Pharyngeal Stage Summary*: Suspect adeqate timing of the oropharyngeal swallow w/ adequate hyolaryngeal excursion. No s/s of aspiration noted.    Swallow Recommendations*  PO: Regular, Thin Liquids  Swallow Strategies: Small Bites/Sips, Slow Rate of Intake    Plan: Continue Treatment __x/week (Comment).(3-5x)    Prognosis: Good  NOMS Dysphagia Rating: 7-Normal -Ability to eat indep not limited by swallow function. Swallow safe/efficient for all consistencies. Compensatory strategies effectively used when needed.  Results Reported to Physician: Yes    Objective*  Relevant Med Background: Mitchell Lucero is a 68 y.o. male with a PMH of prior L lateral pons/cerebellar peduncle stroke, HTN, DMII who was woke up this morning at [facility] with worsening speech and generalized weakness. LKW was bedtime on 02/20/2019. Patient states he has experienced these symptoms before with his initial stroke this summer. He states he just feels extremely tired in conjunction with the neurologic deficits. He denies any fever, dysuria, abdominal pain. NIHSS was 5 on presentation for LOC, dysarthria, BLE drift, and RUE ataxia. CT H was unremarkable for acute changes. 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 CTP showed mismatch localizing to R superior cerebellar artery. Patient with improvement of symptoms after imaging. States he has experienced these symptoms before.    MRI HEAD WO CONTRAST: IMPRESSION  1. 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  2. ???Similar appearance of mildly expansile FLAIR hyperintensity in the   left middle cerebellar peduncle and left pons, a portion of which   demonstrates restricted/reduced diffusion. The appearance is not typical   for evolving infarct. This finding remains nonspecific and given lack of   evolution may represent neoplasm (such as low-grade glioma or possibly   anaplastic astrocytoma given diffusion abnormality). Other subacute   demyelination or infectious/inflammatory insult could produce this   appearance. Short-term follow-up exam in 6-8 weeks without and with   contrast recommended for reevaluation.  3. 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    CHEST SINGLE VIEW: IMPRESSION  Decreased inspiration with bibasilar right greater than left opacities   which may represent atelectasis or aspiration.    Lives With: [employer]  Help From: Family  Vocational: On Disability Psychosocial Status: Willing and Cooperative to Participate  Persons Present: Spouse    Subjective*  Pain: Patient has no complaint of pain  Pain Level Current*: No pain  Trach Presence: No  Feeding Tube Present During Eval: None    Nutrition*  Nutrition Prior To Hospitalization: Oral, Regular, Thin Liquids  Current Form Of Nutrition: NPO    Swallow Strategies  Small Bites and Sips: Effective  Slow Rate of Intake: Effective    ORAL MECH EXAM  Oral Mech WFL*: No  Oral Mech Exam Summary*: Mild dysarthria present. Some missing dentition. No other obvious oral mech abnormalities.    Education*  Persons Educated: Pt/Family  Barriers To Learning: None Noted  Interventions: Haematologist Educated, Family Educated, Repetition of Instructions  Teaching Methods: Verbal, Demonstration  Topics: Dysphagia  Patient Response: Verbalized Understanding  Goal Formulation: With Pt/Family    Clinical Swallow Goals*  Goal : Pt will tolerate regular solids and thin liquids with less than 10% s/s of aspiration and negative pulmonary status changes.       Therapist: Marland Kitchen.A. CCC-L/SLP Voalte: [id number]  Date:02/22/2019

## 2019-02-22 NOTE — Consults
Physical Medicine & Rehabilitation Consult Service    Name: Mitchell Lucero        MRN: [id number]          DOB: 29-Apr-1951        Age: 68 y.o.   Admission Date: 02/21/2019             LOS: 0     Date of Service: 02/22/19   Financial Class: Payor: Payor: [employer] MEDICARE / Plan: [employer] MEDICARE PPO / Product Type: Medicare /   Referring Physician: Emi Belfast  Reason for Consult: evaluate for Post-Acute Rehab/Placement  Precautions: Fall  Weight Bearing Precautions:  WBAT     Active Problems  Patient Active Problem List    Diagnosis Date Noted   ??? Thrombocytopenia (HCC) 02/22/2019   ??? 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 Dysarthria 02/21/2019   ??? Right hemiparesis (HCC) 02/10/2019   ??? Cirrhosis (HCC) 02/10/2019   ??? HTN (hypertension) 02/04/2019   ??? Tremors of nervous system 02/04/2019   ??? T2DM (type 2 diabetes mellitus) (HCC) 02/04/2019   ??? History of neck surgery 02/04/2019   ??? 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 TIA (transient ischemic attack) 02/04/2019   ??? Gait abnormality 02/04/2019   ??? Orthostatic hypotension 01/31/2019     ??? Gait abnormality 02/22/19    ??? Impaired mobility/ADLs 02/22/19    ??? Impaired transfers 02/22/19    ??? 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 male admitted to [facility] on 02/21/2019 with the following issues:    Stroke R superior cerebellar infarct    Recommendations:  Post-acute care rehabilitation needs: acute inpatient rehabilitation    Patient???s medical complexity (new stroke) warrants daily physician oversight and functional goals (needs to be at least supervision with spouse out of home working full time) consistent with intensive rehabilitation in acute inpatient rehabilitation.     Impairments: cognitive impairments, dysarthria, loss of coordination and weakness Activity Limitations:  grooming, bathing, dressing - lower, toileting, transfers, ambulation and stairs  Participation Restrictions: unable to return home safely  Family / Patient Dispositional Goals: return home to previous level of function  ???  Overall Functional Goals  Gait and mobility Supervision   Transfers Supervision   ADLs Supervision   Cognition / Communication Supervision       Barriers/Facilitators:  Barriers: Caregiver apprehension, Financial resources and High burden of care  Facilitators: good home setup, controlled pain, treated depression / anxiety, good family / social support, patient motivation, improving strength / endurance and improving medical condition  Rehabilitation Prognosis: Fair to good  Tolerance for three hours of therapy a day: Good  ???  ???  PT, OT, SLP  consulted to address deficits as below     Impaired gait/mobility/balance:  PTA pt was independent at community level without assistive device  Will benefit from continued work with PT to address mobility deficits     Impaired ADL:  PTA pt was independent  Will benefit from ongoing OT to address functional deficits  ???  Dysarthria  May benefit from SLP eval while at next level of care for further treatment           Thank you for this consultation.  Please call our consult pager with questions  or concerns.     Sharlotte Alamo Danie Chandler, MD  Rehab Consult Pager:  [phone]    History of Present Illness     Chief complaint: weakness    Hospital Course:   Mitchell Lucero is a 68 y.o. male, with PMH listed below notable for hypertension, diabetes type 2, C6-C7 ACDF in 1997 and orthostasis who presented to [facility] hospital for a transient episode of dysarthria, right arm irregular involuntary movement with altered mental status bilateral lower extremity shaking/weaknesses in the morning of January 31, 2019 in the setting of recent left pontine stroke in 12/18/2018. ???He was treated at [facility]. ???He was transferred to [facility], he had a work-up for seizures, EEG was negative neurology differential included underlying MSA or other parkinsonism type disorders. ???He was admitted to [facility] 7/24. He was making good progress and on track for 8/11 discharge when on 8/1 he had decline in neurological status, functional mobility, worsening slurred speech, lethargic, still able to answer questions, oriented, but actively vomiting, nauseated, unable to track external ocular motions with the left eye as well as he had in the past, and with equivocal left-sided pronator drift but worsening tremors. ???He was transferred back to acute for workup, felt to be toxic or metabolic vs infectious. ???    Prior to admission, he lived with his wife and mother in [place], [place] in a home with no stairs to enter. Since CVA, he has been mod-I with [facility] for mobility and independent for ADLs. There are chairlifts throughout to bring him between levels.  He states that he was not very active prior to admission, spending most of his days simply watching television with his disabled mother.        Pt is working with PT and OT to address functional and mobility deficits, and rehab medicine is now consulted for post-acute rehab/placement recommendations.    Patient denies F/C/N/V/D/SOB/CP.     Medical History:   Diagnosis Date   ??? DM (diabetes mellitus) (HCC)    ??? Hypertension    ??? TIA (transient ischemic attack)    ??? Tremors of nervous system    ??? Weight loss         Surgical History:   Procedure Laterality Date   ??? HX BACK SURGERY      C6-7 ACDF (cervical)        Social History     Socioeconomic History   ??? Marital status: Married     Spouse name: [name]   ??? Number of children: 3   ??? Years of education: Not on file   ??? Highest education level: Not on file   Occupational History   ??? Not on file   Tobacco Use   ??? Smoking status: Never Smoker   ??? Smokeless tobacco: Former Neurosurgeon Types: Chew   Substance and Sexual Activity   ??? Alcohol use: Yes     Comment: occasional   ??? Drug use: Never   ??? Sexual activity: Not on file   Other Topics Concern   ??? Not on file   Social History Narrative    - Anterior cervical discectomy and fusion in 1997    - Used to work building metal tanks prior to retiring     Family History   Family history unknown: Yes     Family history: reviewed and noncontributory to current condition      Scheduled Meds:aspirin EC tablet 81 mg, 81 mg, Oral, QAM8  atorvastatin (LIPITOR) tablet 80 mg, 80 mg, Oral, QHS  citalopram (CeleXA) tablet 20 mg, 20 mg, Oral, QAM8  clopiDOGrel (PLAVIX) tablet 75 mg, 75 mg, Oral, QDAY  docusate (COLACE) capsule 100 mg, 100 mg, Oral, BID  enoxaparin (LOVENOX) syringe 40 mg, 40 mg, Subcutaneous, QDAY  lactulose oral solution 20 g, 20 g, Oral, TID  latanoprost (XALATAN) 0.005 % ophthalmic solution 1 drop, 1 drop, Both Eyes, QHS  melatonin tablet 3 mg, 3 mg, Oral, QHS  midodrine (PROAMATINE) tablet 2.5 mg, 2.5 mg, Oral, TID  pantoprazole DR (PROTONIX) tablet 20 mg, 20 mg, Oral, QDAY    Continuous Infusions:  PRN and Respiratory Meds:acetaminophen Q4H PRN       No Known Allergies    Prior Level of Function     Self-Care/ADLs:  Required assist  Mobility: required assistance at community level with assistive device     Home Environment:  Home Situation: Lives with Family (Spouse and mother) (02/22/2019  2:00 PM)    Patient Owned Equipment: Nurse, adult (02/22/2019  2:00 PM)    Type of Home: House (Split-level) (02/22/2019  2:00 PM)    Entry Stairs: Stair Glide (02/22/2019  2:00 PM)    In-Home Stairs: Stair Glide (02/22/2019  2:00 PM)    Comments: PT familiar with patient from previous admission, after which patient discharged to [facility] prior to returning to acute setting with new cerebellar infarct. Patient was about to discharge home from [facility] prior to onset of new CVA. Patient visibly and very understandably disappointed by these recent events. Both patient and spouse very pleasant and agreeable to work with PT this afternoon.  (02/22/2019  2:00 PM)    Bathroom Equipment: Binnie Rail in [employer];Shower Chair (02/22/2019  1:00 PM)      Current Level Of Function:   PT Gait Distance: 5 feet Gait: Assistance Level: Moderate Assist, x2 People, Safety Considerations Gait: Assistive Device: Nurse, adult, Wheelchair Follow  Bed Mobility/Transfers  Comments: Patient up in bedside chair at beginning and end of session. Bed mobility not assessed this date.   Transfer Type: Sit to Stand  Transfer: Assistance Level: From, Bed Side Chair, Moderate Assist  Transfer: Assistive Device: Nurse, adult  Transfers: Type Of Assistance: For Safety Considerations, For Strength Deficit, For Balance, Verbal Cues  Other Transfer Type: Sit to/from Stand  Other Transfer: Assistance Level: To/From, Bed Side Chair, Moderate Assist  Other Transfer: Assistive Device: Nurse, adult  Other Transfer: Type Of Assistance: For Safety Considerations, For Strength Deficit, For Balance, Verbal Cues  End Of Activity Status: Up in Chair, Nursing Notified, Instructed Patient to Request Assist with Mobility, Instructed Patient to Use Call Light  Comments: Requires verbal cues for hand placement on armrest of chair prior to standing. Once standing, patient initially slightly retrouplsive on heels, but able to weight shift forward without major issue.      OT ADL's  Where Assessed: Edge of Bed  LE Dressing Assist: Moderate Assist(to don socks and doff brief)  LE Dressing Deficits: Don/Doff R Sock, Don/Doff L Sock, Thread RLE Into Underwear, Thread LLE Into Underwear, Pull Up Over Hips  Toileting Assist: Minimal Assist  Toileting Deficits: Setup, Steadying, Verbal Cueing, Supervision/Safety, Increased Time To Complete(use of urinal; patient positioned with assist for gown)   SLP SWALLOW EVALUATION SUMMARY  Summary: A clinical swallow evaluation was completed this date. Oral Stage Summary*: PO trials of thin liquids (via tsp/cup), purees, and regular solids provided. Bolus withdraw, transfer, and mastication appeared WNL. No anterior bolus loss  nor oral residue noted.  Pharyngeal Stage Summary*: Suspect adeqate timing of the oropharyngeal swallow w/ adequate hyolaryngeal excursion. No s/s of aspiration noted.  Swallow Recommendations*  PO: Regular, Thin Liquids  Swallow Strategies: Small Bites/Sips, Slow Rate of Intake  Plan: Continue Treatment __x/week (Comment).(3-5x)  Prognosis: Good     Review of Systems     A 14 point review of systems was negative except for: that noted in the HPI    Physical Exam     BP: 123/72 (08/11 1349)  Temp: 36.9 ???C (98.4 ???F) (08/11 1349)  Pulse: 82 (08/11 1349)  Respirations: 16 PER MINUTE (08/11 1349)  SpO2: 97 % (08/11 1349)  SpO2 Pulse: 72 (08/10 1730)  Height: 165.1 cm (65) (08/10 2129)  Body mass index is 26.45 kg/m???.     Gen: awake, alert, NAD  HEENT: NCAT, EOMI, MMM  Neck: Supple and symmetric  Heart: Extremities are well perfused  Lungs: respirations even and non-labored  Abdomen: Soft, non-distended  Psych: pleasant mood/appropriate affect  Ext: No c/c/e  MS:   Root Right Left   Shoulder Abduction C5 5 5   Elbow Flexion C5 5 5   Wrist Extension C6 5 5   Elbow Extension C7 5 5   Finger Flexion C8 4 5   Finger Abduction T1 4 5   Hip Flexion L2 4 4   Knee Extension L3 4 4   Dorsiflexion L4 4 4   EHL Extension  L5 4 4   Plantarflexion S1 4 4     Neuro:  Cranial Nerves Cranial Nerves 2-12 are grossly intact with improved tracking with EOMI   DTR's no hyperreflexia   Babinski Downgoing Bilaterally   Hoffman Negative bilaterally   Finger to Nose Bilateral dysmetria worse on right    Heel to Shin Weak but symmetric with mild dysmetria    Rapid Alternating Movements Normal   Upper Extremity Tone Normal   Lower Extremity Tone Normal   Upper Extremity Sensation Intact to light touch bilaterally Lower Extremity Sensation Intact to light touch bilaterally   Clonus Negative Bilaterally   Memory/Concentration  grossly intact, repetition, comprehension and fluency intact       Intake/Output Summary (Last 24 hours) at 02/22/2019 1521  Last data filed at 02/22/2019 1230  Gross per 24 hour   Intake 420 ml   Output ???   Net 420 ml        Hematology:    Lab Results   Component Value Date    HGB 11.8 02/22/2019    HCT 33.8 02/22/2019    PLTCT 123 02/22/2019    WBC 6.8 02/22/2019    NEUT 67 02/22/2019    ANC 4.61 02/22/2019    ALC 1.50 02/22/2019    MONA 5 02/22/2019    AMC 0.32 02/22/2019    ABC 0.06 02/22/2019    MCV 89.9 02/22/2019    MCHC 35.1 02/22/2019    MPV 7.6 02/22/2019    RDW 15.7 02/22/2019   ,   Coagulation:    Lab Results   Component Value Date    INR 1.0 02/21/2019    INR 1.2 02/04/2019      General Chemistry:    Lab Results   Component Value Date    NA 137 02/22/2019    K 3.8 02/22/2019    CL 105 02/22/2019    GAP 6 02/22/2019    BUN 15 02/22/2019    CR 0.78 02/22/2019    GLU 78 02/22/2019  CA 8.2 02/22/2019    ALBUMIN 2.4 02/22/2019    TOTBILI 1.6 02/22/2019        Radiology:    Reviewed    Mri Head Wo Contrast    Result Date: 02/21/2019  MRI head HISTORY: Stroke, suspected, dysarthria, right-sided weakness, left-sided weakness, decreased level of consciousness TECHNIQUE: Multiplanar and multisequence MR imaging of the head was performed.   COMPARISON: Same-day CTA head and neck and CT perfusion, MRI brain 02/01/2019 FINDINGS: Small patchy areas of diffusion restriction within the right superior cerebellum consistent with acute to subacute infarcts. Persistent FLAIR hyperintensity in the left middle cerebellar peduncle and left lateral pons, a portion which anteriorly continues to demonstrate diffusion restriction/reduction. There is no significant associated volume loss, with probable mild associated expansile appearance. Ventricles and subarachnoid spaces otherwise within normal limits. Patchy and confluent FLAIR hyperintensity in the supratentorial white matter and pons consistent with moderate nonspecific white matter disease. Tiny old right cerebellar infarct. There is no midline shift or herniation. Refer to same-day CTA for discussion of the arterial structures. Vascular flow-voids are otherwise grossly unremarkable. No evidence of abnormal intracranial susceptibility. Ocular lens replacements. Trace bilateral mastoid fluid. T1 intermediate to hypointense marrow signal in the upper cervical spine with sparing of the skull base and calvarium, likely due to benign marrow replacement such as red marrow conversion.     1.  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 2.  Similar appearance of mildly expansile FLAIR hyperintensity in the left middle cerebellar peduncle and left pons, a portion of which demonstrates restricted/reduced diffusion. The appearance is not typical for evolving infarct. This finding remains nonspecific and given lack of evolution may represent neoplasm (such as low-grade glioma or possibly anaplastic astrocytoma given diffusion abnormality). Other subacute demyelination or infectious/inflammatory insult could produce this appearance. Short-term follow-up exam in 6-8 weeks without and with contrast recommended for reevaluation. 3.  Persistent moderate nonspecific white matter disease, likely secondary to chronic microvascular ischemia.         Sharlotte Alamo Danie Chandler, MD

## 2019-02-22 NOTE — Rehab Pre-Admission Screening
Physical Medicine & Rehabilitation Pre-Admission Screening       Mitchell Lucero is a 68 y.o.  male.     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 Patient???presented???as a???transfer to [facility]???for transient episode of dysarthria associated with right arm large amplitude,???irregular involuntary movement in both legs shaking yet retained awareness. Patient has been previously having some increasing number of falls at home. Patients hospital course has been complicated by anemia, thrombocytopenia, hyponatremia, as well as impaired mobility and activities of daily living.???    Patient was admitted to [facility] 7/24. He was making good progress and on track for 8/11 discharge when on 8/10 he had decline in neurological status, functional mobility, worsening slurred speech, lethargic, still able to answer questions, oriented, but actively vomiting, nauseated, unable to track external ocular motions with the left eye as well as he had in the past, and with equivocal left-sided pronator drift but worsening tremors. ???He was transferred back to acute for workup, felt to be toxic or metabolic vs infectious. Patient had EEG monitoring while on acute care. Patient had an MRI on 8-10 which showed small acute to subacute infarcts at the right superior cerebellum.    Patient has been working with physical and occupational therapy since 02-22-19. Patient is anticipated to return to home at the supervision level of care.            Prior Level of Function Self-Care/ADLs:???Independent with ADLs and functional transfers  Mobility:???Assist Needed with Mobility-Related ADL's/Ambulation  Work/Personal Responsibilities/Hobbies:??????n/a  ???  Home Environment:  Home Situation: Lives with Family (Spouse and mother) (02/01/2019 10:00 AM)  Patient Owned Equipment: Bernardo Heater (02/01/2019 10:00 AM)  Type of Home: House (Split-level) (02/01/2019 10:00 AM)  Entry Stairs: Stair Glide (02/01/2019 10:00 AM)  In-Home Stairs: Stair Glide (02/01/2019 10:00 AM)  Kriste Basque Equipment: Binnie Rail in [employer];Shower Chair (02/01/2019 ???9:00 AM)  ???  Support System:??????Lives with spouse and mother         Current Level of Function     Physical Therapy: 02-22-19  ???  Strength  Strength Position Assessed: Seated  Overall Strength: Generalized Weakness  Strength Comment: RLE appears weaker compared to LLE, however both major muscle groups notably weaker bilaterally compared to previous admission.   ???  Posture/Neurological  Posture/Neuro Comments: Patient denies changes in hearing or vision since recent infarct. Denies numbness/tingling in UEs/LEs as well.   ???  Bed Mobility/Transfer  Comments: Patient up in bedside chair at beginning and end of session. Bed mobility not assessed this date.   Transfer Type: Sit to Stand  Transfer: Assistance Level: From;Bed Side Chair;Moderate Assist  Transfer: Assistive Device: Nurse, adult  Transfers: Type Of Assistance: For Safety Considerations;For Strength Deficit;For Balance;Verbal Cues  Other Transfer Type: Sit to/from Stand  Other Transfer: Assistance Level: To/From;Bed Side Chair;Moderate Assist  Other Transfer: Assistive Device: Nurse, adult  Other Transfer: Type Of Assistance: For Safety Considerations;For Strength Deficit;For Balance;Verbal Cues  End Of Activity Status: Up in Chair;Nursing Notified;Instructed Patient to Request Assist with Mobility;Instructed Patient to Use Call Light  Comments: Requires verbal cues for hand placement on armrest of chair prior to standing. Once standing, patient initially slightly retrouplsive on heels, but able to weight shift forward without major issue.   ???  Gait  Gait Distance: 5 feet  Gait: Assistance Level: Moderate Assist;x2 People;Safety Considerations  Gait: Assistive Device: Nurse, adult;Wheelchair Follow  Gait: Descriptors: Decreased step length;Variable step length;Decreased foot clearance RLE  Comments: Significant verbal cueing provided for weight shifting to advance contralateral limb this date. PT then trialed vertical mirror for visual feedback for the patient and encouraged wider base of support and even step length prior to advancing the RW. Patient requires 2 bouts with seated rest to complete 5 feet of gait this afternoon.   Activity Limited By: Weakness    Occupational Therapy: 02-22-19  Vision  Current Vision: Wears Glasses All of the Time  Comment: Functional vision assessment only; [facility] for ADLs. Pt closing eyes upon arrival to chair. Reports he feels like he's spinning.   ???  ADL's  Where Assessed: Edge of Bed  LE Dressing Assist: Moderate Assist (to don socks; to doff brief)  LE Dressing Deficits: Don/Doff R Sock;Don/Doff L Sock;Thread RLE Into Underwear;Thread LLE Into Underwear;Pull Up Over Hips  Toileting Assist: Minimal Assist  Toileting Deficits: Setup;Steadying;Verbal Cueing;Supervision/Safety;Increased Time To Complete (use of urinal)  Comments: RUE coordination impairment noted during dressing tasks. Patient required minimal assist for static sitting (unsupported) and standing balance with walker d/t truncal ataxia. Patient required assist for hip hike during brief change and assist for gown management while he positioned urinal with increased time at EOB using BUEs.   ???  ADL Mobility  Bed Mobility: Supine to Sit: Minimal assist  Bed Mobility Comments: HOB elevated;bed rail;hand hold assist  Transfer Type: Sit to/from stand  Transfer: Assistance Level: Minimal assist Transfer: Assistive Device: Roller walker  Transfer: Type of Assistance: For safety considerations;For balance;For strength deficit  Sitting Balance: Minimal assist;1 UE support;2 UE support  Standing Balance: Minimal assist;2 UE support  Gait Distance: 2 feet  Gait: Assistance Level: Minimal assist;Moderate assist  Gait: Assistive Device: Roller walker  Gait Comments: Truncal and RUE ataxia/incoordination. Required increased time to take side steps from bed to chair (decreased foot clearance  ???  Cognition  Overall Cognitive Status: Impaired  Expression: Increased Time for Expression;Mumbling/Slurred  Social Interaction: Interacts in a Network engineer;Increased Time to Adjust  Problem Solving: Cueing to Sequence Task;Decreased Judgment/Safety  Attention: Awake/Alert  Cognition Comment: Limited cognition assessment. Patient followed directions/commands. Spouse often answered questions before patient during my evaluation.   ???  UE AROM  Overall BUE AROM WNL: No ; RUE incoordination/ ataxia  Coordination: LUE WFL; RUE Mild-moderate delay  Grasp: Bilateral Grasp Functional for Activity; Weaker on R  ???  UE Strength / Tone  Overall Strength / Tone: WFL for ADLs; 4+/5,5/5 on L, 4/5,4+/5 on R    Speech Therapy: 02-22-19  EVALUATION SUMMARY  Summary: A clinical swallow evaluation was completed this date. Results suggest swallow WFL. No s/s of aspiration noted with PO trials. Pt/spouse deny any hx of dysphagia. Mild dysarthria present, which pt/spouse report has improved over the last day. Reviewed results/recommendations. Discussed s/s of aspiration and to notify staff should they occur. Pt was receiving SLP services on rehabilitation unit for cognitive therapy only. Will continue to follow, please see details below.   ???  RECOMMENDATIONS  Regular solids  and thin liquids  Medications PO as tolerated  Excellent oral care (2-3x daily) to reduce risk of aspiration of bacteria from the oral cavity SLP department will f/u to ensure diet tolerance and further evaluation as indicated         Rehabilitation Plan     Rehab Diagnosis and conditions that require Rehabilitation:      stroke???  Cognitive impairment and Weakness       Active Comorbidities/Risk of Medical Complications:      1. 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 Patient admitted for??????transient episode of dysarthria associated with right arm large amplitude,???irregular involuntary movement in both legs shaking yet retained awareness.  2. Cognitive Impairment:???Patient with cognitive impairment. ???May need 24 hour supervision upon discharge home. Patient will have a formal cognitive evaluation while on inpatient rehab.???  3. Anemia:???Will need to monitor and manage, as this may negatively impact patient's endurance with therapy.  4. Diabetes: Due to history of DM2 there is a risk of hypo/hyperglycemia during mobilization as we titrate new and home insulin and diabetes medications.???Patient is at risk for hyper/hypoglycemia during aggressive mobilization with therapies, and will need daily physician monitoring and adjustment of medications. ???Patient may benefit from ongoing education from diabetes nurse educator for healthy lifestyle modifications.  5. Falls:??????Patient will be placed close to the nurse's station for closer monitoring. Staff will round on the patient frequently to assess for any needs, such as using the restroom to help prevent falls as this increases the patient's risk for morbidity/mortality and 30 day readmission to the hospital rate.???  6. Hyperlipidemia:???Will continue to monitor and manage, and adjust medications as needed.  7. Hyponatremia:???Patient is at risk for worsening electrolyte imbalance and will require physician monitoring of labs and adjustments to medications and fluid intake. ???Will continue to monitor and manage, as this may affect patient's cognitive status. 8. Risk for Thrombosis:???Patient has Lovenox, sequential compression devices ordered. ???Patient will be encouraged to ambulate frequently with the assistance of health care provider.  9. Thrombocytopenia:??????Patient is at risk for bleeding and needs daily physician monitoring of labs and blood loss prevention.             Patient will receive???Physical therapy, Occupational therapy and Speech therapy???each???60 minutes a day, 5 days a week???for a total of 3 hours daily (minimum)???for the duration of the rehabilitation stay within an interdisciplinary rehabilitation program with case manager/social worker, dietician, neuropsychologist, rehab nursing and PM&R oversight.  ???  Rehabilitation Prognosis:???Fair???to good  Medical Prognosis: The patient is deemed medically stable to tolerate, benefit from, and participate in IRF level???services.  Tolerance for three hours of therapy a day:???Good.???Patient has participated well with therapies since???02-22-19???in the acute care setting and is anticipated to tolerate 3 hours of therapy per day, as required.   ???  ???  Goals/Barriers/Facilitators  Family / Patient Goals:???return home to previous level of function  Mobility Goals:???Overall goal is???Modified independent???and Physical Therapy will evaluate and treat ambulation/wheelchair use and bed transfers  Activities of Daily Living (ADLs) Goals:???Overall goal is???Modified independent???and Occupational therapy will evaluate and treat basic Activities of Daily Living  Cognition / Communication Goals:???Overall goal is???Supervision???and Speech therapy will evaluate and treat cognition and communication deficits and assess for safe swallow            Barriers & Interventions:???  Caregiver Apprehension:???Arrange caregiver support and discuss barriers and patient progress with caregivers when appropriate.  High Burden of Care:???Initiate interdisciplinary rehabilitation to improve functional independence and reduce burden of care.  Medication Education:??????Pharmacist and nursing staff to provide education to patient and family regarding medication side effects, special precautions, and safe administration.  Facilitators:???good home setup, good family / social support, patient motivation, improving strength / endurance and improving medical condition  ???  Discharge Planning  Expected Length of Stay???14???day(s)  Expected Discharge Disposition???Home  Expected Discharge Needs:???Patient would benefit from ongoing???physical, occupational and speech therapy???at the outpatient???level of care. Patient already owned a stair glide, grab bars in shower, shower chair and a walker???prior to hospitalization; however physical and occupational therapy will determine most appropriate equipment needs???upon discharge to home to improve upon safety and decrease risk of falls.            Romie Minus, BSN, RN

## 2019-02-22 NOTE — Progress Notes
OCCUPATIONAL THERAPY  ASSESSMENT NOTE      Name: Mitchell Lucero        MRN: [id number]          DOB: 21-Feb-1951          Age: 68 y.o.  Admission Date: 02/21/2019             LOS: 0 days      Mobility  Progressive Mobility Level: Active transfer to chair  Distance Walked (feet): 2 ft  Level of Assistance: Assist X1  Assistive Device: Walker  Time Tolerated: 11-30 minutes  Activity Limited By: Dizziness;Weakness; Ataxia    Subjective  Pertinent Dx per Physician: Pt with hx of recent Left pontine stroke admitted from [employer] [facility] with acute R superior cerebellar infarct  Precautions: Falls;Seizure Precautions(abdominal binder, compression stockings)  Pain / Complaints: Patient agrees to participate in therapy  Comments: Patient in bed upon therapist entry, reports need to use the bathroom. Spoke with RN before and after visit. Patient in chair with LEs reclined, needs in reach, precautions in place and providers at bedside at end of visit.     Objective  Psychosocial Status: Willing and Cooperative to Participate  Persons Present: Spouse      Home Living  Type of Home: House  Home Layout: Two Level  Financial risk analyst / Tub: Pension scheme manager: Midwife: Engineer, materials in Air traffic controller;Shower Chair  Home Equipment: Dan Humphreys  Comment: Patient admitted from [facility]. Planning to d/c home today prior to new stroke.    Prior Function  Level Of Independence: Independent with ADLs and functional transfers  Lives With: Spouse  Receives Help From: Family  Vocational: On Disability  Other Function Comments:Spouse works. Patient/spouse indicate fall history - spouse reports 18+ falls  Comments (8/11): Per patient and spouse, Cederic was planning to d/c home from IPR today prior to hospital re-admission. Upon admission, discovered patient presents with acute R superior cerebellar infarct and corresponding new functional deficits. At [facility], prior to re-admission, patient was completing dressing tasks with setup and walking with SBA-CGA with use of walker.     Vision  Current Vision: Wears Glasses All of the Time  Comment: Functional vision assessment only; [facility] for ADLs. Pt closing eyes upon arrival to chair. Reports he feels like he's spinning.     ADL's  Where Assessed: Edge of Bed  LE Dressing Assist: Moderate Assist (to don socks; to doff brief)  LE Dressing Deficits: Don/Doff R Sock;Don/Doff L Sock;Thread RLE Into Underwear;Thread LLE Into Underwear;Pull Up Over Hips  Toileting Assist: Minimal Assist  Toileting Deficits: Setup;Steadying;Verbal Cueing;Supervision/Safety;Increased Time To Complete (use of urinal)  Comments: RUE coordination impairment noted during dressing tasks. Patient required minimal assist for static sitting (unsupported) and standing balance with walker d/t truncal ataxia. Patient required assist for hip hike during brief change and assist for gown management while he positioned urinal with increased time at EOB using BUEs.     ADL Mobility  Bed Mobility: Supine to Sit: Minimal assist  Bed Mobility Comments: HOB elevated;bed rail;hand hold assist  Transfer Type: Sit to/from stand  Transfer: Assistance Level: Minimal assist  Transfer: Assistive Device: Roller walker  Transfer: Type of Assistance: For safety considerations;For balance;For strength deficit  Sitting Balance: Minimal assist;1 UE support;2 UE support  Standing Balance: Minimal assist;2 UE support  Gait Distance: 2 feet  Gait: Assistance Level: Minimal assist;Moderate assist  Gait: Assistive Device: Roller walker  Gait Comments: Truncal and RUE ataxia/incoordination. Required  increased time to take side steps from bed to chair (decreased foot clearance    Activity Tolerance  Endurance: 3/5 Tolerates 25-30 Minutes Exercise w/Multiple Rests   Comments: Patient c/o dizziness/feeling like he was spinning upon standing during two trials. Compression and abdominal binder donned. Denied nausea. Cognition  Overall Cognitive Status: Impaired  Expression: Increased Time for Expression;Mumbling/Slurred  Social Interaction: Interacts in a Network engineer;Increased Time to Adjust  Problem Solving: Cueing to Sequence Task;Decreased Judgment/Safety  Attention: Awake/Alert  Cognition Comment: Limited cognition assessment. Patient followed directions/commands. Spouse often answered questions before patient during my evaluation.     UE AROM  Overall BUE AROM WNL: No ; RUE incoordination/ ataxia  Coordination: LUE WFL; RUE Mild-moderate delay  Grasp: Bilateral Grasp Functional for Activity; Weaker on R    UE Strength / Tone  Overall Strength / Tone: WFL for ADLs; 4+/5,5/5 on L, 4/5,4+/5 on R    Education  Persons Educated: Patient/Family  Goal Formulation: With Patient/Family    Assessment  Assessment: Decreased ADL Status;Decreased Safe/Judg during ADL;Decreased Cognition;Decreased Endurance;Decreased Self-Care Trans;Decreased High-Level ADLs  Prognosis: Good;w/Cont OT s/p Acute Discharge  Goal Formulation: Patient  Comments: Patient's current functional status appears impaired from reported new baseline (at [facility]) just prior to admission/ status change. Patient limited by new truncal and RUE ataxia, slight intermittent confusion and speech differences. Currently, he requires hands-on assistance for all functional transfers and MRADLs with walker. Pending acute progress, would likely benefit from more intensive inpatient rehab to address new deficits prior to home.     Plan  OT Frequency: 5x/week  OT Plan for Next Visit: Standing at sink for grooming; LE dressing at EOB    ADL Goals  Patient Will Perform All ADL's: w/ Stand By Assist  Patient Will Perform Grooming: w/ Stand By Assist  Patient Will Perform LE Dressing: w/ Stand By Assist  Patient Will Perform Toileting: w/ Stand By Assist    Functional Transfer Goals  Pt Will Perform All Functional Transfers: w/ Stand By Assist OT Discharge Recommendations  Recommendation: Inpatient setting;Recommend rehab medicine consult(Return to I[facility])  Patient Currently Requires Physical Assist With: All mobility;All personal care ADLs;All home functioning ADLs   Comments: Aware that patient's first preference is to d/c home, however, he currently requires min-mod physical assistance for transfers and ADLs d/t new functional deficits. He will likely require intensive intervention in an IPR prior to home d/c if he discharges at this functional level. Will continue to intervene acutely and update recommendations as appropriate.     Therapist: Darlin Priestly, OTR/L [phone]  Date: 02/22/2019

## 2019-02-23 ENCOUNTER — Observation Stay: Admit: 2019-02-21 | Discharge: 2019-02-21

## 2019-02-23 DIAGNOSIS — I6350 Cerebral infarction due to unspecified occlusion or stenosis of unspecified cerebral artery: Secondary | ICD-10-CM

## 2019-02-23 DIAGNOSIS — I69322 Dysarthria following cerebral infarction: Secondary | ICD-10-CM

## 2019-02-23 DIAGNOSIS — I639 Cerebral infarction, unspecified: Secondary | ICD-10-CM

## 2019-02-23 LAB — BASIC METABOLIC PANEL: Lab: 139 MMOL/L — ABNORMAL LOW (ref 137–147)

## 2019-02-23 LAB — HEMOGLOBIN A1C: Lab: 6.3 % — ABNORMAL HIGH (ref 4.0–6.0)

## 2019-02-23 LAB — CBC: Lab: 6.2 K/UL — ABNORMAL LOW (ref 4.5–11.0)

## 2019-02-23 MED ORDER — CITALOPRAM 20 MG PO TAB
20 mg | Freq: Every morning | ORAL | 0 refills | Status: DC
Start: 2019-02-23 — End: 2019-03-09
  Administered 2019-02-24 – 2019-03-09 (×14): 20 mg via ORAL

## 2019-02-23 MED ORDER — ENOXAPARIN 40 MG/0.4 ML SC SYRG
40 mg | Freq: Every day | SUBCUTANEOUS | 0 refills | Status: DC
Start: 2019-02-23 — End: 2019-03-09
  Administered 2019-02-24 – 2019-03-09 (×14): 40 mg via SUBCUTANEOUS

## 2019-02-23 MED ORDER — DOCUSATE SODIUM 100 MG PO CAP
100 mg | Freq: Two times a day (BID) | ORAL | 0 refills | Status: CN
Start: 2019-02-23 — End: ?

## 2019-02-23 MED ORDER — ATORVASTATIN 40 MG PO TAB
80 mg | Freq: Every evening | ORAL | 0 refills | Status: CN
Start: 2019-02-23 — End: ?

## 2019-02-23 MED ORDER — MELATONIN 3 MG PO TAB
3 mg | Freq: Every evening | ORAL | 0 refills | Status: CN
Start: 2019-02-23 — End: ?

## 2019-02-23 MED ORDER — MIDODRINE 5 MG PO TAB
5 mg | Freq: Three times a day (TID) | ORAL | 0 refills | Status: DC
Start: 2019-02-23 — End: 2019-03-09
  Administered 2019-02-23 – 2019-03-09 (×39): 5 mg via ORAL

## 2019-02-23 MED ORDER — MELATONIN 3 MG PO TAB
3 mg | Freq: Every evening | ORAL | 0 refills | Status: DC
Start: 2019-02-23 — End: 2019-03-09
  Administered 2019-02-24 – 2019-03-09 (×6): 3 mg via ORAL

## 2019-02-23 MED ORDER — CLOPIDOGREL 75 MG PO TAB
75 mg | Freq: Every day | ORAL | 0 refills | Status: CN
Start: 2019-02-23 — End: ?

## 2019-02-23 MED ORDER — PANTOPRAZOLE 20 MG PO TBEC
20 mg | Freq: Every day | ORAL | 0 refills | Status: DC
Start: 2019-02-23 — End: 2019-03-09
  Administered 2019-02-24 – 2019-03-09 (×13): 20 mg via ORAL

## 2019-02-23 MED ORDER — MIDODRINE 5 MG PO TAB
5 mg | Freq: Three times a day (TID) | ORAL | 0 refills | Status: CN
Start: 2019-02-23 — End: ?

## 2019-02-23 MED ORDER — LATANOPROST 0.005 % OP DROP
1 [drp] | Freq: Every evening | OPHTHALMIC | 0 refills | Status: CN
Start: 2019-02-23 — End: ?

## 2019-02-23 MED ORDER — ASPIRIN 81 MG PO TBEC
81 mg | Freq: Every morning | ORAL | 0 refills | Status: CN
Start: 2019-02-23 — End: ?

## 2019-02-23 MED ORDER — ACETAMINOPHEN 325 MG PO TAB
650 mg | ORAL | 0 refills | Status: DC | PRN
Start: 2019-02-23 — End: 2019-03-09
  Administered 2019-03-06: 23:00:00 650 mg via ORAL

## 2019-02-23 MED ORDER — ONDANSETRON HCL 4 MG PO TAB
4 mg | ORAL | 0 refills | Status: DC | PRN
Start: 2019-02-23 — End: 2019-02-25
  Administered 2019-02-24 (×2): 4 mg via ORAL

## 2019-02-23 MED ORDER — ENOXAPARIN 40 MG/0.4 ML SC SYRG
40 mg | Freq: Every day | SUBCUTANEOUS | 0 refills | Status: CN
Start: 2019-02-23 — End: ?

## 2019-02-23 MED ORDER — LACTULOSE 10 GRAM/15 ML PO SOLN WRAPPER
20 g | Freq: Three times a day (TID) | ORAL | 0 refills | Status: DC
Start: 2019-02-23 — End: 2019-02-28
  Administered 2019-02-23 – 2019-02-27 (×11): 20 g via ORAL

## 2019-02-23 MED ORDER — ATORVASTATIN 40 MG PO TAB
80 mg | Freq: Every evening | ORAL | 0 refills | Status: DC
Start: 2019-02-23 — End: 2019-03-09
  Administered 2019-02-24 – 2019-03-09 (×14): 80 mg via ORAL

## 2019-02-23 MED ORDER — LATANOPROST 0.005 % OP DROP
1 [drp] | Freq: Every evening | OPHTHALMIC | 0 refills | Status: DC
Start: 2019-02-23 — End: 2019-02-24

## 2019-02-23 MED ORDER — MELATONIN 3 MG PO TAB
3 mg | Freq: Every evening | ORAL | 0 refills | Status: DC
Start: 2019-02-23 — End: 2019-02-23

## 2019-02-23 MED ORDER — ALUMINUM-MAGNESIUM HYDROXIDE 200-200 MG/5 ML PO SUSP
30 mL | ORAL | 0 refills | Status: DC | PRN
Start: 2019-02-23 — End: 2019-03-09

## 2019-02-23 MED ORDER — DOCUSATE SODIUM 100 MG PO CAP
100 mg | Freq: Two times a day (BID) | ORAL | 0 refills | Status: DC
Start: 2019-02-23 — End: 2019-03-09
  Administered 2019-02-24 – 2019-03-09 (×28): 100 mg via ORAL

## 2019-02-23 MED ORDER — BISACODYL 10 MG RE SUPP
10 mg | Freq: Every day | RECTAL | 0 refills | Status: DC | PRN
Start: 2019-02-23 — End: 2019-03-09

## 2019-02-23 MED ORDER — SENNOSIDES 8.6 MG PO TAB
2 | Freq: Every evening | ORAL | 0 refills | Status: DC
Start: 2019-02-23 — End: 2019-03-09
  Administered 2019-02-24 – 2019-03-09 (×14): 2 via ORAL

## 2019-02-23 MED ORDER — DOCUSATE SODIUM 100 MG PO CAP
100 mg | Freq: Two times a day (BID) | ORAL | 0 refills | Status: DC
Start: 2019-02-23 — End: 2019-02-23

## 2019-02-23 MED ORDER — MAGNESIUM HYDROXIDE 2,400 MG/10 ML PO SUSP
10 mL | ORAL | 0 refills | Status: DC | PRN
Start: 2019-02-23 — End: 2019-03-09
  Administered 2019-03-05 (×3): 10 mL via ORAL

## 2019-02-23 MED ORDER — INSULIN ASPART 100 UNIT/ML SC FLEXPEN
0-12 [IU] | Freq: Before meals | SUBCUTANEOUS | 0 refills | Status: DC
Start: 2019-02-23 — End: 2019-03-09
  Administered 2019-02-24: 17:00:00 4 [IU] via SUBCUTANEOUS

## 2019-02-23 MED ORDER — TRAZODONE 50 MG PO TAB
50 mg | Freq: Every evening | ORAL | 0 refills | Status: DC | PRN
Start: 2019-02-23 — End: 2019-03-09
  Administered 2019-02-24: 01:00:00 50 mg via ORAL

## 2019-02-23 MED ORDER — INSULIN GLARGINE 100 UNIT/ML (3 ML) SC INJ PEN
8 [IU] | Freq: Every evening | SUBCUTANEOUS | 0 refills | Status: DC
Start: 2019-02-23 — End: 2019-03-01
  Administered 2019-02-24: 03:00:00 8 [IU] via SUBCUTANEOUS

## 2019-02-23 MED ORDER — ASPIRIN 81 MG PO TBEC
81 mg | Freq: Every morning | ORAL | 0 refills | Status: DC
Start: 2019-02-23 — End: 2019-03-09
  Administered 2019-02-24 – 2019-03-09 (×15): 81 mg via ORAL

## 2019-02-23 MED ORDER — CLOPIDOGREL 75 MG PO TAB
75 mg | Freq: Every day | ORAL | 0 refills | Status: DC
Start: 2019-02-23 — End: 2019-03-09
  Administered 2019-02-24 – 2019-03-09 (×14): 75 mg via ORAL

## 2019-02-23 MED ORDER — CITALOPRAM 20 MG PO TAB
20 mg | Freq: Every morning | ORAL | 0 refills | Status: CN
Start: 2019-02-23 — End: ?

## 2019-02-23 MED ORDER — LACTULOSE 10 GRAM/15 ML PO SOLN WRAPPER
20 g | Freq: Three times a day (TID) | ORAL | 0 refills | Status: CN
Start: 2019-02-23 — End: ?

## 2019-02-23 MED ORDER — MIDODRINE 2.5 MG PO TAB
5 mg | ORAL_TABLET | Freq: Three times a day (TID) | ORAL | 1 refills | 30.00000 days | Status: DC
Start: 2019-02-23 — End: 2019-03-09

## 2019-02-23 MED ORDER — PANTOPRAZOLE 20 MG PO TBEC
20 mg | Freq: Every day | ORAL | 0 refills | Status: CN
Start: 2019-02-23 — End: ?

## 2019-02-23 NOTE — Progress Notes
SPEECH-LANGUAGE PATHOLOGY  DAILY TREATMENT NOTE      Patient seen 1x this date.  Documentation reflects all daily treatment sessions.    SUMMARY OF THERAPY SESSION: F/u this date for ongoing dysphagia management. Pt/RN/spouse report good tolerance of diet without s/s of aspiration. Spouse reports pt w/ baseline cough though did not appear to be in relation to PO intake. At least mild dysarthria remains. Initiated discussion of motor speech strategies to improve intelligibility (slow rate and overexaggeration of articulators). Reviewed results/recommendations below. Encouraged pt/spouse to notify staff should s/s of dysphagia occur. Will continue to follow, please see details below.    RECOMMENDATIONS  Regular solids and thin liquids  Medications PO as tolerated  Excellent oral care (2-3x daily) to reduce risk of aspiration of bacteria from the oral cavity  SLP services at next level of care      Goal : Pt will tolerate regular solids and thin liquids with less than 10% s/s of aspiration and negative pulmonary status changes.  Met  Comment: See summary above.   Continue goal at this level to ensure accuracy     NEW GOAL: Pt will participate in ongoing education re: motor speech strategies given mild cues.      PLAN / RECOMMENDATIONS:  Continue treatment 2-3x/week and Patient would benefit from further speech therapy post acute hospitalization.        Therapist: Emeterio Reeve M.A. CCC-L/SLP Voalte: [id number]  Date: 02/23/2019

## 2019-02-23 NOTE — Progress Notes
Patient arrived to room # (2226*) via cart accompanied by transport. Patient transferred to the chair without assistance. Bedside safety checks completed. Initial patient assessment completed. Refer to flowsheet for details.    Admission skin assessment completed with: Michaela, RN    Pressure injury present on arrival?: No    1. Head/Face/Neck: No  2. Trunk/Back: No  3. Upper Extremities: No  4. Lower Extremities: No  5. Pelvic/Coccyx: No  6. Assessed for device associated injury? Yes  7. Malnutrition Screening Tool (Nursing Nutrition Assessment) Completed? Yes    See Doc Flowsheet for additional wound details.     INTERVENTIONS:

## 2019-02-23 NOTE — Rehab Pre-Admission Screening
Physical Medicine & Rehabilitation Pre-Admission Screening       Mitchell Lucero is a 68 y.o.  male.     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 Patient???presented???as a???transfer to [facility]???for transient episode of dysarthria associated with right arm large amplitude,???irregular involuntary movement in both legs shaking yet retained awareness. Patient has been previously having some increasing number of falls at home. Patients hospital course has been complicated by anemia, thrombocytopenia, hyponatremia, as well as impaired mobility and activities of daily living.???  ???  Patient was admitted to[facility]7/[facility]. He was making good progress and on track for 8/11 discharge when on 8/10 he had???decline in neurological status, functional mobility, worsening slurred speech, lethargic, still able to answer questions, oriented, but actively vomiting, nauseated, unable to track external ocular motions with the left eye as well as he had in the past, and with equivocal left-sided pronator drift but worsening tremors. ???He was transferred back to acute for workup, felt to be toxic or metabolic vs infectious. Patient had EEG monitoring while on acute care. Patient had an MRI on 8-10 which showed small acute to subacute infarcts at the right superior cerebellum.  ???  Patient has been working with physical and occupational therapy since 02-22-19. Patient is anticipated to return to home at the supervision level of care.         Prior Level of Function Self-Care/ADLs:???Independent with ADLs and functional transfers  Mobility:???Assist Needed with Mobility-Related ADL's/Ambulation  Work/Personal Responsibilities/Hobbies:??????n/a  ???  Home Environment:  Home Situation: Lives with Family (Spouse and mother) (02/01/2019 10:00 AM)  Patient Owned Equipment: Bernardo Heater (02/01/2019 10:00 AM)  Type of Home: House (Split-level) (02/01/2019 10:00 AM)  Entry Stairs: Stair Glide (02/01/2019 10:00 AM)  In-Home Stairs: Stair Glide (02/01/2019 10:00 AM)  Kriste Basque Equipment: Binnie Rail in [employer];Shower Chair (02/01/2019 ???9:00 AM)  ???  Support System:??????Lives with spouse and mother       Current Level of Function     Physical Therapy: 02-22-19  Strength  Strength Position Assessed: Seated  Overall Strength: Generalized Weakness  Strength Comment: RLE appears weaker compared to LLE, however both major muscle groups notably weaker bilaterally compared to previous admission.???  ???  Posture/Neurological  Posture/Neuro Comments: Patient denies changes in hearing or vision since recent infarct. Denies numbness/tingling in UEs/LEs as well.???  ???  Bed Mobility/Transfer  Comments: Patient up in bedside chair at beginning and end of session. Bed mobility not assessed this date.   Transfer Type: Sit to Stand  Transfer: Assistance Level: From;Bed Side Chair;Moderate Assist  Transfer: Assistive Device: Nurse, adult  Transfers: Type Of Assistance: For Safety Considerations;For Strength Deficit;For Balance;Verbal Cues  Other Transfer Type: Sit to/from Stand  Other Transfer: Assistance Level: To/From;Bed Side Chair;Moderate Assist  Other Transfer: Assistive Device: Nurse, adult  Other Transfer: Type Of Assistance: For Safety Considerations;For Strength Deficit;For Balance;Verbal Cues  End Of Activity Status: Up in Chair;Nursing Notified;Instructed Patient to Request Assist with  Mobility;Instructed Patient to Use Call Light  Comments: Requires verbal cues for hand placement on armrest of chair prior to standing. Once standing, patient initially slightly retrouplsive on heels, but able to weight shift forward without major issue.???  ???  Gait  Gait Distance: 5 feet  Gait: Assistance Level: Moderate Assist;x2 People;Safety Considerations  Gait: Assistive Device: Nurse, adult;Wheelchair Follow  Gait: Descriptors: Decreased step length;Variable step length;Decreased foot clearance RLE  Comments: Significant verbal cueing provided for weight shifting to advance contralateral limb this date. PT then trialed vertical mirror for visual feedback for the patient and encouraged wider base of support and even step length prior to advancing the RW. Patient requires 2 bouts with seated rest to complete 5 feet of gait this afternoon.   Activity Limited By: Weakness  ???  Occupational Therapy: 02-22-19  Vision  Current Vision: Wears Glasses All of the Time  Comment: Functional vision assessment only; [facility] for ADLs. Pt closing eyes upon arrival to chair. Reports he feels like he's spinning.???  ???  ADL's  Where Assessed: Edge of Bed  LE Dressing Assist: Moderate Assist (to don socks; to???doff brief)  LE Dressing Deficits: Don/Doff R Sock;Don/Doff L Sock;Thread RLE Into Underwear;Thread LLE Into Underwear;Pull Up Over Hips  Toileting Assist: Minimal Assist  Toileting Deficits: Setup;Steadying;Verbal Cueing;Supervision/Safety;Increased Time To Complete (use of urinal)  Comments: RUE coordination impairment noted during dressing tasks. Patient required minimal assist for static sitting (unsupported) and standing balance with walker d/t truncal ataxia. Patient required assist for hip hike during brief change and assist for gown management while he positioned urinal with increased time at EOB using BUEs.???  ???  ADL Mobility  Bed Mobility: Supine to Sit: Minimal assist  Bed Mobility Comments: HOB elevated;bed rail;hand hold assist  Transfer Type: Sit to/from stand  Transfer: Assistance Level: Minimal assist Transfer: Assistive Device: Roller walker  Transfer: Type of Assistance: For safety considerations;For balance;For strength deficit  Sitting Balance: Minimal assist;1 UE support;2 UE support  Standing Balance: Minimal assist;2 UE support  Gait Distance: 2 feet  Gait: Assistance Level: Minimal assist;Moderate assist  Gait: Assistive Device: Roller walker  Gait Comments: Truncal and???RUE ataxia/incoordination. Required increased time to take side steps from bed to chair (decreased foot clearance  ???  Cognition  Overall Cognitive Status: Impaired  Expression: Increased Time for Expression;Mumbling/Slurred  Social Interaction: Interacts in a Network engineer;Increased Time to Adjust  Problem Solving: Cueing to Sequence Task;Decreased Judgment/Safety  Attention: Awake/Alert  Cognition Comment: Limited cognition assessment. Patient followed directions/commands. Spouse often answered questions before patient during my evaluation.???  ???  UE AROM  Overall BUE AROM WNL:???No ; RUE incoordination/ ataxia  Coordination:???LUE WFL; RUE Mild-moderate delay  Grasp: Bilateral Grasp Functional for Activity; Weaker on R  ???  UE Strength / Tone  Overall Strength / Tone:???[facility] for ADLs; 4+/5,5/5 on L, 4/5,4+/5 on R  ???  Speech Therapy: 02-22-19  EVALUATION SUMMARY  Summary: A clinical swallow evaluation was completed this date.???Results suggest swallow WFL. No s/s of aspiration noted with PO trials. Pt/spouse deny any hx of dysphagia. Mild dysarthria present, which pt/spouse report has improved over the last day. Reviewed results/recommendations. Discussed s/s of aspiration and to notify staff should they occur. Pt was receiving SLP services on rehabilitation unit for cognitive therapy only. Will continue to follow, please see details below.   ???  RECOMMENDATIONS  Regular solids and thin liquids  Medications PO as tolerated  Excellent oral care (2-3x daily) to reduce risk of aspiration of bacteria from the  oral cavity SLP department will f/u to ensure diet tolerance and further evaluation as indicated   ???        Rehabilitation Plan     Rehab Diagnosis and conditions that require Rehabilitation:      stroke???  Cognitive impairment and Weakness       Active Comorbidities/Risk of Medical Complications:      1. 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 Patient admitted for??????transient episode of dysarthria associated with right arm large amplitude,???irregular involuntary movement in both legs shaking yet retained awareness.  2. Cognitive Impairment:???Patient with cognitive impairment. ???May need 24 hour supervision upon discharge home. Patient will have a formal cognitive evaluation while on inpatient rehab.???  3. Anemia:???Will need to monitor and manage, as this may negatively impact patient's endurance with therapy.  4. Diabetes: Due to history of DM2 there is a risk of hypo/hyperglycemia during mobilization as we titrate new and home insulin and diabetes medications.???Patient is at risk for hyper/hypoglycemia during aggressive mobilization with therapies, and will need daily physician monitoring and adjustment of medications. ???Patient may benefit from ongoing education from diabetes nurse educator for healthy lifestyle modifications.  5. Falls:??????Patient will be placed close to the nurse's station for closer monitoring. Staff will round on the patient frequently to assess for any needs, such as using the restroom to help prevent falls as this increases the patient's risk for morbidity/mortality and 30 day readmission to the hospital rate.???  6. Hyperlipidemia:???Will continue to monitor and manage, and adjust medications as needed.  7. Hyponatremia:???Patient is at risk for worsening electrolyte imbalance and will require physician monitoring of labs and adjustments to medications and fluid intake. ???Will continue to monitor and manage, as this may affect patient's cognitive status. 8. Risk for Thrombosis:???Patient has Lovenox, sequential compression devices ordered. ???Patient will be encouraged to ambulate frequently with the assistance of health care provider.  9. Thrombocytopenia:??????Patient is at risk for bleeding and needs daily physician monitoring of labs and blood loss prevention.             Patient will receive???Physical therapy, Occupational therapy and Speech therapy???each???60 minutes a day, 5 days a week???for a total of 3 hours daily (minimum)???for the duration of the rehabilitation stay within an interdisciplinary rehabilitation program with case manager/social worker, dietician, neuropsychologist, rehab nursing and PM&R oversight.  ???  Rehabilitation Prognosis:???Fair???to good  Medical Prognosis: The patient is deemed medically stable to tolerate, benefit from, and participate in IRF level???services.  Tolerance for three hours of therapy a day:???Good.???Patient has participated well with therapies since???02-22-19???in the acute care setting and is anticipated to tolerate 3 hours of therapy per day, as required.   ???  ???  Goals/Barriers/Facilitators  Family / Patient Goals:???return home to previous level of function  Mobility Goals:???Overall goal is???Modified independent???and Physical Therapy will evaluate and treat ambulation/wheelchair use and bed transfers  Activities of Daily Living (ADLs) Goals:???Overall goal is???Modified independent???and Occupational therapy will evaluate and treat basic Activities of Daily Living  Cognition / Communication Goals:???Overall goal is???Supervision???and Speech therapy will evaluate and treat cognition and communication deficits and assess for safe swallow            Barriers & Interventions:???  Caregiver Apprehension:???Arrange caregiver support and discuss barriers and patient progress with caregivers when appropriate.  High Burden of Care:???Initiate interdisciplinary rehabilitation to improve functional independence and reduce burden of care. Medication Education:??????Pharmacist and nursing staff to provide education to patient and family regarding medication side effects, special precautions, and safe administration.  Facilitators:???good home setup, good family / social support, patient motivation, improving strength / endurance and improving medical condition  ???  Discharge Planning  Expected Length of Stay???14???day(s)  Expected Discharge Disposition???Home  Expected Discharge Needs:???Patient would benefit from ongoing???physical, occupational and speech therapy???at the outpatient???level of care. Patient already owned a stair glide, grab bars in shower, shower chair and a walker???prior to hospitalization; however physical and occupational therapy will determine most appropriate equipment needs???upon discharge to home to improve upon safety and decrease risk of falls.          Romie Minus, BSN, RN

## 2019-02-23 NOTE — Progress Notes
Neurology Progress Note      Mitchell Lucero  Admission Date: 02/21/2019  LOS: 0 days     Assessment/Plan:  Principal Problem:    Dysarthria  Active Problems:    T2DM (type 2 diabetes mellitus) (HCC)      Sandip Gottschall Volkman is a 68 y.o. 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 CTP showed mismatch localizing to R superior cerebellar artery. MRI with acute to subacute R superior cerebellar infarct.     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  -L pontine/cerebellar peduncle infarct in 12/2018  -CTA 02/01/19 -> diffuse atherosclerosis with high grade stenosis at P2 and b/l distal vertebral arteries, SCA, and L M1  -MRI with L pons and L cerebellar peduncle infarct  -Echo 02/01/19 -> LVEF 55-60%, grade I diastolic dysfunction,  -DCd to IPR  -Stroke activated on 8/10 -> acute onset dysarthria and generalized weakness  -Dysarthria, R ataxia on exam  -CT H -> unremarkable  -CTA H/N -> stable diffuse athero, high grade stenosis at P2 and b/l distal verts, SCAs, and L M1  -CTP -> small mismatch at R superior cerebellum  -MRI 8/10 -> small acute to subacute infracts at R superior cerebellum, similar appearance of L pontine/cerebellar peduncle chronic infarct which is not typical of evolving infarct  -MRI W contrast 8/11 -> minimal thin enhancement at L pons hyperintensity consistent with evolving subacute to chronic infarct -Routine EEG -> consistent with mild to mod bi-hemispheric dysfunction of unspecific etiology  -LDL -> 85  -A1C -> 6.8  PLAN  >Cont ASA/plavix  >Cont atorvastatin 80mg  qday  >PT/OT/Rehab    Hepatocellular carcinoma  Decompensated liver cirrhosis  Hyperammonemia  Hyperbilirubinemia  -Dx in 01/2019  -CT Abd -> hepatic dome 7/8 lesion compatible with HCC  -Follows with hepatology and oncology  PLAN  >Cont pta lactulose  >Will need to reschedule previously scheduled hepatology appt, biopsy appt, and oncology appt from 02/22/19    DMII  -pta lantus 8u, LDCF  >Monitor BG    Hx of orthostatic hypotension  >Pta midodrine    Thrombocytopenia  -Chronic  -likely secondary to liver disease  >CTM    FEN: Diabetic diet, replace lytes prn, no IVF  DVT Ppx: lovenox  Code: Full  Dispo: DC back to IPR likely 8/12    Patient seen and discussed with Dr Farrell Ours, MD  Neurology, PGY-3  ________________________________________________________________________    Subjective:    Ellin Mayhew Leopard is a 68 y.o. male. No issues reported overnight. Patient reports that he continues to feel well. Improvement in coordination on the R.    Objective:                    Vital Signs:  Last Filed                   Vital Signs: 24 Hour Range   BP: 134/78 (08/12 1013)  Temp: 36.8 ???C (98.3 ???F) (08/12 1013)  Pulse: 85 (08/12 1013)  Respirations: 18 PER MINUTE (08/12 1013)  SpO2:  96 % (08/12 1013)  BP: (117-143)/(66-79)   Temp:  [36.6 ???C (97.9 ???F)-36.9 ???C (98.4 ???F)]   Pulse:  [82-91]   Respirations:  [16 PER MINUTE-20 PER MINUTE]   SpO2:  [95 %-97 %]     Intensity Pain Scale (Self Report): (not recorded)    Intake/Output Summary: (Last 24 hours)    Intake/Output Summary (Last 24 hours) at 02/23/2019 1224  Last data filed at 02/23/2019 0217  Gross per 24 hour   Intake 420 ml   Output 50 ml   Net 370 ml      Stool Occurrence: 0    Medications:  Scheduled Meds:aspirin EC tablet 81 mg, 81 mg, Oral, QAM8 atorvastatin (LIPITOR) tablet 80 mg, 80 mg, Oral, QHS  citalopram (CeleXA) tablet 20 mg, 20 mg, Oral, QAM8  clopiDOGrel (PLAVIX) tablet 75 mg, 75 mg, Oral, QDAY  docusate (COLACE) capsule 100 mg, 100 mg, Oral, BID  enoxaparin (LOVENOX) syringe 40 mg, 40 mg, Subcutaneous, QDAY  lactulose oral solution 20 g, 20 g, Oral, TID  latanoprost (XALATAN) 0.005 % ophthalmic solution 1 drop, 1 drop, Both Eyes, QHS  melatonin tablet 3 mg, 3 mg, Oral, QHS  midodrine (PROAMATINE) tablet 5 mg, 5 mg, Oral, TID  pantoprazole DR (PROTONIX) tablet 20 mg, 20 mg, Oral, QDAY    Continuous Infusions:  PRN and Respiratory Meds:acetaminophen Q4H PRN       General physical exam:    HEENT: normocephalic, eyes closed, nares patent, oropharynx is clear with no lesions, palate intact  CV: regular rate  Chest: normal configuration, equal chest rise bilaterally  Skin: no rashes or lesions  ???  Extended Neuro Exam:    Mental status: alert and interactive  Speech:   ??? Normal Abnormal   Fluency x ???   Comprehension x ???   Articulation ???x    Repetition  ???   Naming  ???   ???    Cranial Nerves:   ??? Normal Abnormal   II x ???   III, IV, VI x ???   V x ???   VII                        x                             VIII x ???   IX, X x ???   XI x ???   XII x ???   ???    Muscle/motor:   Tone: nml  Bulk: nml  Fasciculations: none  ???  ??? NF NE SA EF EE WE WF FF FE FA TA HF HA HE KF KE DF PF   R ??? ??? 5 5 5  ??? ??? 5 ??? ??? ??? 4+ ??? ??? 4 4 ???5 ???5   L ??? ??? 5 5 5  ??? ??? 5 ??? ??? ??? 4+ ??? ??? 4 4 ???5 ???5   ???    Sensation:   ??? Normal RUE LUE RLE LLE   Light Touch x ??? ??? ??? ???   Pin Prick ??? ??? ??? ??? ???   Temperature ??? ??? ??? ??? ???   Vibration ??? ??? ??? ??? ???   Proprioception ??? ??? ??? ??? ???   ???    Coordination:   ??? Normal Abnormal Right Abnormal Left   Finger to Nose ??? x ???   Rapid alternating  ??? ??? ???   Heel  to [facility]  ???x ???   Finger tap ??? ??? ???   Foot tap ??? ??? ???     Gait and Sation:  Regular gait: not assessed    Laboratory Review:     No results found for: PHART, PCO2A, PO2ART, HCO3A, BASEEXA, BASEDEFA, O2SATACAL  Lab Results Component Value Date    HGB 11.6 (L) 02/23/2019    HCT 33.8 (L) 02/23/2019    PLTCT 125 (L) 02/23/2019    WBC 6.2 02/23/2019    NEUT 67 02/22/2019    ANC 4.61 02/22/2019     Lab Results   Component Value Date    NA 139 02/23/2019    K 3.7 02/23/2019    CL 106 02/23/2019    CO2 26 02/23/2019    GAP 7 02/23/2019    BUN 15 02/23/2019    CR 0.72 02/23/2019    GLU 113 (H) 02/23/2019    CA 8.6 02/23/2019    TOTPROT 5.5 (L) 02/22/2019    AST 39 02/22/2019    ALT 31 02/22/2019    ALKPHOS 227 (H) 02/22/2019    AMMONIA 101 (H) 02/22/2019     No results found for: TNI, CKMB, MYOGLB  Lab Results   Component Value Date    INR 1.0 02/21/2019    INR 1.2 02/04/2019     No results found for: TSH, FREET4R, CORTRAN  Lab Results   Component Value Date    CHOL 154 02/22/2019    TRIG 57 02/22/2019    HDL 58 02/22/2019    LDL 85 02/22/2019    VLDL 11 02/22/2019     No results found for: VANRAN, VANPK, VANTR  No results found for: CYCLOSPOR, TACROLIMUS, FREEPHENY     Point of Care Testing:  (Last 24 hours):  Glucose: (!) 113  POC Glucose (Download): (!) 209    Radiology and Other Diagnostics Reviewed    Orson Eva. Celene Kras, MD   Neurology Resident

## 2019-02-23 NOTE — Case Management (ED)
Case Management Progress Note    NAME:Mitchell Lucero                          MRN: [id number]              DOB:1951-07-13          AGE: 68 y.o.  ADMISSION DATE: 02/21/2019             DAYS ADMITTED: LOS: 0 days      Todays Date: 02/23/2019    Plan  Discharge to [facility] at 1400.     Interventions  ? Support      ? Info or Referral      ? Discharge Planning    Attended huddle and reviewed EMR. Pt stable for d/c today.   SW coordinated with [facility] admissions. They have obtained insurance auth and can accept pt at 1400.   SW updated primary team and nursing.   SW updated pt's wife.   ? Medication Needs                                                 ? Financial      ? Legal      ? Other        Disposition  ? Expected Discharge Date    Expected Discharge Date: 02/23/19  Expected Discharge Time: 1400  ? Transportation   Does the patient need discharge transport arranged?: No  Transportation Name, Phone and Availability #1: pt's spouse [name]   Does the patient use Medicaid Transportation?: No  ? Next Level of Care (Acute Psych discharges only)      ? Discharge Disposition                                          Durable Medical Equipment      No service has been selected for the patient.      [place] [facility]      No service has been selected for the patient.      [facility]      No service has been selected for the patient.      [facility]      No service has been selected for the patient.        Janean Sark, [place]

## 2019-02-23 NOTE — Med Student Progress Note
Medical Student Progress Note -  Inpatient    NAME: Mitchell Lucero                                     MRN: [id number]                 DOB: [birth year]          AGE: 68 y.o.  ADMISSION DATE: 02/21/2019             DAYS ADMITTED: LOS: 2 days  Assessment/Plan:  Principal Problem:    Dysarthria  ???  ???  Ellin Mayhew Troyer???is a 68 y.o.???male???with a PMH of prior L lateral pons/cerebellar peduncle stroke, HTN, DMII, and recent diagnosis of hepatocellular carcinoma with hyperammoniemia due to decompensated cirrhosis who is admitted with acute R superior cerebellar infarct. 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 CTP showed mismatch localizing to R superior cerebellar artery. MRI with acute to subacute R superior cerebellar infarct.    Pt has been having episodes of R sided hyperactivity lasting 15 minutes and rapidly returning to baseline with no post-ictal confusion or amnesia prior to infarct patient is currently admitted for. Pt likely has been having TIAs due to a combination of hepatic encephalopathy and cerebral hypoperfusion associated with orthostatic hypotension.   ???  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  -L pontine/cerebellar peduncle infarct in 12/2018  -CTA 02/01/19 -> diffuse atherosclerosis with high grade stenosis at P2 and b/l distal vertebral arteries, SCA, and L M1  -MRI with L pons and L cerebellar peduncle infarct  -Echo 02/01/19 -> LVEF 55-60%, grade I diastolic dysfunction,  -DCd to [facility]  -Stroke activated on 8/10 -> acute onset dysarthria and generalized weakness  -Dysarthria, R ataxia on exam  -CT H -> unremarkable  -CTA H/N -> stable diffuse athero, high grade stenosis at P2 and b/l distal verts, SCAs, and L M1 -CTP -> small mismatch at R superior cerebellum  -MRI 8/10 -> small acute to subacute infracts at R superior cerebellum, similar appearance of L pontine/cerebellar peduncle chronic infarct which is not typical of evolving infarct  -LDL -> 85  -A1C -> 6.8  PLAN  >MRI with contrast to further evaluate L pontine/cerebellar peduncle FLAIR hyperintensity given history of active hepatocellular carcinoma --> showed L sided slowly evolving subacute to chronic infarct consistent with previous imaging. Additionally showed R sided cerebellar infarction.  >Cont ASA/plavix  >Cont atorvastatin 80mg  qday  >Routine EEG given lethargy on admission  >PT/OT/Rehab  >Cont Minodrine and BP monitoring to avoid hypotension and prevent future TIAs  ???  Hepatocellular carcinoma  Decompensated liver cirrhosis  Hyperammonemia  Hyperbilirubinemia  -Dx in 01/2019  -CT Abd -> hepatic dome 7/8 lesion compatible with [facility]  -Follows with hepatology and oncology  PLAN  >Cont pta lactulose  >Will need to reschedule previously scheduled hepatology appt, biopsy appt, and oncology appt from 02/22/19  ???  DMII  -pta lantus 8u, LDCF  >Monitor BG  ???  Hx of orthostatic hypotension  >Pta midodrine increased from 2.5 to 5 mg TID  ???  Thrombocytopenia  -Chronic  -likely secondary to liver disease  >CTM  ???  FEN: Diabetic diet, replace lytes  prn, no IVF  DVT Ppx: lovenox  Code: Full  Dispo: DC back to [facility] pending insurance approval  ???  Patient seen and discussed with Dr. Aurther Loft.  ________________________________________________________________________  ???  Subjective:    Mitchell Lucero is a 68 y.o. male. No issues reported overnight. Patient states he is feeling much better this morning. He has no complaints.    Review of Systems   Constitutional: Negative for chills and fever.   HENT: Negative for congestion and sore throat.    Eyes: Negative for blurred vision, double vision and discharge.   Respiratory: Negative for cough and shortness of breath. Cardiovascular: Negative for chest pain and palpitations.   Gastrointestinal: Negative for abdominal pain, constipation, diarrhea, heartburn, nausea and vomiting.   Genitourinary: Negative for dysuria, frequency and urgency.   Skin: Negative for rash.   Neurological: Negative for dizziness, tingling, seizures and headaches.     ???  Objective:  Vitals:    02/22/19 1722 02/22/19 2050 02/23/19 0532 02/23/19 1013   BP: 136/74 (!) 143/79 117/66 134/78   BP Source: Arm, Right Upper Arm, Right Upper Arm, Right Upper Arm, Right Upper   Pulse: 88 85 91 85   Temp: 36.8 ???C (98.2 ???F) 36.8 ???C (98.3 ???F) 36.6 ???C (97.9 ???F) 36.8 ???C (98.3 ???F)   SpO2: 95% 97% 95% 96%   Weight:       Height:         Physical Exam   Neurological Exam:  Mental Status: Alert and interactive. Oriented to person, place, and time.  Speech and Language: Pt speech is slightly clearer than yesterday and does not appear to have word finding difficulty. Pt understands and follows commands.  Cranial Nerves: CN II-XII intact except for R side quieter than left to finger rub next to the ear.  Strength: 5/5 throughout except for 4+ on R knee flexion and extension.  Sensory: Sensation to light touch at distal upper and lower extremities intact.  Reflexes: 2+ b/l at biceps, brachioradialis, patella, and achilles.  Coordination: FTN and HTS testing abnormal on the R. Normal on the L. Improved control when nearing target compared to yesterday.  Gait: Deferred.    Constitutional: He is well-developed, well-nourished, and in no distress.   HENT:   Head: Normocephalic and atraumatic.   Right Ear: External ear normal.   Left Ear: External ear normal.   Cardiovascular: Normal rate, regular rhythm and intact distal pulses.   Systolic murmur heard on exam, consistent with aortic stenosis.  Pulmonary/Chest: Effort normal and breath sounds normal. He has no wheezes. He has no rales.   Skin: Skin is warm and dry. No rash noted. No erythema.   ???  Medications: Scheduled Meds:aspirin EC tablet 81 mg, 81 mg, Oral, QAM8  atorvastatin (LIPITOR) tablet 80 mg, 80 mg, Oral, QHS  citalopram (CeleXA) tablet 20 mg, 20 mg, Oral, QAM8  clopiDOGrel (PLAVIX) tablet 75 mg, 75 mg, Oral, QDAY  docusate (COLACE) capsule 100 mg, 100 mg, Oral, BID  enoxaparin (LOVENOX) syringe 40 mg, 40 mg, Subcutaneous, QDAY  lactulose oral solution 20 g, 20 g, Oral, TID  latanoprost (XALATAN) 0.005 % ophthalmic solution 1 drop, 1 drop, Both Eyes, QHS  melatonin tablet 3 mg, 3 mg, Oral, QHS  midodrine (PROAMATINE) tablet 5 mg, 5 mg, Oral, TID  pantoprazole DR (PROTONIX) tablet 20 mg, 20 mg, Oral, QDAY  ???  Continuous Infusions:  PRN and Respiratory Meds:acetaminophen Q4H PRN     ???  Laboratory Review:   24-hour  labs:    Results for orders placed or performed during the hospital encounter of 02/21/19 (from the past 24 hour(s))   CBC    Collection Time: 02/23/19  6:11 AM   Result Value Ref Range    White Blood Cells 6.2 4.5 - 11.0 K/UL    RBC 3.80 (L) 4.4 - 5.5 M/UL    Hemoglobin 11.6 (L) 13.5 - 16.5 GM/DL    Hematocrit 16.1 (L) 40 - 50 %    MCV 88.9 80 - 100 FL    MCH 30.6 26 - 34 PG    MCHC 34.4 32.0 - 36.0 G/DL    RDW 09.6 (H) 11 - 15 %    Platelet Count 125 (L) 150 - 400 K/UL    MPV 7.5 7 - 11 FL   BASIC METABOLIC PANEL    Collection Time: 02/23/19  6:11 AM   Result Value Ref Range    Sodium 139 137 - 147 MMOL/L    Potassium 3.7 3.5 - 5.1 MMOL/L    Chloride 106 98 - 110 MMOL/L    CO2 26 21 - 30 MMOL/L    Anion Gap 7 3 - 12    Glucose 113 (H) 70 - 100 MG/DL    Blood Urea Nitrogen 15 7 - 25 MG/DL    Creatinine 0.45 0.4 - 1.24 MG/DL    Calcium 8.6 8.5 - 40.9 MG/DL    eGFR Non African American >60 >60 mL/min    eGFR African American >60 >60 mL/min   HEMOGLOBIN A1C    Collection Time: 02/23/19  6:11 AM   Result Value Ref Range    Hemoglobin A1C 6.3 (H) 4.0 - 6.0 %     Radiology and Other Diagnostics Reviewed    Lissa Hoard, MS3

## 2019-02-23 NOTE — Progress Notes
OCCUPATIONAL THERAPY  PROGRESS NOTE      Name: Mitchell Lucero        MRN: [id number]          DOB: [birth year]          Age: 68 y.o.  Admission Date: 02/21/2019             LOS: 0 days      Mobility  Patient Turn/Position: Chair  Progressive Mobility Level: Walk in room  Distance Walked (feet): 20 ft  Level of Assistance: Assist X1  Assistive Device: Walker  Time Tolerated: 11-30 minutes  Activity Limited By: Weakness    Subjective  Pertinent Dx per Physician: Pt with hx of recent Left pontine stroke admitted from [facility] with acute R superior cerebellar infarct  Precautions: Falls;Seizure Precautions(abdominal binder, compression stockings)  Pain / Complaints: Patient agrees to participate in therapy  Comments: Patient in bed upon therapist entry, reports need to use the bathroom.  Patient in chair with LEs reclined, needs in reach, precautions in place and providers at bedside at end of visit.     Objective  Psychosocial Status: Willing and Cooperative to Participate  Persons Present: Spouse    ADL's  Where Assessed: In Bathroom  LE Dressing Assist: Maximum Assist  LE Dressing Deficits: Thread RLE Into Underwear;Thread LLE Into Underwear;Pull Up Over Hips(Setup assist for BLE TED hose)  Toileting Assist: Moderate Assist  Toileting Deficits: Clothing Management Up;Clothing Management Down;Steadying    ADL Mobility  Bed Mobility: Supine to Sit: Minimal assist  Bed Mobility Comments: HOB elevated;bed rail;hand hold assist  Transfer Type: Sit to/from stand  Transfer: Assistance Level: Minimal assist;From;Bed  Transfer: Assistive Device: Agricultural consultant: Type of Assistance: For safety considerations;For balance;For strength deficit  Other Transfer Type: Sit to/from stand  Other Transfer: Assistance Level: To/from;Toilet;Minimal assist  Other Transfer: Assistive Device: Roller walker  Other Transfer: Type of Assistance: For safety considerations;For strength deficit;For balance  Gait Distance: 20 feet Gait: Assistance Level: Minimal assist;Moderate assist  Gait: Assistive Device: Roller walker  Gait Comments: Truncal and UE ataxia/incoordination; increased Right foot clearance inititally during ambulation but decreased with fatigue.    Activity Tolerance  Endurance: 3/5 Tolerates 25-30 Minutes Exercise w/Multiple Rests    Education  Persons Educated: Patient/Family  Teaching Methods: Verbal Instruction;Demonstration  Patient Response: Return Demonstration;More Instruction Required  Topics: Role of OT, Goals for Therapy;ADL Compensatory Techniques;Home safety;DME for Home Discharge  Goal Formulation: With Patient    Assessment  Assessment: Decreased ADL Status;Decreased Safe/Judg during ADL;Decreased Cognition;Decreased Endurance;Decreased Self-Care Trans;Decreased High-Level ADLs  Prognosis: Good;w/Cont OT s/p Acute Discharge  Goal Formulation: Patient     Plan  OT Frequency: 5x/week  OT Plan for Next Visit: Standing at sink for grooming; LE dressing at EOB    ADL Goals  Patient Will Perform All ADL's: w/ Stand By Assist  Patient Will Perform Grooming: w/ Stand By Assist  Patient Will Perform LE Dressing: w/ Stand By Assist  Patient Will Perform Toileting: w/ Stand By Assist    Functional Transfer Goals  Pt Will Perform All Functional Transfers: w/ Stand By Assist    OT Discharge Recommendations  Recommendation: Inpatient setting;Recommend rehab medicine consult  Patient Currently Requires Physical Assist With: All mobility;All personal care ADLs;All home functioning ADLs    Therapist: Lilian Coma, OTR/L [id number]  Date: 02/23/2019

## 2019-02-23 NOTE — Discharge Instructions - Pharmacy
Physician Discharge Summary      Name: Mitchell Lucero  Medical Record Number: [id number]        Account Number:  [id number]  Date Of Birth:  12-26-1950                         Age:  68 years   Admit date:  02/21/2019                     Discharge date:  02/23/2019    Attending Physician:  Dr Aurther Loft               Service: Neurology Stroke    Physician Summary completed by: Orson Eva. Celene Kras, MD     Reason for hospitalization: R superior cerebellar infarct    Significant PMH:   Medical History:   Diagnosis Date   ??? DM (diabetes mellitus) (HCC)    ??? Hypertension    ??? TIA (transient ischemic attack)    ??? Tremors of nervous system    ??? Weight loss          Allergies: Patient has no known allergies.    Admission Physical Exam notable for:     HEENT: normocephalic, eyes closed, nares patent, oropharynx is clear with no lesions, palate intact  CV: regular rate  Chest: normal configuration, equal chest rise bilaterally  Ab: soft, non-tender, no masses, no organomegaly  Skin: no rashes or lesions  ???  Extended Neuro Exam:    Mental status: alert and interactive  Speech:   ??? Normal Abnormal   Fluency x ???   Comprehension x ???   Articulation ??? Mild dysarthria   Repetition x ???   Naming x ???   ???    Cranial Nerves:   ??? Normal Abnormal   II x ???   III, IV, VI x ???   V x ???   VII                         x                             VIII x ???   IX, X x ???   XI x ???   XII x ???   ???    Muscle/motor:   Tone: nml  Bulk: nml  Fasciculations: none  ???  ??? NF NE SA EF EE WE WF FF FE FA TA HF HA HE KF KE DF PF   R ??? ??? 5 5 5  ??? ??? 5 ??? ??? ??? 4 ??? ??? 4 4 ??? ???   L ??? ??? 5 5 5  ??? ??? 5 ??? ??? ??? 4 ??? ??? 4 4 ??? ???   ???    Sensation:   ??? Normal RUE LUE RLE LLE   Light Touch x ??? ??? ??? ???   Pin Prick ??? ??? ??? ??? ???   Temperature ??? ??? ??? ??? ???   Vibration ??? ??? ??? ??? ???   Proprioception ??? ??? ??? ??? ???   ???    Coordination:   ??? Normal Abnormal Right Abnormal Left   Finger to Nose ??? x ???   Rapid alternating  ??? ??? ???   Heel to Shin x ??? ???   Finger tap ??? ??? ???   Foot tap ??? ??? ???  Gait and Sation: Regular gait: not assessed  ???    Admission Lab/Radiology studies notable for:     Lab/Radiology/Other Diagnostic Tests:  24-hour labs:          Results for orders placed or performed during the hospital encounter of 02/21/19 (from the past 24 hour(s))   POC GLUCOSE   ??? Collection Time: 02/21/19  3:07 PM   Result Value Ref Range   ??? Glucose, POC 209 (H) 70 - 100 MG/DL   POC PT/INR   ??? Collection Time: 02/21/19  3:07 PM   Result Value Ref Range   ??? INR POC 1.0 0.8 - 1.2   POC TROPONIN   ??? Collection Time: 02/21/19  3:08 PM   Result Value Ref Range   ??? Troponin-I-POC 0.00 0.00 - 0.05 NG/ML   POC CREATININE, RAD   ??? Collection Time: 02/21/19  3:10 PM   Result Value Ref Range   ??? Creatinine, POC 0.7 0.4 - 1.24 MG/DL   CBC AND DIFF   ??? Collection Time: 02/21/19  3:32 PM   Result Value Ref Range   ??? White Blood Cells 6.6 4.5 - 11.0 K/UL   ??? RBC 3.77 (L) 4.4 - 5.5 M/UL   ??? Hemoglobin 11.6 (L) 13.5 - 16.5 GM/DL   ??? Hematocrit 34.1 (L) 40 - 50 %   ??? MCV 90.4 80 - 100 FL   ??? MCH 30.7 26 - 34 PG   ??? MCHC 34.0 32.0 - 36.0 G/DL   ??? RDW 15.8 (H) 11 - 15 %   ??? Platelet Count 144 (L) 150 - 400 K/UL   ??? MPV 7.6 7 - 11 FL   ??? Neutrophils 55 41 - 77 %   ??? Lymphocytes 30 24 - 44 %   ??? Monocytes 10 4 - 12 %   ??? Eosinophils 4 0 - 5 %   ??? Basophils 1 0 - 2 %   ??? Absolute Neutrophil Count 3.66 1.8 - 7.0 K/UL   ??? Absolute Lymph Count 2.00 1.0 - 4.8 K/UL   ??? Absolute Monocyte Count 0.63 0 - 0.80 K/UL   ??? Absolute Eosinophil Count 0.29 0 - 0.45 K/UL   ??? Absolute Basophil Count 0.05 0 - 0.20 K/UL   BASIC METABOLIC PANEL   ??? Collection Time: 02/21/19  3:32 PM   Result Value Ref Range   ??? Sodium 136 (L) 137 - 147 MMOL/L   ??? Potassium 3.8 3.5 - 5.1 MMOL/L   ??? Chloride 102 98 - 110 MMOL/L   ??? CO2 28 21 - 30 MMOL/L   ??? Anion Gap 6 3 - 12   ??? Glucose 220 (H) 70 - 100 MG/DL   ??? Blood Urea Nitrogen 16 7 - 25 MG/DL   ??? Creatinine 0.80 0.4 - 1.24 MG/DL   ??? Calcium 8.6 8.5 - 10.6 MG/DL   ??? eGFR Non African American >60 >60 mL/min ??? eGFR African American >60 >60 mL/min   ???  POC Glucose (Download): (!) 209 (02/21/19 1507)        Brief Hospital Course:  The patient was admitted and the following issues were addressed during this hospitalization: (with pertinent details).      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 CTA H/N showed no evidence of LVO, but chronic areas  of focal occlusions/severe stenoses at bilateral superior cerebellar arteries and R P2, mild to mod athero at verts, unchanged bilateral ICA atherosclerotic disease. CTP showed mismatch localizing to R superior cerebellar artery. MRI with acute to subacute R superior cerebellar infarct. Echo from 02/01/19 showed LVEF 55-60%, grade I diastolic dysfunction. LDL on admission was 85 and A1C was 6.8. 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 Patient was continued on DAPT and atorvastatin 80mg  qday at discharge. Patient reported ongoing episodes of limb shaking with standing consistent with limb shaking TIAs. He had his pta midodrine increased to 5mg  TID to increase blood pressure and maintain perfusion with standing. He missed appointments with hepatology and oncology regarding [facility] and these have been rescheduled. Patient discharged back to [facility].    Etiology - Large vessel occlusion and atherosclerosis  NIH on DC - 3    Condition at Discharge: Stable    Discharge Diagnoses:      Hospital Problems        Active Problems    * (Principal) Dysarthria    T2DM (type 2 diabetes mellitus) (HCC)          Surgical Procedures: None    Significant Diagnostic Studies and Procedures: noted in brief hospital course    Consults:  Rehabilitative Medicine Patient Disposition: [facility]       Patient instructions/medications:       AMB REFERRAL TO NEUROLOGY   Referral Type: Consult, Test & Treat   Referral Reason: Specialty Services Required   Requested Specialty: Neurology   Number of Visits Requested: 1 Expiration Date: 02/23/20     Diabetic Diet    You should eat between 1600 and 2000 calories per day.  This is equal to 60g (grams) of carbohydrates per meal, and 30g of carbohydrates for a bedtime snack.    If you have questions about your diet after you go home, you can call a dietitian at [phone].     Report These Signs and Symptoms    STROKE Call 911 for the following symptoms:    *Sudden numbness or weakness of the face, arm or leg, especially on one side of the body.  *Sudden confusion, trouble speaking or understanding.  *Sudden trouble seeing in one or both eyes.  *Sudden trouble walking, dizziness, loss of balance or coordination.  *Sudden, severe headache with no known cause.     Risk Reduction Plan    Stroke Personal Risk Factor Reduction Plan  **Take this sheet to your physician to show treatment recommendations**    Depression Risk  Goal:    It is common to experience depression after a stroke.                  Plan:    If you experience symptoms of depression, please follow-up with your primary care physician.     High Blood Pressure Risk   Goal:    Keep blood pressure below 130/80  Your Numbers:     BP Readings from Last 1 Encounters:  02/23/19 : 134/78        Plan:    High blood pressure is the single most important risk factor for stroke because it's the #1 cause of stroke.  Take medication as prescribed and monitor your blood pressure.    Atrial Fibrillation Risk   Goal:     This heart rhythm raises the risk of stroke. Anticoagulants may be prescribed.  Your Numbers: INR       Date                     Value               Ref Range           Status 02/04/2019               1.2                 0.8 - 1.2           Final            ----------  INR POC       Date                     Value               Ref Range           Status                02/21/2019               1.0                 0.8 - 1.2           Final            ----------  Plan: Follow up with your Primary Care Physician to manage your anticoagulation therapy, if you have been diagnosed with atrial fibrillation.    Abnormal lipids (fats in blood) Risk  Goal: Total Cholesterol: <200  LDL (bad cholesterol): <100  HDL (good cholesterol): >40 for men, >50 for women  Triglycerides: <150  Your Numbers: Cholesterol       Date                     Value               Ref Range           Status                02/22/2019               154                 <200 MG/DL          Final            ----------  LDL       Date                     Value               Ref Range           Status                02/22/2019               85                  <100 mg/dL          Final            ----------  HDL       Date                     Value  Ref Range           Status                02/22/2019               58                  >40 MG/DL           Final            ----------  Triglycerides       Date                     Value               Ref Range           Status                02/22/2019               57                  <150 MG/DL          Final            ----------  Plan: Diets high in saturated fat, trans fat and cholesterol can raise blood cholesterol levels increasing your risk of stroke.  Take medication as prescribed and eat a heart healthy, low sodium diet.    Smoking Risk   Goals: If you smoke, STOP!      Plan: Smoking DOUBLES your risk of stroke. Quitting can greatly reduce your risk. To register for smoking cessation program call [phone] or visit www.smokefree.gov    Diabetes Risk   Goal: for Non-diabetic: Below 5.7%  Goal for diabetic: Less than 7%  Your Numbers: Hemoglobin A1C Date                     Value               Ref Range           Status                02/23/2019               6.3 (H)             4.0 - 6.0 %         Final              Comment:    The ADA recommends that most patients with type 1 and type 2 diabetes maintain     an A1c level <7%.      ----------  Plan: If you have diabetes, even if treated, you are at an increased risk of stroke. To minimize your risk, make sure to follow-up with a primary care provider or endocrinologist and check your blood sugars frequently.    Alcohol use Risk   Goal: Alcohol abuse can lead to stroke.  Plan: For men, limit intake to no more than 2 drinks per day.  For women, limit intake to no more than one drink per day.    Weight Management Risk  Goal: Healthy: BMI is 18.5 to 24.9  Overweight: BMI is 25 to 29.9  Obese: BMI is 30 or higher  Your Numbers: Your BMI (Calculated): 26.45  Plan: If you have questions about your diet after you go home, you can call a dietitian at [phone]  Physical Activity Risk  Goal: Stroke patients should have approval by a physician prior to beginning an exercise program.  Plan: Try to get at least 30 minutes of moderate physical activity five days a week or 20 minutes of vigorous physical activity three days a week with your doctor's approval.    ACT F.A.S.T - Call 911 if Stroke Warning Signs Occur    Face-         Ask the person to smile.   Does one side of the face droop?    Arms-         Ask the person to raise both arms.   Does one arm drift downward?    Speech-    Ask the person to say a simple sentence.    Time-        If the person shows any of these symptoms, time is important. Call 911.     Questions About Your Stay    STANDARD For questions or concerns regarding your hospital stay:    -DURING BUSINESS HOURS (8:00 AM - 4:30 PM):  Call [phone] and ask to be transferred to your discharge attending physician.    -AFTER BUSINESS HOURS (4:30 PM - 8:00 AM, on weekends, or holidays): Call [phone] and ask the operator to page the on-call doctor for the discharge attending physician.     Discharging attending physician: Deveron Furlong [[id number]]      Activity as Tolerated    It is important to keep increasing your activity level after you leave the hospital.  Moving around can help prevent blood clots, lung infection (pneumonia) and other problems.  Gradually increasing the number of times you are up moving around will help you return to your normal activity level more quickly.  Continue to increase the number of times you are up to the chair and walking daily to return to your normal activity level. Begin to work towards your normal activity level at discharge.      Current Discharge Medication List       CONTINUE these medications which have been CHANGED or REFILLED    Details   midodrine (PROAMATINE) 2.5 mg tablet Take two tablets by mouth three times daily. 0800, 1200, 1700  Qty: 90 tablet, Refills: 1    PRESCRIPTION TYPE:  Normal          CONTINUE these medications which have NOT CHANGED    Details   acetaminophen (TYLENOL) 325 mg tablet Take two tablets by mouth every 4 hours as needed.    PRESCRIPTION TYPE:  OTC      aspirin EC 81 mg tablet Take 81 mg by mouth every morning. Take with food.    PRESCRIPTION TYPE:  Historical Med      atorvastatin (LIPITOR) 80 mg tablet Take 80 mg by mouth at bedtime daily.    PRESCRIPTION TYPE:  Historical Med      Bimatoprost (LUMIGAN) 0.01 % drop Apply 1 drop to both eyes at bedtime daily.    PRESCRIPTION TYPE:  Historical Med      citalopram (CELEXA) 40 mg tablet Take 20 mg by mouth every morning.    PRESCRIPTION TYPE:  Historical Med      clopiDOGrel (PLAVIX) 75 mg tablet Take one tablet by mouth daily.  Qty: 60 tablet, Refills: 0    PRESCRIPTION TYPE:  Print  Comments: For stroke. Can stop after runs out to complete 3 months.      docusate (COLACE) 100 mg capsule Take one  capsule by mouth twice daily.  Qty: 180 capsule PRESCRIPTION TYPE:  OTC      insulin detemir U-100(+) (LEVEMIR) 100 unit/mL vial Inject eight Units under the skin at bedtime daily.  Qty: 10 mL, Refills: 0    PRESCRIPTION TYPE:  Normal      insulin regular (NOVOLIN R) 100 unit/mL injection Inject  under the skin three times daily as needed. Sliding scale    PRESCRIPTION TYPE:  Historical Med      lactulose 10 gram/15 mL oral solution Take 30 mL by mouth three times daily.  Qty: 946 mL, Refills: 1    PRESCRIPTION TYPE:  Normal      loperamide (IMODIUM A-D) 2 mg capsule Take 2 mg by mouth as Needed for Diarrhea. Take 2 capsules by mouth initially, followed by 1 capsule by mouth after each loose stool up to a maximum of 8 tablets in 24 hours.    PRESCRIPTION TYPE:  Historical Med      melatonin 3 mg tab Take one tablet by mouth at bedtime daily.    PRESCRIPTION TYPE:  OTC      pantoprazole DR (PROTONIX) 20 mg tablet Take one tablet by mouth daily.  Qty: 30 tablet, Refills: 1    PRESCRIPTION TYPE:  Normal      polyethylene glycol 3350 (MIRALAX) 17 g packet Take one packet by mouth daily.  Qty: 12 each    PRESCRIPTION TYPE:  OTC      vitamin A & D oint Apply  topically to affected area twice daily.  Qty: 60 g    PRESCRIPTION TYPE:  OTC           Scheduled appointments:    Mar 07, 2019  8:00 AM CDT  (Arrive by 7:00 AM)  GENERAL IR LIVER BIOPSY with Robbie Lis, MD, IR ROOM 5  [facility] Radiology) [address]  [address]  [place] [address]  [phone]   Mar 07, 2019  9:00 AM CDT  (Arrive by 8:00 AM)  GENERAL IR BODY EMBOLIZATION with Robbie Lis, MD, IR ROOM 4  [facility]iology [facility] Radiology) [address]  [address]  [place] [address]  [phone]   Mar 07, 2019 11:00 AM CDT  (Arrive by 10:00 AM)  IR ABLATION with Robbie Lis, MD, IR ROOM 5  [facility] Radiology [facility] Radiology) [address]  [address] [place] [address]  [phone]   Mar 23, 2019  9:00 AM CDT  (Arrive by 8:45 AM)  New Patient with Harsh Delia Chimes, MD  [facility] (Neurology) [address]  [address]  [place] [address]  [phone]   Apr 20, 2019 11:30 AM CDT  (Arrive by 11:15 AM)  Return Patient with Su Monks, MD  [facility] Medicine) [address]  [place] [address]  [phone]   May 13, 2019  2:00 PM CDT  Return Patient with Jill Alexanders, MBChB  [facility] (Neurology) [address]  [place] [address]  [phone]          Pending items needing follow up: FU with hepatology, neurology    Signed:  Orson Eva. Celene Kras, MD  02/23/2019      cc:  Primary Care Physician:  Wilford Grist   Verified  Referring physicians:  No ref. provider found   Additional provider(s):

## 2019-02-24 ENCOUNTER — Encounter: Admit: 2019-02-24 | Discharge: 2019-02-24

## 2019-02-24 LAB — BASIC METABOLIC PANEL CELLULAR THERAPEUTICS: Lab: 139 MMOL/L — ABNORMAL LOW (ref >60)

## 2019-02-24 LAB — POC GLUCOSE
Lab: 209 mg/dL — ABNORMAL HIGH (ref 70–100)
Lab: 120 mg/dL — ABNORMAL HIGH (ref 70–100)
Lab: 221 mg/dL — ABNORMAL HIGH (ref 70–100)
Lab: 177 mg/dL — ABNORMAL HIGH (ref 70–100)

## 2019-02-24 LAB — CBC CELLULAR THERAPEUTICS: Lab: 5.7 10*3/uL — ABNORMAL HIGH (ref 4.5–11.0)

## 2019-02-24 MED ORDER — MAGNESIUM CITRATE PO SOLN
296 mL | Freq: Once | ORAL | 0 refills | Status: CP
Start: 2019-02-24 — End: ?
  Administered 2019-02-24: 23:00:00 296 mL via ORAL

## 2019-02-24 MED ORDER — BISACODYL 10 MG RE SUPP
10 mg | Freq: Once | RECTAL | 0 refills | Status: DC
Start: 2019-02-24 — End: 2019-02-24

## 2019-02-24 MED ORDER — MAGNESIUM CITRATE PO SOLN
296 mL | Freq: Once | ORAL | 0 refills | Status: DC | PRN
Start: 2019-02-24 — End: 2019-02-24

## 2019-02-24 MED ORDER — SODIUM CHLORIDE 0.9 % IJ SOLN
3 mL | INTRAVENOUS | 0 refills | Status: DC
Start: 2019-02-24 — End: 2019-02-25
  Administered 2019-02-24 – 2019-02-25 (×4): 3 mL via INTRAVENOUS

## 2019-02-24 MED ORDER — MAGNESIUM CITRATE PO SOLN
296 mL | Freq: Once | ORAL | 0 refills | Status: DC
Start: 2019-02-24 — End: 2019-02-24

## 2019-02-24 NOTE — Progress Notes
OCCUPATIONAL THERAPY     02/24/19 1000   History   Reason For Admission 68 y.o. 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 who is admitted with acute R superior cerebellar infarct   Home Environment   Home Situation Lives with Family  (spouse and mother)   Type of Home House  (split level)   Patient Special educational needs teacher, cane   Home Layout Bed/Bath Upstairs   Entry Stairs # 0   In Home Stairs # 13  (stair glider)   Financial risk analyst / Tub Designer, fashion/clothing in Air traffic controller;Shower Agricultural consultant Stand By Assist  (per clinical judgement)   Grooming Comments pt declines to perform, likely would require setup assist seated in chair   Bathing   Bath/Shower Soap and water shower/bath   Bathing - Chest Assist No   Bathing - Left Arm Assist No   Bathing - Right Arm Assist No   Bathing - Abdomen Assist No   Bathing - Perineal Area Assist No   Bathing - Buttocks Assist Yes   Bathing - Left Upper Leg Assist No   Bathing - Right Upper Leg Assist No   Bathing - Left Lower Leg Including Foot Assist Yes   Bathing - Right Lower Leg Including Foot Assist Yes   Bathing Assist Total Assist   Bathing Position Shower - sitting   Bathing Comments rated total assist for safety/cues. cues for sequencing, initiation, thoroughness   Upper Body Dressing   UE Dressing Assist Total Assist   Upper Dressing Position Sitting in chair   Upper Dressing Comments assist to orient shirts, thread UEs through shirt sleeves. pt able to get shirt partially over head, assist to fully complete rest of task. rated total assist for safety/cues   Lower Body Dressing   LE Dressing Assist Total Assist   Lower Dressing Position Sitting in chair   Lower Dressing Comments rated total assist for safety/cues as well as requiring second person assist to pull over hips. assist for all aspects of task   Toileting   Toileting Assist Total Assist  (per clinical judgement)   Toileting Comments anticipate would require total assist/second person   Nutritional therapist Assist Total assist   Toilet Transfer Comments no urgency, anticipate would require total assist for safety/cues   Tub/Shower Transfer   Tub/Shower Transfer Technique Stand pivot to left   Shower Transfer Assist Total assist   Tub/Shower Comments assist x2, rated total assist for safety/cues   9-Hole Peg Test    Male-Right 2:14   Male-Left 3:21.93   9-Hole Peg Test Interpretation per review of results from previous admission, significant decline in fine motor coordination   Dynamometer   Male-Right 26   Male-Left 30   Dynamometer Interpretation slight decline in grip strength compared to previous assessment   Assessment   Assessment pt presents with new weakness and decreased functional ability secondary to new stroke. anticipate will benefit from intensive therapy services to maximize function prior to return home   Plan   OT Plan Balance training;Cognitive training;Family / caregiver training;Fine motor coordination;Functional transfers training;Self-care retraining;Therapeutic exercises   Intensity 60   Plan Comments ARAT, BITS assessements, LE dressing, toileting, ADL/ functional mobility   Recommendations   OT Discharge Recommendations Home with Home Health;Vs;Home setting w/Outpatient Therapy   OT Equipment  Recommendations Patient owns necessary equipment   Expected Discharge Date 03/10/19   Weekly Goals   Patient Will Perform Bathing w/ Moderate Assist   Patient Will Perform UE Dressing w/ Moderate Assist   Patient Will Perform LE Dressing w/ Moderate Assist   Patient Will Perform Grooming w/ Stand By Assist;in Chair   Patient Will Perform Toileting w/ Moderate Assist   Pt Will Perform All Functional Transfers Moderate Assist   Goals for the stay Pt will perform basic care and transfer with Minimum assistance  (least restrictive device, assistance with IADLs )     Lorella Nimrod, OTR/L [phone]

## 2019-02-24 NOTE — Progress Notes
Rehabilitation Medicine Progress Note     Patient: Mitchell Lucero   [id number]   Date of Service: 02/24/2019  Date of Admission: 02/23/2019  Length of Stay: 1 days    Principal Problem:    Cerebellar stroke, acute (HCC)  Active Problems:    Orthostatic hypotension    HTN (hypertension)    Tremors of nervous system    T2DM (type 2 diabetes mellitus) (HCC)    History of neck surgery    Left pontine stroke (HCC)    TIA (transient ischemic attack)    Gait abnormality    Right hemiparesis (HCC)    Cirrhosis (HCC)    Hepatocellular carcinoma (HCC)    Dysarthria    Thrombocytopenia (HCC)    Occlusion of superior cerebellar artery    Impaired gait    Postural instability of trunk    Ataxia    Impaired mobility and ADLs    Acute ischemic stroke [facility])      ASSESSMENT & PLAN     Artavius Stearns Renne is a 68 y.o. male who is currently admitted to inpatient rehabilitation for Cerebellar stroke, acute (HCC).      Rehabilitation Plan  Rehabilitation: Patient will continue with comprehensive therapies including physical therapy, occupational therapy, speech & language pathology (as applicable), specialized rehab nursing, neuropsychology and physiatry oversight.    Goals: Minimum assistance, household mobility, With caregiver assistance  Recommended therapy after discharge: Home with Assistance, Home Health Setting  Recommended equipment: Too early to be determined  Tentative discharge date: 03/10/19    Daily Functional Update:  Transfers        Transfer: Assistive Device: Roller Walker (02/22/2019  2:00 PM)    Transfer: Sit to stand assist level: Minimum assistance;With two person (02/21/2019  2:00 PM)    Transfer: Stand pivot assist level: Maximum assistance;With two person (02/21/2019  2:00 PM)    No data recorded   Gait/ Mobility        Gait: Assistive Device: Nurse, adult;Wheelchair Follow (02/22/2019  2:00 PM)    GAIT: Assist Level: Contact guard assistance (plus spouse providing wheelchair follow for safety) (02/20/2019  9:45 AM) Gait Distance: 5 feet (02/22/2019  2:00 PM)    No data recorded  No data recorded   Toileting        Toileting Assist: Moderate Assist (02/23/2019  1:00 PM)    Toileting Equipment: Grab bar - left (02/05/2019  1:40 PM)    Toilet Transfer Assist: Contact guard assistance (02/17/2019  1:00 PM)     Dressing LE Dressing Assist: Maximum Assist (02/23/2019  1:00 PM)    UE Dressing Assist: Stand By Assist (02/18/2019  7:15 AM)          Summary of Changes in Care 02/24/2019:  Stable, VS controlled. No urinary incontinence, nausea or emesis during my visit. Marked slowed processing speed and dysarthria. Had some nausea reported feeling sick of his stomach during therapy.      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  -Goal long term BP from stroke perspective would be a little higher ideally around 110-140/70-90  ???             >???f/u OP with Mov Disorders neurology/requested   ?????????????????????             >???  continue ASA, Plavix, Statin for secondary stroke prevention               > BP management as below with some permissive hypertension to promote adequate perfusion  ???             >???consult OT, PT, SLP  ???  CARDIO:  Hx of HLD, HTN  Orthostatic Hypotension  -TTE normal EF, moderate LA dilation, no thrombus/shunt.  ???????????????????????????????????????>???continue rec Abd Binder and Ted Hose  ???????????????????????????????????????>???continue Midodrine, now 5mg  TID to maintain perfusion brain  ???????????????????????????????????????>???dc PTA flomax and Acei  ???????????????????????????????????????>???event monitor on d/c for further stroke risk factor assessment   ???????????????????????????????????????>???f/u cardio upon DC  ???  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  GI:  HCC, portal HTN/ascites, NASH  Hyperammonemia due to decompensated cirrhosis -Abdominal US shows hepatic mass  -Abdominal CT shows mass c/w hepatocellular carcinoma, cirrhosis, portal HTN, small ascites  ???????????????????????????????????????>???f/u GI OP for biopsy after DC from rehab/ requested appt (hold Plavix for at least 5 days prior to biopsy)  ???????????????????????????????????????>???continue lactulose (3-4 BM daily)   ???  ENDO:  T2DM  ???????????????????????????????????????> PTA Levemir 10 units QHS and metformin 500 mg BID  ???????????????????????????????????????>???hold metformin, lantus 8 units QHS, LDCF  ???  Pain Management: pain is well controlled, heat / ice PRN, topical medication and Tylenol PRN  Skin: Encourage the patient to continue to inspect their skin and perform pressure relief.  Bowel: continent of bowel  Bladder: monitor bladder continence, BVIs x3 until <173ml and ISC for >338ml  Nutrition: Current diet:  diabetic  Mental Health: consult neuropsychology to provide support / counseling; continue citalopram  DVT ppx: enoxaparin (LOVENOX) syringe 40 mg  QDAY  Code Status: Full Code  DC Planning:   > will need to see hepatology, biopsy must reschedule appt. F/u neuro needed.     Gareth Morgan, M.D.   Physical Medicine and Rehabilitation (PM&R)   Resident Physician, PGY-2   Pager: [phone]  02/24/2019   3:55 PM       SUBJECTIVE/ History:     I saw and examined the patient during the lunch hour.  He was comfortably seated in a chair by the window.  He was eating a pizza.  He reported to me that he had a good night of sleep, denies any urinary incontinence episodes overnight.  He does report good urinary output throughout the day.  He reports he did okay with therapies yesterday and this morning, however he did endorse to me that he felt some nausea when he was working with physical therapy this morning, denies any lightheadedness but he felt a little bit dizzy.  Patient reports that he had not had a bowel movement in a couple days, I instructed to him to order 1 of the suppositories that we have available PRN if needed.    OBJECTIVE/ Physical Exam:     Vitals: 02/23/19 2002 02/23/19 2217 02/24/19 0600 02/24/19 0800   BP: 124/65 103/56 (!) 151/78 (!) 150/95   BP Source: Arm, Left Upper Arm, Left Upper Arm, Right Upper    Pulse: 86 89 79 86   Temp: 36.8 ???C (98.2 ???F) 36.7 ???C (98.1 ???F) 36.6 ???C (97.9 ???F)    SpO2: 96% 95% 96% 97%   Weight:       Height:         Vitals:    02/23/19 1500   Weight:  72.1 kg (158 lb 15.2 oz)       Patient was calm, cooperative and in no distress, comfortably sitting by the chair.  Abdominal binder in place, bilateral TED hose in place.  He does have significant dysarthria with market decrease cognitive and processing speed.  However he had some difficulty to feed himself with pizza, he was able to articulate that he want me to call his nurse.  He did had good upper extremity movements and lower extremity movements, no acute neurological changes with motor function, ocular motions are conjugate and intact, no observed labored breathing, no abdominal tenderness to palpation.  No worsening of tremors.  Mood and affect appear congruent.  He was alert, and oriented.     RESULTS AND DATA REVIEWED:      Current Medication List:  Scheduled Meds:aspirin EC tablet 81 mg, 81 mg, Oral, QAM8  atorvastatin (LIPITOR) tablet 80 mg, 80 mg, Oral, QHS  citalopram (CeleXA) tablet 20 mg, 20 mg, Oral, QAM8  clopiDOGrel (PLAVIX) tablet 75 mg, 75 mg, Oral, QDAY  docusate (COLACE) capsule 100 mg, 100 mg, Oral, BID  enoxaparin (LOVENOX) syringe 40 mg, 40 mg, Subcutaneous, QDAY  insulin aspart U-100 (NOVOLOG FLEXPEN) injection PEN 0-12 Units, 0-12 Units, Subcutaneous, ACHS (22)  insulin glargine (LANTUS SOLOSTAR) injection PEN 8 Units, 8 Units, Subcutaneous, QHS(22)  lactulose oral solution 20 g, 20 g, Oral, TID  melatonin tablet 3 mg, 3 mg, Oral, QHS  midodrine (PROAMATINE) tablet 5 mg, 5 mg, Oral, TID  pantoprazole DR (PROTONIX) tablet 20 mg, 20 mg, Oral, QDAY  senna (SENOKOT) tablet 2 tablet, 2 tablet, Oral, QHS sodium chloride PF 0.9% injection 3 mL, 3 mL, Intravenous, Q8H*    Continuous Infusions:  PRN and Respiratory Meds:acetaminophen Q4H PRN, aluminum/magnesium hydroxide Q4H PRN, bisacodyL QDAY PRN, milk of magnesia (CONC) Q4H PRN, ondansetron Q6H PRN, traZODone QHS PRN    Last Bowel Movement Date: 02/22/19 (02/24/2019  9:04 AM)    Post-Void(PVR) Bladder Scan (mL): 96 milliliters (02/24/2019  6:11 AM)    Straight Cath (mL): 100 (02/11/2019  1:45 PM)      Results for orders placed or performed during the hospital encounter of 02/23/19 (from the past 24 hour(s))   POC GLUCOSE    Collection Time: 02/23/19 10:04 PM   # # Low-High    Glucose, POC 209 (H) 70 - 100 MG/DL   POC GLUCOSE    Collection Time: 02/24/19  6:58 AM   # # Low-High    Glucose, POC 120 (H) 70 - 100 MG/DL   CBC CELLULAR THERAPEUTICS    Collection Time: 02/24/19  7:43 AM   # # Low-High    White Blood Cells 5.7 4.5 - 11.0 K/UL    RBC 3.71 (L) 4.4 - 5.5 M/UL    Hemoglobin 11.7 (L) 13.5 - 16.5 GM/DL    Hematocrit 16.1 (L) 40 - 50 %    MCV 90.2 80 - 100 FL    MCH 31.6 26 - 34 PG    MCHC 35.0 32.0 - 36.0 G/DL    RDW 09.6 (H) 11 - 15 %    Platelet Count 126 (L) 150 - 400 K/UL    MPV 7.5 7 - 11 FL   BASIC METABOLIC PANEL CELLULAR THERAPEUTICS    Collection Time: 02/24/19  7:43 AM   # # Low-High    Sodium 139 137 - 147 MMOL/L    Potassium 4.0 3.5 - 5.1 MMOL/L    Chloride 106 98 - 110 MMOL/L  CO2 26 21 - 30 MMOL/L    Anion Gap 7 3 - 12    Glucose 126 (H) 70 - 100 MG/DL    Blood Urea Nitrogen 14 7 - 25 MG/DL    Creatinine 0.98 0.4 - 1.24 MG/DL    Calcium 8.4 (L) 8.5 - 10.6 MG/DL    eGFR Non African American >60 >60 mL/min    eGFR African American >60 >60 mL/min   POC GLUCOSE    Collection Time: 02/24/19 12:02 PM   # # Low-High    Glucose, POC 221 (H) 70 - 100 MG/DL       Chart reviewed, nursing notes and therapy notes reviewed.   Case discussed with the attending physician.       Gareth Morgan, M.D.   Physical Medicine and Rehabilitation (PM&R) Resident Physician, PGY-2   Pager: [phone]  [place]  02/24/2019   3:55 PM

## 2019-02-24 NOTE — Telephone Encounter
Spoke w/ pts wife and scheduled HFU w/ Carole Civil on 8/28 @ 1:00pm.

## 2019-02-24 NOTE — Progress Notes
Pt d/c at 1400. Stroke education provided, discussed and all questions answered. IV left in place per request of Jeanella Cara, Therapist, sports at [employer]. Discharge paperwork provided, discussed, and all questions answered.

## 2019-02-24 NOTE — Progress Notes
Pt's wife brought an eye drop Lumigan last night. Per wife pt takes the medic ation @ bed time. Pt already has Latanoprost scheduled at bedtime. But , pt's wife does not want him to take it. Will pass to the day nurse to get an order for Lumigan.

## 2019-02-24 NOTE — Progress Notes
I have reviewed the notes, assessment, and/or procedures performed by  Loren Racer, RN and concur with her/his documentation unless otherwise noted.

## 2019-02-24 NOTE — Progress Notes
PHYSICAL THERAPY     02/24/19 0800   Vitals*   Pulse 86   BP Patient Position Head of bed (Comment degree)   BP (!) 150/95  (with ted hose, so removed ted hose)   $$ O2 Delivery RA   SpO2 97 %   SpO2 Location Left;Finger, Second (Index)   Pain   Pain Scale No Pain   Precautions   Precautions   (compression - ted hose & binder when OOB)   Cognitive   Orientation Oriented x4   Patient Behavior Calm;Cooperative   Cognition Follows Commands;Decrease attention/ concentration   Home Environment   Ambulation Assist Independent Mobility at [employer] Level with Device;Assist Needed with Mobility-Related ADL's/Ambulation   Home Situation Lives with Family  (spouse & mother lives in their basement)   Type of Home House   Patient Owned Equipment roller walker, single point cane   Home Layout Two Level   Entry Stairs Details No stairs via garage entry   Entry Stairs # 0   In Home Stairs Details from garage entry, he is in the basement. He rides a stair glide, gets off on a landing & walks a few more steps to another stair glide that takes him to the main level. Stairs have 1 rail (on side of stair glide). He reports having a walker on both levels.    In Home Stairs # 13   Comment Prior to hospital admission, pt's wife would set up him in the morning & then leave for work. Patient would go down to the basement level, where he spent most of the day with his mother. Pt's mother has mobility challenges but is mentally intact.    Range of Motion   ROM Position Assessed Supine   ROM Method Active;Passive   LE ROM Bilateral;WFL   Strength   Gross Strength Grade 5/5  (distally, unable to finish rest of MMT exam 2/2 nausea)   Posture / Neurological   Head Control Independent   Posture Rounded Shoulders   Overall Tone Hypertonic;RLE;LLE   Clonus L Ankle None   Clonus R Ankle None   LLE Sensation/Proprioception Intact Light Touch   RLE Sensation/Proprioception Intact Light Touch   Subjective Subjective Patient presents asleep in bed with breakfast in front of him. He is agreeable to getting up to the chair to eat.    Bed Mobility   Bed Mobility: Rolling Right Minimum assistance   Bed Mobility: Rolling Left Minimum assistance   Bed Mobility: Supine to sit assist level Maximum assistance   Bed Mobility: Supine to sit type of assist With head of bed elevated   Bed Mobility: Sit to Supine Assist Level Maximum assistance   Bed Mobility: Sit to supine type of assist Assist with trunk;Assist with right lower extremity;With head of bed elevated   Bed Mobility Comments Assist x2 to boost up into bed.    Transfers   Transfer Comment Transfer not observed due to patient's nausea.    Activity   PT Therapeutic Activities Patient completed lower body dressing at bed level with assist to thread both legs; he achieved pants over hips by bridging & rolling side to side with verbal cues. Upon sitting he performed upper body dressing with assistance. He needed moderate assist for trunk balance as he demonstrates R or posterior LOB. Pt then became nauseaus and vomitted a small amount of bile. Nurse was notified & provided Zofran.    Assessment   Assessment Patient's evaluation was limited by nausea/emesis, and he  politely requested to defer further mobility. At current level of mobility he is maximal assist (no transfers or gait observed). He is appopriate for skilled therapies to maximize his safety with mobility upon discharge.    Plan   PT Plan Balance/vestibular training;Bed mobility training;Family / caregiver training;Gait training;Neuromuscular re-education;Stair training;Therapeutic exercise;Transfer training;Wheelchair mobility   Comments Assess OOB transfers, gait, stairs, wheelchair mobility, outcome measures, family training prior to discharge.    Recommendations   PT Discharge Recommendations Home with Assistance;Home Health Setting   Equipment Recommendations Too early to be determined Expected Discharge Date 03/10/19   Weekly Goals   Patient will perform sit to supine with Stand by assistance   Patient will perform supine to sit with Stand by assistance   Patient will complete sit to stand transfer with Moderate assistance   Patient will complete stand to sit transfer with Moderate assistance   Patient will complete stand pivot transfer with Moderate assistance   Patient will ambulate 150 feet;Moderate assistance;Least assistive device   Goal(s) for the Stay   Patient will perform Minimum assistance;household mobility;With caregiver assistance     Doretha Sou, PT, DPT

## 2019-02-24 NOTE — Progress Notes
SPEECH-LANGUAGE PATHOLOGY  COGNITIVE ASSESSMENT   ???  EVALUATION SUMMARY  A cognitive-communication evaluation was completed utilizing the Repeatable Battery for the Assessment of Neuropsychological Status, Form A (RBANS). At this time, Mitchell Lucero presents with moderate cognitive-communication deficits in the area of attention, memory, and complex reasoning. Performance on assessment today notably worse than most recent rehabilitation stay (admit 7/24), particularly in the domains of: visuospatial/constructional, language (poor semantic fluency), attention, and delayed memory. Presentation most notable for significantly slowed processing speed. Mitchell Lucero did not achieve full points on many tasks due to response times being over the time limit.   Additionally, Mitchell Lucero presents with mild-moderate dysarthria characterized by reduced respiratory drive and articulatory imprecision. Speech intelligibility judged to be 85-90% to this novel listener. Mitchell Lucero reports he feels his speech is consistent with baseline, not significantly changed from most recent CVA.   Mitchell Lucero will benefit from continued SLP services to address cognitive-communication deficits. Please see below for additional details.      Prognosis: Good  Plan: Continue Treatment Daily.  Results Reported to Physician: Yes(EMR)  ???  EVALUATION TO BE COMPLETED - RBANS ??? Repeatable Battery for the Assessment of Neuropsychological Status, Form A  Domain Subtest Total Score Index Score Classification Notes   Immediate Memory List Learning 13/40  65 Extremely low ???   ??? Story Memory 13/24 ??? ??? ???   Visuospatial/  constructional Figure Copy 14/20  58 Extremely low ???   ??? Line Orientation 10/20 ??? ??? ???   Language Picture Naming 10/10  57 Extremely low  ??? ???   ??? Semantic Fluency 7/40 ??? ??? ???   Attention Digit Span 6/16  49 Extremely low ???   ??? Coding 3/89  ??? ???   Delayed Memory List Recall 2/10  52 Extremely low ???   ??? List Recognition 13/20 ??? ??? ???   ??? Story Recall 5/12 ??? ??? ??? ??? Figure Recall 5/20 ??? ??? ???   Sum of Index Scores ??? ??? ??? ???281 ???   TOTAL SCALE ??? ??? ??? 50 <0.1 %ile   ???    ???  Objective*  Relevant Med Background: This is a 68 year old right-handed male, he is known to our rehabilitation medicine services.  He was recently discharged from our acute inpatient rehabitation unit 2 days ago.  He originally presented to [facility] hospital for an episode of dysarthria with bilateral lower extremity weakness and tremors.  He has a history of frequent falls in the past.  He was diagnosed with a left pontine stroke in June 2020.  He does have a past medical history of diabetes, hypertension, transient ischemic attack, weight loss, tremors, hyperlipidemia, current orthostatic hypotension episodes, portal hypertension, hyper ammonia due to decompensated cirrhosis, hepatic mass compatible with hepatocellular carcinoma with ascites, type 2 diabetes as well as previous strokes.  Mitchell Lucero also had a ACDF cervical spine surgery in the past.  He originally presented to our acute inpatient rehabitation unit on 02/04/2019 for rehabilitation of a left pontine stroke, late effects of cerebrovascular accident, associated dysarthria, right upper extremity weakness with tremors, right lower extremity weakness, impaired gait, mobility, ADLs, impaired balance with risk of falls and impaired endurance and cognition.  His previous inpatient rehab hospital stay was complicated by hypotension, dizziness, lightheadedness.  He he was progressing well with therapies during his last hospitalization, however the day prior to discharge he started to decline with mobility and therapies, reported significant fatigue, nausea, vomiting, trunk instability, lethargy, nystagmus, he had a quick decline in his  neurological status, worsening speech, disorientation.  Stroke code was activated and the Mitchell Lucero was transferred to [facility] emergency department.  Stroke work-up was performed, the Mitchell Lucero was found to have a new acute/subacute right superior cerebellar stroke.  A CTA head and neck showed no evidence of new occlusions, but did show moderate atherosclerosis of vertebral arteries and superior cerebellar arteries.  MRI of the brain confirmed acute to subacute right superior cerebellar infarct, echocardiogram revealed a left ventricle ejection fraction of 55 to 60%, reported on 7/21 with grade 1 diastolic dysfunction.  Mitchell Lucero did had a EEG due to encephalopathy which was consistent with mild to moderate bihemispheric dysfunction of unspecific etiology.  Mitchell Lucero was discharged by the neurology service back to acute inpatient rehabitation on his current dose of Midodrine 5 mg 3 times daily, recommended to continue with midodrine 5 dose 3 times daily to maintain cerebellar and cerebral perfusion.    MRI 02/22/2019:  IMPRESSION   1. Minimal thin enhancement at the left pons wedge-shaped signal   abnormality. No enhancement of the adjacent left middle cerebellar   peduncle FLAIR hyperintensity. The attending differential consideration is   a slowly evolving subacute to chronic infarct. Neoplasm or sequelae of   infectious or demyelinating etiology are thought less likely. Attention on   follow-up is suggested.   2. No enhancement of the right middle cerebellar peduncle FLAIR/diffusion   abnormality consistent with an infarct    Lives With: Spouse  Receives Help From: Family  Psychosocial Status: Agreeable to participate with encouragement  Persons Present: None  ???  Subjective*  Pain: Mitchell Lucero has no complaint of pain  ???  Education*  Persons Educated: Mitchell Lucero  Barriers To Learning: Cognitive Deficits  Interventions: Staff educated  Teaching Methods: Verbal  Topics: Memory, cognitive impairments  Mitchell Lucero Response: Verbalized Understanding  Goal Formulation: With Mitchell Lucero  ???  Therapist: Herbert Spires, MS,CCC-SLP   Date: 02/24/2019

## 2019-02-25 LAB — POC GLUCOSE
Lab: 174 mg/dL — ABNORMAL HIGH (ref 70–100)
Lab: 187 mg/dL — ABNORMAL HIGH (ref 70–100)
Lab: 182 mg/dL — ABNORMAL HIGH (ref 70–100)
Lab: 203 mg/dL — ABNORMAL HIGH (ref 70–100)

## 2019-02-25 LAB — BASIC METABOLIC PANEL CELLULAR THERAPEUTICS: Lab: 138 MMOL/L — ABNORMAL HIGH (ref 137–147)

## 2019-02-25 LAB — CBC CELLULAR THERAPEUTICS: Lab: 5.6 K/UL — ABNORMAL LOW (ref >60)

## 2019-02-25 MED ORDER — LATANOPROST 0.005 % OP DROP
1 [drp] | Freq: Every evening | OPHTHALMIC | 0 refills | Status: DC
Start: 2019-02-25 — End: 2019-02-27
  Administered 2019-02-25: 06:00:00 1 [drp] via OPHTHALMIC

## 2019-02-25 MED ORDER — ONDANSETRON HCL 4 MG PO TAB
4 mg | Freq: Before meals | ORAL | 0 refills | Status: DC
Start: 2019-02-25 — End: 2019-02-27
  Administered 2019-02-25 – 2019-02-27 (×7): 4 mg via ORAL

## 2019-02-25 MED ORDER — SODIUM CHLORIDE 0.9 % FLUSH
3-5 mL | Freq: Three times a day (TID) | 0 refills | Status: DC
Start: 2019-02-25 — End: 2019-03-01

## 2019-02-25 NOTE — Progress Notes
SPEECH-LANGUAGE PATHOLOGY       02/25/19 1500   Behavior   Comments* Pt seen in his room for therapy this date. Pt sitting upright in his wheelchair. Pt pleasant for therapy. Slow processing noted. Pt reported feeling a little bit better this early afternoon than in the morning.    Problem Solving Goals   Therapy Activity Comments Attempted portions of the ALFA this date. However, pt appeared to require max A to complete reading tasks at the sentence level, so activity ceased in favor of less complex tasks that better fit pt's current functioning level.    Reading Goals   Therapy Activities Functional reading   Functional Reading Menus;Moderate prompting - patient able to 25-49% of the time   Therapy Activity Comments To address reading, pt made breakfast selection from the entree portion. Noted prolonged processing. Grossly intact reading for phrases/words.    Patient Will Demonstrate Functional Reading Comprehension of Signs, Menus, Instructions, Labels, etc. with Progressing   Patient Will Exhibit Functional Reading Skills with Progressing   Motor Speech Goals   Therapy Activity Comments Significantly reduced volume noted this session.       Peri Maris, MA, CCC-SLP

## 2019-02-25 NOTE — Progress Notes
SPEECH-LANGUAGE PATHOLOGY     02/25/19 1100   Orientation Goals   Therapy Activities Orientation log score   Orientation Log Score (Out of 30) 25/30   Therapy Activity Comments Patient initially did not refer to environmental cues. Patient will need to have his glasses on and orientation information printed using large print on his dry erase board in his room.   Patient Will Perform on the Orientation Log with Progressing   Patient Will be Oriented (to Person, Place, Time and Situation) to Improve Safety and Awareness for Daily Living with Progressing   Problem Solving Goals   Therapy Activities Problem solving/reasoning;Time concepts   Problem Solving/Reasoning Visual;Low level;Moderate prompting - patient able to 25-49% of the time   Time Concepts Low level;Moderate prompting - patient able to 25-49% of the time   Therapy Activity Comments Reviewed his therapy schedule. Asked simple time concepts questions.  Moderate to severe difficulty observed figuring how much time he had in between his therapies if it was beyond an hour or less than an hour.  Picture task: What's wrong with this picture:  70% accy moderate cues. Good attention to task.  At times patient able to identify the problem when given additional cues.    Patient Will Provide Appropriate Solutions to Problems of Daily Living with Progressing   Patient Will Be Able to Problem Solve Safety and Awareness for Daily Living Skills with Progressing   Motor Speech Goals   Therapy Activity Comments Patient presenting with a min dysarthria with functional intelligibility with knowledge of the topic.  Speech was slow with imprecise articulation and reduced volume.     Short Term Goals Patient will adhere to strategies during conversation to improve speech intelligibility with   Patient Will Improve Speech Intelligibility for Communication with [place] M.A.L/CCC/SLP

## 2019-02-25 NOTE — Progress Notes
PHYSICAL THERAPY     02/25/19 1000   Subjective   Subjective Patient agrees to therapy after encouragement.  Patient reports he needs to use the restroom at begining of the session.   Bed Mobility   Bed Mobility: Supine to sit assist level Minimum assistance   Bed Mobility: Supine to sit type of assist With head of bed elevated;Assist with trunk   Bed Mobility Comments Patient up in the chair at the end of the session.   Transfers   Transfer: Assistive Device None   Transfer: Sit to stand assist level Moderate assistance;Minimum assistance  (Variable)   Transfer: Sit to stand type of assist For safety;Assist with Right lower extremity;Blocking of Right lower extremity;Facilitation of trunk;Increased time to complete   Transfer: Stand to sit assist level Moderate assistance   Transfer: Stand to sit type of assist For safety;Facilitation of weight shift;Facilitation of trunk;Increased time to complete   Transfer: Stand pivot assist level Maximum assistance   Transfer: Stand pivot type of assist Blocking of Right lower extremity;Assist with Right lower extremity;For safety;Facilitation of weight shift;Requires verbal cues for sequencing   Activity   PT Therapeutic Activities Transfers to the toliet for a BM.  Patient had already had bowel movement in the brief.  Assisted patient with changing shorts.  Patient required total assist for clean up and hygene.     Assessment   Assessment Session was limited due to bowel movement at begeining of session.  Patient required total assist for lower extremity dressing and peri hygene.     Plan   Comments Assess OOB transfers, gait, stairs, wheelchair mobility, outcome measures, family training prior to discharge.      Tonita Phoenix DPT, PT

## 2019-02-25 NOTE — Consults
CLINICAL NUTRITION                                                        Clinical Nutrition Initial Assessment    Name: Mitchell Lucero        MRN: [id number]          DOB: [birth year]          Age: 68 y.o.  Admission Date: 02/23/2019             LOS: 2 days        Recommendation:  Continue diet as ordered     Comments:  Pt is a 68 y.o. male readmitted to IPR after RR with PMH of HTN, DM2, C6-C7 ADCF in 1997, and recent left pontine stroke in 12/18/2018 who was transferred to [facility] for a transient episode of dysarthria for 1.5 hrs in the morning of 7/20 associated with R arm large amplitude irregular, involuntary movement and both leg shaking with retained awareness. Increasing number of falls since discharge from IPR. Unintentional wt loss of 30# x1 year. Clinical Nutrition following previously for poor appetite. During this admission pt was also dx with [facility], likely secondary to NASH. Hepatology and oncology following. AFP WNL. Pt was taking metformin PTA, but no longer taking per previous notes. Pt and his wife have previously received diabetic diet education. Pt???s wife managing meals and DM meds since his stroke. She has written materials and reports confidence preparing a carbohydrate controlled diet. Pt intake intermittent. RD will continue to monitor.    Nutrition Assessment of Patient:  Desired Weight: 63.7 kg  BMI (Calculated): 26.45; BMI Categories Adult: Over Weight: 25-29.9; Appearance: Appropriate  Pertinent Allergies/Intolerances: none  Pertinent Labs: reviewed; Pertinent Meds: reviewed; Unintentional Weight Loss: 20% in 1 year (significant)  Oral Diet Order: Diabetic 2100-2400 Kcal/day (75 g carb/meal, 30 g carb/HS snack);    Current Oral Intake: Marginally Adequate  Estimated Calorie Needs: 1590-1911  Estimated Protein Needs: 76-82    Malnutrition Assessment:   Does not meet criteria     Nutrition Focused Physical Assessment:   Loss of Subcutaneous Fat: No;  ; Muscle Wasting: Yes; Severity: Mild; Location: Deltoid, Interosseous, Temple  Edema: No;  ;    Pressure Injury: none noted    Nutrition Diagnosis:  Inadequate oral intake  Etiology: recent poor appetite; slowly improving  Signs & Symptoms: pt and wife reports     Intervention / Plan:  [place] weight, labs, meds, Gi symptoms, PO intake     Goals:  Patient to consume >75% of meals/supplements  Time Frame: Throughout stay        Francee Nodal, MS, RD, LD   [place]: [phone]  Office Phone: [phone]

## 2019-02-25 NOTE — Progress Notes
OCCUPATIONAL THERAPY     02/25/19 0715   Cognitive   Patient Behavior Flat affect;Withdrawn   Cognition Follows Commands   Subjective   Subjective Patient demonstrating slow initiation, minimal verbalizations and eyes were closed most of the session.    Eating   Eating - Scoop Assist No   Eating Assist Stand By Assist   Eating Position HOB up ____ degrees  (chair mode)   Eating Comments Required set up for applying butter and jelly to toast.  Able to feed self 2 bites of toast with verbal encouragement.  Did not eat anything else during this session due to slow speed.  PCA assisting patient at end of OT session.    Upper Body Dressing   UE Dressing Assist Total Assist   Upper Dressing Position Sitting edge of bed   Upper Dressing Garments/Equipment Abdominal binder   Upper Dressing Comments Retropulsive sitting edge of bed, requiring maximum assist for sitting balance and assist for all aspects of UB dressing.      Lower Body Dressing   LE Dressing Assist Total Assist   Lower Dressing Position HOB up ___ degrees   Lower Dressing Garments/Equipment Compression stockings   Lower Dressing Comments Required assist for all aspects of donning shorts and socks. Unable to pull over hips while bridging this date.    Bed Mobility   Bed Mobility: Supine to sit assist level Maximum assistance   Bed Mobility: Supine to sit type of assist Assist with Bilateral lower extremities;Assist with trunk;With head of bed elevated   Assessment   Assessment Patient demonstrating poor sitting balance and was retropulsive.  Requires total assist for dressing.  Minimal verbalizations and frequently closed eyes during OT session.     Plan   Plan Comments Outcome measures, as able, including ARAT and BITS; ADL's, functional transfers and sitting balance.      Molli Knock, OTR/L

## 2019-02-25 NOTE — Rehab Care Plan
Physical Medicine & Rehabilitation Individualized Overall Plan of Care     Date of Service:  02/25/2019   Mitchell Lucero is a 68 y.o. male.     DOB: [birth year]                  MRN#:  [id number]  Insurance:  Medicare Repl  Date of Admission:  02/23/2019    Hospital Course: Mitchell Lucero???is a 68 y.o.???male with???a past medical history of hypertension, diabetes, CA6-7 ADCF in 1997 and recent stroke in June 2020. Patient???presented???as a???transfer to [facility]???for transient episode of dysarthria associated with right arm large amplitude,???irregular involuntary movement in both legs shaking yet retained awareness. Patient has been previously having some increasing number of falls at home. Patients hospital course has been complicated by anemia, thrombocytopenia, hyponatremia, as well as impaired mobility and activities of daily living.???  ???  Patient was [facility] to[facility]. He was making good progress and on track for 8/11 discharge when on 8/10 he had???decline in neurological status, functional mobility, worsening slurred speech, lethargic, still able to answer questions, oriented, but actively vomiting, nauseated, unable to track external ocular motions with the left eye as well as he had in the past, and with equivocal left-sided pronator drift but worsening tremors. ???He was transferred back to acute for workup. Patient had an MRI on 8-10 which showed small acute to subacute infarcts at the right superior cerebellum.  Patient was discharged by the neurology service back to acute inpatient rehabitation on his and increased dose of Midodrine 5 mg 3 times daily, recommended to continue with midodrine 5 dose 3 times daily to maintain cerebellar and cerebral perfusion.  ???  Patient has been working with physical and occupational therapy since 02-22-19. Patient is anticipated to return to home at the supervision level of care.      Rehab Diagnosis: stroke??? Medical Course since IRF Admission:    The patient???s clinical course since admission has been stable.  The patient has been able to participate fully in the rehabilitation program.    Medical Prognosis: Fair    The patient has a reasonable likelihood of fully participating in and completing the IRF stay based on ability to manage medical issues including orthostatic hypotension, nausea, vomiting, thrombocytopenia, urinary incontinence.  There is a reasonable expectation of several years of productive life in a community setting in which they will benefit from this stay.     This patient meets medical necessity requiring the intensity of therapy available at the [facility]. The patient can participate in and can fully benefit from the services offered in the IRF setting including 24 hour rehabilitation nursing, daily oversight from the Physiatrist, and complex interdisciplinary rehab as noted below.    Functional Independence Measures (FIMS) Current Level of Function  Evaluation FIMS Current FIMS        Grooming FIM: 5 - Set up  Bathing FIM: 1 - Total assistance, patient performs less than 25% of grooming/bathing/dressing/toileting tasks  Dressing - Upper Body FIM: 1 - Total assistance, patient performs less than 25% of grooming/bathing/dressing/toileting tasks  Dressing - Lower Body FIM: 1 - Total assistance, patient performs less than 25% of grooming/bathing/dressing/toileting tasks  Toileting FIM: 6 - Modified independence - Device  Bladder FIM: 4 - Patient expends 75% or more effort  Bowel FIM: 6 - No bowel movement, medication  Transfers FIM: 1 - Total assistance, two or more people  Toilet Transfers FIM: 1 -  Total assistance, two or more people     Shower Transfers FIM: 1 - Total assistance, two or more people  Gait: 0 - Activity did not occur - Other (comment)(nausea)  Wheelchair FIM: 3 - Moderate assistance, patient expends 50-74% effort, greater than or equal to 150 feet Stairs FIM: 0 - Activity did not occur - Other (comment)  Comprehension FIM: 5 - Standby prompting. Understands abstract and/or basic information greater than 90% of the time, prompting less than 10%  Expression FIM: 5 - Standy prompting - Able to express abstract and/or basic information >90% of the time, prompting <10%  Social Interaction FIM: 6 - Modified independence - Able to interact in most situations and no prompting required  Problem Solving FIM: 3 - Moderate prompting - Able to solve routine problems 50-74%. Needs direction less than half of the time to initiate, plan or complete daily activities  Memory FIM: 2 - Maximal promting - Able to recognize people frequently encountered, remembers daily routines, responds to requests of others 25-49% of the time, needs prompting less than half of the time      Grooming FIM: 5 - Set up  Bathing FIM: 1 - Total assistance, patient performs less than 25% of grooming/bathing/dressing/toileting tasks  Dressing - Upper Body FIM: 1 - Total assistance, patient performs less than 25% of grooming/bathing/dressing/toileting tasks  Dressing - Lower Body FIM: 1 - Total assistance, patient performs less than 25% of grooming/bathing/dressing/toileting tasks  Toileting FIM: 1 - Total assistance, patient performs less than 25% of grooming/bathing/dressing/toileting tasks  Bladder FIM: 4 - Patient expends 75% or more effort  Bowel FIM: 6 - No bowel movement, medication  Transfers FIM: 1 - Total assistance, two or more people  Toilet Transfers FIM: 1 - Total assistance, two or more people     Shower Transfers FIM: 1 - Total assistance, two or more people  Gait: 1 - Toal assistance, two or more people, < 50 feet  Wheelchair FIM: 3 - Moderate assistance, patient expends 50-74% effort, greater than or equal to 150 feet  Stairs FIM: 0 - Activity did not occur - Safety  Comprehension FIM: 5 - Standby prompting. Understands abstract and/or basic information greater than 90% of the time, prompting less than 10%  Expression FIM: 5 - Standy prompting - Able to express abstract and/or basic information >90% of the time, prompting <10%  Social Interaction FIM: 5 - Supervision required - Able to interact appropriately >90% of the time, prompting <10%  Problem Solving FIM: 3 - Moderate prompting - Able to solve routine problems 50-74%. Needs direction less than half of the time to initiate, plan or complete daily activities  Memory FIM: 2 - Maximal promting - Able to recognize people frequently encountered, remembers daily routines, responds to requests of others 25-49% of the time, needs prompting less than half of the time     Physical Therapy Goals  Patient will perform: Minimum assistance, household mobility, With caregiver assistance    Occupational Therapy Goals  Pt will perform basic care and transfer with: Minimum assistance(least restrictive device, assistance with IADLs )    Speech Therapy Goals        Patient Will be Oriented (to Person, Place, Time and Situation) to Improve Safety and Awareness for Daily Living with: Progressing     Patient Will Use Appropriate Memory Strategies to Recall Recent Events, Express Needs, and Maintain Safety for Daily Living with: Minimum assist     Patient Will Demonstrate Attention Skills to Improve  Safety with Daily Activities and Communication with: Minimum assist     Patient Will Be Able to Problem Solve Safety and Awareness for Daily Living Skills with: Progressing                 Patient Will Exhibit Functional Reading Skills with: Progressing           Patient Will Improve Speech Intelligibility for Communication with: Progressing                                        Additional therapeutic disciplines may be included during this stay if indicated during interdisciplinary communication and will be noted in the daily progress notes when relevant.  Social Work will address discharge planning needs. Rehabilitation Plan     Patient will receive Physical therapy, Occupational therapy and Speech therapy each 60 minutes a day, 5 days a week for a total of 3 hours daily (minimum) for the duration of the rehabilitation stay within an interdisciplinary rehabilitation program with case manager/social worker, dietician, neuropsychologist, rehab nursing and PM&R oversight.    Rehabilitation Prognosis: Good, anticipate steady functional gains with PT, OT, and SLP to return home with family support.  Tolerance for three hours of therapy a day: Good, patient is tolerating 3 hours of therapy per day, as required.    Goals/Barriers/Facilitators  Family / Patient Goals: Patient would like to regain strength and endurance to safely return home.   Mobility Goals: Updated per therapy evaluation as above  Activities of Daily Living (ADLs) Goals: Updated per therapy evaluation as above.  Cognition / Communication Goals: Updated per therapy evaluation as above.    Barriers & Interventions:???  Caregiver Apprehension:???Arrange caregiver support and discuss barriers and patient progress with caregivers when appropriate.  High Burden of Care:???Initiate interdisciplinary rehabilitation to improve functional independence and reduce burden of care.  Medication Education:??????Pharmacist and nursing staff to provide education to patient and family regarding medication side effects, special precautions, and safe administration.  Facilitators:???good home setup, good family / social support, patient motivation, improving strength / endurance and improving medical condition  ???  Discharge Planning  Expected Length of Stay???14???day(s)  Expected Discharge Disposition???Home  Expected Discharge Needs:???Patient would benefit from ongoing???physical, occupational and speech therapy???at the outpatient???level of care. Patient already owned a stair glide, grab bars in shower, shower chair and a walker???prior to hospitalization; however physical and occupational therapy will determine most appropriate equipment needs???upon discharge to home to improve upon safety and decrease risk of falls.      The patient should reach their current goals by noted projected discharge date.    This plan of care was formulated based upon a review of the progress this patient made with therapies during the acute hospital stay, the goals set by the inpatient rehabilitation therapists at the time of their initial assessment, and my expertise in caring for patients with this rehabilitation diagnosis and accompanying comorbidities.    Su Monks, MD  02/25/19 4:17 PM

## 2019-02-25 NOTE — Progress Notes
PHYSICAL THERAPY     02/25/19 1415   Vitals*   Activity At Rest   BP Patient Position Chair   BP 133/74 (SBP dropped to with standing)   $$ O2 Delivery RA   SpO2 98 %   Pain   Pain Scale No Pain   Precautions   Precautions   (compression on for mobility at all times, SBP goal >120)   Strength   Strength Position Assessed Seated   R LE WNL N   L LE WNL N   Posture / Neurological   R LE Tone Assessed Using Modified Ashworth Scale (MAS) Y   R Knee Flexors 0   No increase in muscle tone   R Knee Extensors 3   Considerable increase in muscle tone ??? passive movement difficult   R Ankle Dorsiflexors 2   Marked increase in muscle tone but affected part(s) easily moved   R Ankle Plantarflexors 1   Slight increase manifested by catch and release or min resist   L LE Tone Assessed Using Modified Ashworth Scale (MAS) Y   L Knee Flexors 0   No increase in muscle tone   L Knee Extensors 0   No increase in muscle tone   L Ankle Dorsiflexors 0   No increase in muscle tone   L Ankle Plantarflexors 0   No increase in muscle tone   Clonus L Ankle None   Clonus R Ankle None   Subjective   Subjective Patient presents asleep in bed; he wants to use the bathroom.    Bed Mobility   Bed Mobility: Supine to sit assist level Maximum assistance   Bed Mobility: Supine to sit type of assist Assist with trunk;Assist with Bilateral lower extremities;With head of bed flat;No rail   Transfers   Transfer: Insurance claims handler: Sit to stand assist level Moderate assistance   Transfer: Sit to stand type of assist For safety   Transfer: Stand to sit assist level Moderate assistance   Transfer: Stand to sit type of assist For safety   Transfer: Stand pivot assist level Moderate assistance;With two person   Transfer: Stand pivot type of assist Requires cues for positioning of device;Requires cues for positioning of feet;Requires verbal cues for sequencing;Increased time to complete Transfer: Squat pivot assist level Moderate assistance;With two person   Transfer: Squat pivot type of assist Facilitation of trunk;Facilitation of weight shift   Transfer Comment Patient requires moderate verbal cueing & extra time for sequencing to complete a stand pivot transfer. He needs assist for walker management as well.    Nutritional therapist Technique Stand pivot to left   Toilet Transfer Assist WIth two person   Acupuncturist Grab bar - left   Toilet Transfer Comments Patient transferred to the toilet but then reported he did not have to toilet.    Gait   Gait Distance 5 feet  (+ 20 ft for TUG)   GAIT: Assist Level Moderate assistance;Two person assistance  (CGA of 2nd person)   GAIT: Type of Assist For safety;Facilitation of weight shift;Requires cues for placement of device   Gait: Assistive Device Roller Walker   Gait: Patterns Decreased gait velocity;Trunk lean forward;Trunk lean right;Variable step length;Narrow base of support   Gait: Deviation Right Decreased knee extension in stance phase;Decreased stride length;Decreased weight bearing  (adduction of limb placement)   Activity Limited By Complaint of fatigue   Gait Comment Pt reported he was  too tired to work on gait this afternoon.    Stairs   Stair: Assist Level Not safe to attempt   Wheelchair   Wheelchair: Distance 170 feet   Wheelchair: Assistance Level Moderate assistance   Wheelchair: Propulsion Method Bilateral upper extremity;Manual wheelchair   Wheelchair Comments Patient required moderate to maximal verbal cues for L sided attention and coordination of the UEs to propel, turn & for continuous propulsion. He needed physical assist to manage 1 turn.    Outcome Measures   6 Minute Walk: Feet in 6 Minutes 0   6 Minute Walk: Comment Unable to walk for 6 min.    10 Meter Walk   (unable to complete 23m distance)   Berg Balance Scale Assessed   Sit to Stand 0   Standing Unsupported 0 Sitting w/o Back Support, Feet Supported 4   Stand to Sit 0   Transfers 0   Standing Unsupported Eyes Closed 0   Standing Unsupported Feet Together 0   Reaching Forward While Standing 0   Pick Up Object From Floor While Standing 0   Standing, Looking Over L/R Shoulders 0   Turn 360 Degrees 0   Alternate Foot on Step, Standing [place] 0   Tandem Standing 0   Single Leg Stance 0   Berg Balance Score 4 out of 56   Berg Fall Risk At Risk for Falls   Timed Up and Go Assessed   Timed Up and Go: Standard (seconds) 175.94   Timed Up and Go: Assistance Used With two person   Timed Up and Go: Assistive Device used [employer] Up and Go Fall Risk  >14 sec increased risk of falls in older stroke patients.    FIST (FUNCTION IN SITTING TEST) Assessed   Anterior Nudge 4   Posterior Nudge 4   Lateral Nudge to Dominant Side 4   Static Sitting 4   Sitting, Shake NO 4   Sitting, Eyes Closed 4   Sitting, Lift Foot 2   Pick up Object from Behind 4   Forward Reach 2   Lateral Reach  2   Pick up Object from Floor 2   Posterior Scooting 2   Anterior Scooting 2   Lateral Scooting to Dominant Side 1   FIST Total Score (out of 56) 41   Function in Sitting Test Comment 41/56  (R side dominant)   Balance   Balance: Sitting Static Fair +  (R side lean but stable)   Balance: Sitting Dynamic Poor +   Balance: Standing Static Unable   Balance: Standing Dynamic Unable   Assessment   Assessment Patient is more impaired since most recent rehab admission less than a week ago. His prior TUG time was 36 seconds with SBA and it is now 176 seconds with assist x2 and significant gait deviations. He demonstrates RLE adduction, a narrow BOS and a R side lean with gait. He has more diffuculty with walker management. He did experience a drop in SBP, so we may need to consider a tighter abdominal binder. He has also had a decline in his berg balance score, which was assessed at 15/56 on 7/28 and is now 4/56. He is appropriate for skilled therapy to maximize his rehab potential and decrease burden of care.    Plan   Comments Gait with compression (SBP goal >120), monitor vitals for gait (usually orthostatic),  transfer safety, HIGT & variable gait as patient tolerates.    OTHER   R  Hip Flexion 2+/5   R Knee Flexion 4+/5   R Knee Extension 4-/5   R Ankle Dorsiflexion 5   R Ankle Plantarflexion 5   L Hip Flexion 2/5   L Knee Flexion 4/5   L Knee Extension 4/5   L Ankle Dorsiflexion 5   L Ankle Plantarflexion 5     Doretha Sou, PT, DPT

## 2019-02-26 LAB — POC GLUCOSE
Lab: 161 mg/dL — ABNORMAL HIGH (ref 70–100)
Lab: 90 mg/dL (ref 70–100)
Lab: 144 mg/dL — ABNORMAL HIGH (ref 70–100)
Lab: 154 mg/dL — ABNORMAL HIGH (ref 70–100)

## 2019-02-26 LAB — CBC CELLULAR THERAPEUTICS: Lab: 5.3 K/UL — ABNORMAL HIGH (ref >60)

## 2019-02-26 LAB — BASIC METABOLIC PANEL CELLULAR THERAPEUTICS: Lab: 139 MMOL/L — ABNORMAL LOW (ref 137–147)

## 2019-02-26 MED ORDER — BIMATOPROST 0.01 % OP DROP
1 [drp] | Freq: Every day | OPHTHALMIC | 0 refills | Status: DC
Start: 2019-02-26 — End: 2019-03-09

## 2019-02-26 MED ORDER — PATIENTS OWN MEDICATION
1 | Freq: Every day | OPHTHALMIC | 0 refills | Status: DC
Start: 2019-02-26 — End: 2019-02-27

## 2019-02-26 NOTE — Progress Notes
SPEECH-LANGUAGE PATHOLOGY     02/26/19 0900   Pain   Pain Scale No Pain   Behavior   Comments* Alert, cooperative. Tx completed w/ pt sitting in bed. Pt just finishing AM meal.    Orientation Goals   Therapy Activities Orientation log score   Orientation Log Score (Out of 30)   (24/30)   Therapy Activity Comments Initiated putting on glasses to read environmental cues after SLP cued pt to look for what might tell him the specific date.    Patient Will Perform on the Orientation Log with Progressing   Patient Will be Oriented (to Person, Place, Time and Situation) to Improve Safety and Awareness for Daily Living with Progressing   Attention Goals   Therapy Activities Sustained attention   Sustained Attention 21-30 minutes;Quiet environment   Therapy Activity Comments Ongoing assessment utilizing CADL-3 initiated this session.  Will continue to complete during next session and find total     Marlowe Alt, MA,L/CCC-SLP  Voalte: [phone]

## 2019-02-26 NOTE — Progress Notes
OCCUPATIONAL THERAPY     02/26/19 1030   Range of Motion   ROM Method Active   UE ROM Bilateral;WFL   OT ROM Comments Increased time to perform ROM sequencing.   Strength   Strength Position Assessed Seated   Overall Strength Generalized Weakness;Able to Move All Joints Independently Through Available ROM   Gross Strength Grade 4/5   OT Strength Comment Generalized weakness noted   Subjective   Subjective Pt seated bedside chair start/end of session, tabs alarm active, all needs in reach.   Transfers   Transfer: Engineer, manufacturing systems: Sit to stand assist level Minimum assistance   Transfer: Sit to stand type of assist For safety;Facilitation of hip extension;Requires cues for positioning of hands   Transfer: Stand to sit assist level Moderate assistance   Transfer: Stand to sit type of assist Facilitation of weight shift;For safety;Increased time to complete   Transfer: Stand pivot assist level Maximum assistance;With two person   Transfer: Stand pivot type of assist Requires verbal cues for sequencing;Requires cues for positioning of hands;Requires cues for positioning of feet;Requires cues for positioning of device;Facilitation of weight shift;Increased time to complete   Transfer: Squat pivot assist level Moderate assistance;With two person   Transfer: Squat pivot type of assist Facilitation of weight shift;Requires cues for positioning of hands   Transfer Comment Pt with maximal difficulty sequencing steps/positioning self and weight shifting during stand pivot transfer with RW this date. Much improved ease with squat pivot.   Activity   Therapeutic Activities Transfers as described above. Assessment of UE and grip/fine motor coordination (see below).   OT Outcome Measures   OT Outcome Measures OT 9-Hole Peg Test;OT Dynamometer   9-Hole Peg Test    Male-Right 3:07.32   Male-Left 2:12.33   9-Hole Peg Test Interpretation maximally impaired BUE [facility]   Dynamometer   Male  Yes Male-Right 22,21, 20   Male-Left 25, 29, 29   Dynamometer Interpretation Although functional, significantly impaired grip strength as compared to norms for pt age/gender.   Assessment   Assessment Pt will benefit from exercises/activity to promote B grip strength and fine motor coordination. Stand pivot transfers more complicated for pt to perform vs squat pivot due to impaired seeuqncing and initiation of steps.   Plan   Plan Comments LE dressing, toileting, balance during ADLS functional cognition, fine motor coordination/grip strengthening     Beverly Gust, OTR/L

## 2019-02-26 NOTE — Progress Notes
OCCUPATIONAL THERAPY     02/26/19 1400   Subjective   Subjective I need to go to the bathroom.   Grooming   Grooming - Wash Both Hands Assist No   Grooming Assist Stand By Assist   Grooming Position Sitting in chair   Grooming Comments cues for initiation of grooming task and increased time to perform. Cues for upright midline seated position in w/c as pt leans toward R.   Toileting   Toileting-Adjusting clothing BEFORE using toiet, commode, bedpan or urinal Assist Yes   Toileting-Wipe Self Assist No   Toileting-Adjust clothing AFTER using toilet, commode, bedpan or urinal Assist Yes   Toileting Assist Maximum Assist   Toileting Position Seated   Toileting Equipment Grab bar - right   Toileting Comments Spouse assists for pants up/down while OT assists to steady/position pt in standing during clothing mgmt.   Nutritional therapist Technique Stand pivot to left   Control and instrumentation engineer For safety;Maximum assistance   Dentist bar - left   Toilet Transfer Comments Lift/lower assist and max cues for weight shif/position of hips/feet during transfer as pt with poor motor planning and ability to reposition LLE.   Transfers   Transfer: Sit to stand assist level Minimum assistance   Transfer: Sit to stand type of assist For safety;Increased time to complete;Requires cues for positioning of hands   Transfer: Stand to sit assist level Minimum assistance   Transfer: Stand to sit type of assist For safety;Increased time to complete;Requires cues for positioning of hands   Transfer: Stand pivot assist level Maximum assistance   Transfer: Stand pivot type of assist For safety;Facilitation of weight shift;Requires cues for positioning of hands;Requires cues for positioning of feet;Requires verbal cues for sequencing   Transfer Comment Pt completes stand pivot x 2 and sit<>stand x 6 during this session.   Activity   Therapeutic Activities Toileting/grooming tasks as detailed above. Sit<>stand transfers with use of table for support x 4, while weight shifting, hand clapping and balloon batting with one UE support on table.Pt able to stand x 1 min before requiring seated rest break, and requires mod vc for upright positioning and hand placement when standing. For improved B grip and FMC, pt issued t-putty and retrieves beads x 10 with use of pincer grasp pattern. Pt completes pinch and grip strengthening exercises to follow.   Assessment   Assessment Pt will benefit from exercises/activity to promote B grip strength and fine motor coordination. Stand pivot transfers more complicated for pt to perform vs squat pivot due to impaired seeuqncing and initiation of steps.   Plan   Plan Comments LE dressing, toileting, balance during ADLS functional cognition, fine motor coordination/grip strengthening     Beverly Gust, OTR/L

## 2019-02-26 NOTE — Progress Notes
Rehabilitation Medicine Progress Note     Patient: Mitchell Lucero   [id number]   Date of Service: 02/26/2019  Date of Admission: 02/23/2019  Length of Stay: 3 days    Principal Problem:    Cerebellar stroke, acute (HCC)  Active Problems:    Orthostatic hypotension    HTN (hypertension)    Tremors of nervous system    T2DM (type 2 diabetes mellitus) (HCC)    History of neck surgery    Left pontine stroke (HCC)    TIA (transient ischemic attack)    Gait abnormality    Right hemiparesis (HCC)    Cirrhosis (HCC)    Hepatocellular carcinoma (HCC)    Dysarthria    Thrombocytopenia (HCC)    Occlusion of superior cerebellar artery    Impaired gait    Postural instability of trunk    Ataxia    Impaired mobility and ADLs    Acute ischemic stroke [facility])      ASSESSMENT & PLAN     Adeolu Pannone Pesch is a 69 y.o. male who is currently admitted to inpatient rehabilitation for Cerebellar stroke, acute (HCC).      Rehabilitation Plan  Rehabilitation: Patient will continue with comprehensive therapies including physical therapy, occupational therapy, speech & language pathology (as applicable), specialized rehab nursing, neuropsychology and physiatry oversight.    Goals: Minimum assistance, household mobility, With caregiver assistance  Recommended therapy after discharge: Home with Assistance, Home Health Setting  Recommended equipment: Too early to be determined  Tentative discharge date: 03/10/19    Daily Functional Update:  Transfers        Transfer: Assistive Device: Roller Walker;None (02/25/2019  2:15 PM)    Transfer: Sit to stand assist level: Moderate assistance (02/25/2019  2:15 PM)    Transfer: Stand pivot assist level: Moderate assistance;With two person (02/25/2019  2:15 PM)    No data recorded   Gait/ Mobility        Gait: Assistive Device: Roller Walker (02/25/2019  2:15 PM)    GAIT: Assist Level: Moderate assistance;Two person assistance (CGA of 2nd person) (02/25/2019  2:15 PM) Gait Distance: 5 feet (+ 20 ft for TUG) (02/25/2019  2:15 PM)    Wheelchair: Distance: 170 feet (02/25/2019  2:15 PM)    Wheelchair: Assistance Level: Moderate assistance (02/25/2019  2:15 PM)     Toileting        Toileting Assist: Total Assist (per clinical judgement) (02/24/2019 10:00 AM)    Toileting Equipment: Grab bar - left (02/05/2019  1:40 PM)    Toilet Transfer Assist: WIth two person (02/25/2019  2:15 PM)     Dressing LE Dressing Assist: Total Assist (02/25/2019  7:15 AM)    UE Dressing Assist: Total Assist (02/25/2019  7:15 AM)            MEDICAL PROBLEMS:    Left pontine stroke (CVA), 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  -Goal long term BP from stroke perspective would be a little higher ideally around 110-140/70-90  ???             >???f/u OP with Mov Disorders neurology/requested   ?????????????????????             >???continue ASA, Plavix, Statin for secondary stroke prevention               >  BP management as below with some permissive hypertension to promote adequate perfusion  ???             >???consult OT, PT, SLP  ???  CARDIO:  Hx of HLD, HTN  Orthostatic Hypotension  -TTE normal EF, moderate LA dilation, no thrombus/shunt.  ???????????????????????????????????????>???continue rec Abd Binder and Ted Hose  ???????????????????????????????????????>???continue Midodrine, now 5mg  TID to maintain perfusion brain  ???????????????????????????????????????>???dc PTA flomax and Acei  ???????????????????????????????????????>???event monitor on d/c for further stroke risk factor assessment   ???????????????????????????????????????>???f/u cardio upon DC  ???  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  GI:  HCC, portal HTN/ascites, NASH  Hyperammonemia due to decompensated cirrhosis  -Abdominal US shows hepatic mass  -Abdominal CT shows mass c/w hepatocellular carcinoma, cirrhosis, portal HTN, small ascites  ???????????????????????????????????????>???f/u GI OP for biopsy after DC from rehab/ requested appt (hold Plavix for at least 5 days prior to biopsy)  ???????????????????????????????????????>???continue lactulose (3-4 BM daily)   ???  ENDO:  T2DM  ???????????????????????????????????????> PTA Levemir 10 units QHS and metformin 500 mg BID  ???????????????????????????????????????>???hold metformin, lantus 8 units QHS, LDCF  ???  Pain Management: pain is well controlled, heat / ice PRN, topical medication and Tylenol PRN  Skin: Encourage the patient to continue to inspect their skin and perform pressure relief.  Bowel: continent of bowel  Bladder: monitor bladder continence, BVIs x3 until <115ml and ISC for >31ml  Nutrition: Current diet:  diabetic  Mental Health: consult neuropsychology to provide support / counseling; continue citalopram  DVT ppx: enoxaparin (LOVENOX) syringe 40 mg  QDAY  Code Status: Full Code  DC Planning:   > will need to see hepatology, biopsy must reschedule appt. F/u neuro needed    02/26/2019: no changes  .     Burney Gauze, DO  02/26/2019    8:42 AM       SUBJECTIVE/ History:   Deenisapain, doing well regular BM    OBJECTIVE/ Physical Exam:     Vitals:    02/25/19 1458 02/25/19 1500 02/25/19 1703 02/26/19 0405   BP: 111/89  133/72 118/69   BP Source:   Arm, Right Upper Arm, Right Upper   Pulse: 89  79 76   Temp:   37.1 ???C (98.7 ???F) 36.6 ???C (97.9 ???F)   SpO2: 98%  97% 96%   Weight:  72.1 kg (158 lb 15.2 oz)     Height:  165.1 cm (65)       Vitals:    02/23/19 1500 02/25/19 1500   Weight: 72.1 kg (158 lb 15.2 oz) 72.1 kg (158 lb 15.2 oz)     General: Alert, NAD  HEENT: NCAT, hearing intact, no drainage  Heart: Extremities appear well perfused  Lungs: non labored breathing. Symmetrical chest expansion  Abdomen: non-distended  Extremities: no pedal edema  Neuro: Alert,. Appropriate processing time with conversation, limited awords.   Psych: limited interaction, 1 word response    RESULTS AND DATA REVIEWED:      Current Medication List: Scheduled Meds:aspirin EC tablet 81 mg, 81 mg, Oral, QAM8  atorvastatin (LIPITOR) tablet 80 mg, 80 mg, Oral, QHS  citalopram (CeleXA) tablet 20 mg, 20 mg, Oral, QAM8  clopiDOGrel (PLAVIX) tablet 75 mg, 75 mg, Oral, QDAY  docusate (COLACE) capsule 100 mg, 100 mg, Oral, BID  enoxaparin (LOVENOX) syringe 40 mg, 40 mg, Subcutaneous, QDAY  insulin aspart U-100 (NOVOLOG FLEXPEN) injection PEN 0-12  Units, 0-12 Units, Subcutaneous, ACHS (22)  insulin glargine (LANTUS SOLOSTAR) injection PEN 8 Units, 8 Units, Subcutaneous, QHS(22)  lactulose oral solution 20 g, 20 g, Oral, TID  latanoprost (XALATAN) 0.005 % ophthalmic solution 1 drop, 1 drop, Both Eyes, QHS  melatonin tablet 3 mg, 3 mg, Oral, QHS  midodrine (PROAMATINE) tablet 5 mg, 5 mg, Oral, TID  ondansetron (ZOFRAN) tablet 4 mg, 4 mg, Oral, ACHS  pantoprazole DR (PROTONIX) tablet 20 mg, 20 mg, Oral, QDAY  senna (SENOKOT) tablet 2 tablet, 2 tablet, Oral, QHS  sodium chloride PF 0.9% flush 3-5 mL, 3-5 mL, Flush, FLUSH TID    Continuous Infusions:  PRN and Respiratory Meds:acetaminophen Q4H PRN, aluminum/magnesium hydroxide Q4H PRN, bisacodyL QDAY PRN, milk of magnesia (CONC) Q4H PRN, traZODone QHS PRN    Last Bowel Movement Date: 02/25/19 (02/25/2019  3:53 PM)    Post-Void(PVR) Bladder Scan (mL): 50 milliliters (02/24/2019  5:30 PM)    Straight Cath (mL): 350 (02/26/2019  6:01 AM)      Results for orders placed or performed during the hospital encounter of 02/23/19 (from the past 24 hour(s))   CBC CELLULAR THERAPEUTICS    Collection Time: 02/25/19  9:10 AM   # # Low-High    White Blood Cells 5.6 4.5 - 11.0 K/UL    RBC 3.92 (L) 4.4 - 5.5 M/UL    Hemoglobin 11.9 (L) 13.5 - 16.5 GM/DL    Hematocrit 16.1 (L) 40 - 50 %    MCV 89.9 80 - 100 FL    MCH 30.4 26 - 34 PG    MCHC 33.8 32.0 - 36.0 G/DL    RDW 09.6 (H) 11 - 15 %    Platelet Count 142 (L) 150 - 400 K/UL    MPV 7.6 7 - 11 FL   BASIC METABOLIC PANEL CELLULAR THERAPEUTICS    Collection Time: 02/25/19  9:10 AM   # # Low-High Sodium 138 137 - 147 MMOL/L    Potassium 3.8 3.5 - 5.1 MMOL/L    Chloride 103 98 - 110 MMOL/L    CO2 28 21 - 30 MMOL/L    Anion Gap 7 3 - 12    Glucose 187 (H) 70 - 100 MG/DL    Blood Urea Nitrogen 13 7 - 25 MG/DL    Creatinine 0.45 0.4 - 1.24 MG/DL    Calcium 8.5 8.5 - 40.9 MG/DL    eGFR Non African American >60 >60 mL/min    eGFR African American >60 >60 mL/min   POC GLUCOSE    Collection Time: 02/25/19 12:09 PM   # # Low-High    Glucose, POC 182 (H) 70 - 100 MG/DL   POC GLUCOSE    Collection Time: 02/25/19  5:24 PM   # # Low-High    Glucose, POC 203 (H) 70 - 100 MG/DL   POC GLUCOSE    Collection Time: 02/25/19  9:55 PM   # # Low-High    Glucose, POC 161 (H) 70 - 100 MG/DL   POC GLUCOSE    Collection Time: 02/26/19  7:25 AM   # # Low-High    Glucose, POC 90 70 - 100 MG/DL         Burney Gauze, DO    Voalte Me  02/26/2019   8:42 AM

## 2019-02-26 NOTE — Progress Notes
PHYSICAL THERAPY     02/26/19 1430   Subjective   Subjective Patients spouse is present for second session.    Transfers   Transfer: Database administrator  [employer])   Transfer: Sit to stand assist level Minimum assistance;With two person  (One person with rail)   Transfer: Sit to stand type of assist For safety;Increased time to complete;Requires cues for positioning of hands  (For safety;Increased time to complete;Requires cues for posi)   Transfer: Stand to sit assist level Minimum assistance   Transfer: Stand to sit type of assist For safety;Increased time to complete;Requires cues for positioning of hands   Transfer: Stand pivot assist level Maximum assistance   Transfer: Stand pivot type of assist For safety;Facilitation of weight shift;Requires cues for positioning of hands;Requires cues for positioning of feet;Requires verbal cues for sequencing   Gait   Gait Distance 10 feet  (3x)   GAIT: Assist Level Moderate assistance   GAIT: Type of Assist For safety;Requires cues for step length;Facilitation of trunk;Facilitation of weight shift;Progression of right lower extremity   Gait: Assistive Device Rail   Gait: Patterns Ataxic;Decreased gait velocity;Trunk lean forward;Variable step length;Scissoring right lower extremity   Gait: Deviation Left Decreased stride length   Gait: Deviation Right Decreased stride length;Knee hyperextension/genu recurvatum   Activity Limited By Complaint of dizziness;Complaint of fatigue   Gait Comment Facilitation at RLE to inhibit adduction in swing.  Patient is able to progress RLE without assist on last bout of gait.   Assessment   Assessment Patient's session focused on gait training with rail only.  Patient requires assist to inhibit RLE adduction with verbal cues and physical assist.  On third bout patient was advancing RLE on own with the correct foot placement,   Plan   Comments Gait with compression (SBP goal >120), monitor vitals for gait (usually orthostatic),  transfer safety, HIGT & variable gait as patient tolerates.      Pam Drown DPT, PT

## 2019-02-26 NOTE — Progress Notes
PHYSICAL THERAPY     02/26/19 1300   Precautions   Precautions   (compression on for mobility at all times, SBP goal >120 )   Subjective   Subjective Patient reports dizziness with gait.  BP taken during breaks.  See gait section   Transfers   Transfer: Assistive Device Hand Hold Assist   Transfer: Sit to stand assist level Minimum assistance;With two person   Transfer: Sit to stand type of assist For safety;Increased time to complete;Requires cues for positioning of hands;Requires verbal cues for sequencing;Facilitation of trunk   Transfer: Stand to sit assist level Minimum assistance   Transfer: Stand to sit type of assist For safety;Increased time to complete;Requires cues for positioning of hands   Transfer: Stand pivot assist level Maximum assistance   Transfer: Stand pivot type of assist For safety;Facilitation of weight shift;Requires cues for positioning of hands;Requires cues for positioning of feet;Requires verbal cues for sequencing   Gait   Gait Distance 20 feet  (10, 10)   GAIT: Assist Level Moderate assistance;Minimum assistance   GAIT: Type of Assist For safety;Facilitation of trunk;Requires cues for step length;Facilitation of weight shift   Gait: Hospital doctor;Shopping Cart;Hand Hold Assist   Gait: Patterns Ataxic;Decreased gait velocity;Trunk lean forward   Activity Limited By Complaint of dizziness   Gait Comment Moderate assist on left side, min assist on left side when assist with HHA.  Patient trialed ambulation with shopping to facilitate forced use of RLE.  Patient reported dizziness between bouts with the shopping cart.  BP148/80 dropped to 134/78.  Patient with binder and lower extremity compression.  Trialed 5 feet with the roller walke.  Patient was unable to safely control the RW.     Assessment   Assessment Patient was limited by dizziness and drop in blood pressure.  Patient preformed best with hand hold assist x 2.  Patient was unable to keep the roller walker in a safe position.  Patient with improved upright posture with hand hold assist   Plan   Comments Gait with compression (SBP goal >120), monitor vitals for gait (usually orthostatic),  transfer safety, HIGT & variable gait as patient tolerates.      Pam Drown DPT, PT

## 2019-02-27 LAB — POC GLUCOSE
Lab: 172 mg/dL — ABNORMAL HIGH (ref 70–100)
Lab: 74 mg/dL (ref 70–100)
Lab: 118 mg/dL — ABNORMAL HIGH (ref 70–100)
Lab: 144 mg/dL — ABNORMAL HIGH (ref 70–100)

## 2019-02-27 LAB — CBC CELLULAR THERAPEUTICS: Lab: 5.6 K/UL — ABNORMAL HIGH (ref >60)

## 2019-02-27 LAB — BASIC METABOLIC PANEL CELLULAR THERAPEUTICS: Lab: 138 MMOL/L — ABNORMAL LOW (ref >60)

## 2019-02-27 MED ORDER — ONDANSETRON HCL (PF) 4 MG/2 ML IJ SOLN
4 mg | Freq: Once | INTRAVENOUS | 0 refills | Status: CP
Start: 2019-02-27 — End: ?
  Administered 2019-02-27: 18:00:00 4 mg via INTRAVENOUS

## 2019-02-27 MED ORDER — ONDANSETRON HCL (PF) 4 MG/2 ML IJ SOLN
4 mg | INTRAVENOUS | 0 refills | Status: DC | PRN
Start: 2019-02-27 — End: 2019-02-28
  Administered 2019-02-28 (×2): 4 mg via INTRAVENOUS

## 2019-02-27 NOTE — Progress Notes
Rehabilitation Medicine Progress Note     Patient: Mitchell Lucero   [id number]   Date of Service: 02/27/2019  Date of Admission: 02/23/2019  Length of Stay: 4 days    Principal Problem:    Cerebellar stroke, acute (HCC)  Active Problems:    Orthostatic hypotension    HTN (hypertension)    Tremors of nervous system    T2DM (type 2 diabetes mellitus) (HCC)    History of neck surgery    Left pontine stroke (HCC)    TIA (transient ischemic attack)    Gait abnormality    Right hemiparesis (HCC)    Cirrhosis (HCC)    Hepatocellular carcinoma (HCC)    Dysarthria    Thrombocytopenia (HCC)    Occlusion of superior cerebellar artery    Impaired gait    Postural instability of trunk    Ataxia    Impaired mobility and ADLs    Acute ischemic stroke [facility])      ASSESSMENT & PLAN     Mitchell Lucero is a 68 y.o. male who is currently admitted to inpatient rehabilitation for Cerebellar stroke, acute (HCC).      Rehabilitation Plan  Rehabilitation: Patient will continue with comprehensive therapies including physical therapy, occupational therapy, speech & language pathology (as applicable), specialized rehab nursing, neuropsychology and physiatry oversight.    Goals: Minimum assistance, household mobility, With caregiver assistance  Recommended therapy after discharge: Home with Assistance, Home Health Setting  Recommended equipment: Too early to be determined  Tentative discharge date: 03/10/19    Daily Functional Update:  Transfers        Transfer: Assistive Device: Chief of Staff Assist (Rail) (02/26/2019  2:30 PM)    Transfer: Sit to stand assist level: Minimum assistance;With two person (One person with rail) (02/26/2019  2:30 PM)    Transfer: Stand pivot assist level: Maximum assistance (02/26/2019  2:30 PM)    No data recorded   Gait/ Mobility        Gait: Assistive Device: Rail (02/26/2019  2:30 PM)    GAIT: Assist Level: Moderate assistance (02/26/2019  2:30 PM)    Gait Distance: 10 feet (3x) (02/26/2019  2:30 PM) Wheelchair: Distance: 170 feet (02/25/2019  2:15 PM)    Wheelchair: Assistance Level: Moderate assistance (02/25/2019  2:15 PM)     Toileting        Toileting Assist: Maximum Assist (02/26/2019  2:00 PM)    Toileting Equipment: Grab bar - right (02/26/2019  2:00 PM)    Toilet Transfer Assist: For safety;Maximum assistance (02/26/2019  2:00 PM)     Dressing LE Dressing Assist: Total Assist (02/25/2019  7:15 AM)    UE Dressing Assist: Total Assist (02/25/2019  7:15 AM)            MEDICAL PROBLEMS:    Left pontine stroke (CVA), 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  -Goal long term BP from stroke perspective would be a little higher ideally around 110-140/70-90  ???             >???f/u OP with Mov Disorders neurology/requested   ?????????????????????             >???continue ASA, Plavix, Statin for secondary stroke prevention               >  BP management as below with some permissive hypertension to promote adequate perfusion  ???             >???consult OT, PT, SLP  ???  CARDIO:  Hx of HLD, HTN  Orthostatic Hypotension  -TTE normal EF, moderate LA dilation, no thrombus/shunt.  ???????????????????????????????????????>???continue rec Abd Binder and Ted Hose  ???????????????????????????????????????>???continue Midodrine, now 5mg  TID to maintain perfusion brain  ???????????????????????????????????????>???dc PTA flomax and Acei  ???????????????????????????????????????>???event monitor on d/c for further stroke risk factor assessment   ???????????????????????????????????????>???f/u cardio upon DC  ???  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  GI:  HCC, portal HTN/ascites, NASH  Hyperammonemia due to decompensated cirrhosis  -Abdominal US shows hepatic mass  -Abdominal CT shows mass c/w hepatocellular carcinoma, cirrhosis, portal HTN, small ascites ???????????????????????????????????????>???f/u GI OP for biopsy after DC from rehab/ requested appt (hold Plavix for at least 5 days prior to biopsy)  ???????????????????????????????????????>???continue lactulose (3-4 BM daily)   ???  ENDO:  T2DM  ???????????????????????????????????????> PTA Levemir 10 units QHS and metformin 500 mg BID  ???????????????????????????????????????>???hold metformin, lantus 8 units QHS, LDCF  ???  Pain Management: pain is well controlled, heat / ice PRN, topical medication and Tylenol PRN  Skin: Encourage the patient to continue to inspect their skin and perform pressure relief.  Bowel: continent of bowel  Bladder: monitor bladder continence, BVIs x3 until <120ml and ISC for >378ml  Nutrition: Current diet:  diabetic  Mental Health: consult neuropsychology to provide support / counseling; continue citalopram  DVT ppx: enoxaparin (LOVENOX) syringe 40 mg  QDAY  Code Status: Full Code  DC Planning:   > will need to see hepatology, biopsy must reschedule appt. F/u neuro needed    02/27/2019: started home eye drops.     Burney Gauze, DO  02/27/2019    8:41 AM       SUBJECTIVE/ History:   Denies  Fevers chills repost cough and hiccups intermittently    OBJECTIVE/ Physical Exam:     Vitals:    02/25/19 1703 02/26/19 0405 02/26/19 1645 02/27/19 0603   BP: 133/72 118/69 115/69 133/71   BP Source: Arm, Right Upper Arm, Right Upper Arm, Right Upper Arm, Right Upper   Pulse: 79 76 86 80   Temp: 37.1 ???C (98.7 ???F) 36.6 ???C (97.9 ???F) 36.6 ???C (97.8 ???F) 36.6 ???C (97.8 ???F)   SpO2: 97% 96% 95% 95%   Weight:       Height:         Vitals:    02/23/19 1500 02/25/19 1500   Weight: 72.1 kg (158 lb 15.2 oz) 72.1 kg (158 lb 15.2 oz)     General: Alert, NAD answers questions  Appear healthy  HEENT: NCAT, hearing intact, + glasses  Heart: Extremities appear well perfused  Lungs: non labored breathing. Symmetrical chest expansion  Abdomen: non-distended  Extremities: no pedal edema  Neuro: Alert,. Appropriate processing time with conversation, limited awords. No tremor  Psych: limited interaction, 1 word response RESULTS AND DATA REVIEWED:      Current Medication List:  Scheduled Meds:aspirin EC tablet 81 mg, 81 mg, Oral, QAM8  atorvastatin (LIPITOR) tablet 80 mg, 80 mg, Oral, QHS  Bimatoprost (Lumigan) 0.01 % drop 1 drop ++patient own med++, 1 drop, Both Eyes, QDAY(21)  citalopram (CeleXA) tablet 20 mg, 20 mg, Oral, QAM8  clopiDOGrel (PLAVIX) tablet 75 mg, 75 mg, Oral, QDAY  docusate (COLACE)  capsule 100 mg, 100 mg, Oral, BID  enoxaparin (LOVENOX) syringe 40 mg, 40 mg, Subcutaneous, QDAY  insulin aspart U-100 (NOVOLOG FLEXPEN) injection PEN 0-12 Units, 0-12 Units, Subcutaneous, ACHS (22)  insulin glargine (LANTUS SOLOSTAR) injection PEN 8 Units, 8 Units, Subcutaneous, QHS(22)  lactulose oral solution 20 g, 20 g, Oral, TID  melatonin tablet 3 mg, 3 mg, Oral, QHS  midodrine (PROAMATINE) tablet 5 mg, 5 mg, Oral, TID  ondansetron (ZOFRAN) tablet 4 mg, 4 mg, Oral, ACHS  pantoprazole DR (PROTONIX) tablet 20 mg, 20 mg, Oral, QDAY  senna (SENOKOT) tablet 2 tablet, 2 tablet, Oral, QHS  sodium chloride PF 0.9% flush 3-5 mL, 3-5 mL, Flush, FLUSH TID    Continuous Infusions:  PRN and Respiratory Meds:acetaminophen Q4H PRN, aluminum/magnesium hydroxide Q4H PRN, bisacodyL QDAY PRN, milk of magnesia (CONC) Q4H PRN, traZODone QHS PRN    Last Bowel Movement Date: 02/25/19 (02/26/2019  9:30 PM)    Post-Void(PVR) Bladder Scan (mL): 50 milliliters (02/24/2019  5:30 PM)    Straight Cath (mL): 350 (02/26/2019  6:01 AM)      Results for orders placed or performed during the hospital encounter of 02/23/19 (from the past 24 hour(s))   CBC CELLULAR THERAPEUTICS    Collection Time: 02/26/19  8:50 AM   # # Low-High    White Blood Cells 5.3 4.5 - 11.0 K/UL    RBC 4.08 (L) 4.4 - 5.5 M/UL    Hemoglobin 12.4 (L) 13.5 - 16.5 GM/DL    Hematocrit 96.0 (L) 40 - 50 %    MCV 90.1 80 - 100 FL    MCH 30.3 26 - 34 PG    MCHC 33.6 32.0 - 36.0 G/DL    RDW 45.4 (H) 11 - 15 %    Platelet Count 139 (L) 150 - 400 K/UL    MPV 7.5 7 - 11 FL BASIC METABOLIC PANEL CELLULAR THERAPEUTICS    Collection Time: 02/26/19  8:50 AM   # # Low-High    Sodium 139 137 - 147 MMOL/L    Potassium 3.9 3.5 - 5.1 MMOL/L    Chloride 105 98 - 110 MMOL/L    CO2 27 21 - 30 MMOL/L    Anion Gap 7 3 - 12    Glucose 95 70 - 100 MG/DL    Blood Urea Nitrogen 11 7 - 25 MG/DL    Creatinine 0.98 0.4 - 1.24 MG/DL    Calcium 8.3 (L) 8.5 - 10.6 MG/DL    eGFR Non African American >60 >60 mL/min    eGFR African American >60 >60 mL/min   POC GLUCOSE    Collection Time: 02/26/19 11:57 AM   # # Low-High    Glucose, POC 144 (H) 70 - 100 MG/DL   POC GLUCOSE    Collection Time: 02/26/19  4:37 PM   # # Low-High    Glucose, POC 154 (H) 70 - 100 MG/DL   POC GLUCOSE    Collection Time: 02/26/19 10:21 PM   # # Low-High    Glucose, POC 172 (H) 70 - 100 MG/DL   POC GLUCOSE    Collection Time: 02/27/19  7:34 AM   # # Low-High    Glucose, POC 74 70 - 100 MG/DL         Burney Gauze, DO    [employer]  02/27/2019   8:41 AM

## 2019-02-27 NOTE — Progress Notes
Pt took meds at 1045, then drank lactulose in grape juice and promtly vomited about 200 ml undigested breakfast and possibly his morning meds.  Pt had no complaints after vomiting.  Dr. Rona Ravens notified

## 2019-02-27 NOTE — Consults
ACTIVITY THERAPY   Initial Assessment of Interests  Length of session: 0 minutes    Name: Mitchell Lucero ;   DOB: October 30, 1950; 68 y.o.; male   Problems List: Present on Admission:   Orthostatic hypotension   HTN (hypertension)   Tremors of nervous system   T2DM (type 2 diabetes mellitus) ([place])   History of neck surgery   Left pontine stroke ([place])   TIA (transient ischemic attack)   Gait abnormality   Right hemiparesis (HCC)   Cirrhosis ([place])   Hepatocellular carcinoma ([place])   Dysarthria   Thrombocytopenia ([place])   Occlusion of superior cerebellar artery   Cerebellar stroke, acute ([place])   Acute ischemic stroke ([place])      Pt familiar to AT services. Pt plays some games with g.grandaughter and will sometimes engage with AT via these games.     Pt not feeling well this date, per wife, and preferring to rest.       Pauline Aus, Activity Therapist, MA MT-BC  Board Certified - Music Therapist  [employer] @ Pauline Aus

## 2019-02-27 NOTE — Progress Notes
Pt given oral zofran, midorine and ASA at 5pm.  After about 25 minutes pt want's to get up to the commode to have a BM, but when we tried to sit him up he became very nauseated and vomited up some mucus.  Pt had not eaten since 7am.  Pt is more oriented today, but is very sleepy.  Wife at bedside.  Dr. Rona Ravens notified.

## 2019-02-27 NOTE — Progress Notes
Pharmacy Note: Patients Own Med    The following medications have been identified by pharmacy:  Medication: bimatoprost (Lumigan) 0.01%  Expiration date: 06/2020  [facility]arm[facility]: [facility]    The patient may use their own supply of these medications during this admission.  All doses should be administered and documented per hospital policy.

## 2019-02-28 ENCOUNTER — Encounter: Admit: 2019-02-28 | Discharge: 2019-02-28 | Payer: MEDICARE

## 2019-02-28 LAB — POC GLUCOSE
Lab: 94 mg/dL (ref 70–100)
Lab: 154 mg/dL — ABNORMAL HIGH (ref 70–100)
Lab: 172 mg/dL — ABNORMAL HIGH (ref 70–100)

## 2019-02-28 MED ORDER — ONDANSETRON 4 MG PO TBDI
4 mg | Freq: Before meals | ORAL | 0 refills | Status: DC | PRN
Start: 2019-02-28 — End: 2019-02-28

## 2019-02-28 MED ORDER — LACTULOSE 10 GRAM/15 ML PO SOLN WRAPPER
20 g | Freq: Three times a day (TID) | ORAL | 0 refills | Status: DC
Start: 2019-02-28 — End: 2019-03-09
  Administered 2019-02-28 – 2019-03-09 (×27): 20 g via ORAL

## 2019-02-28 MED ORDER — ONDANSETRON 4 MG PO TBDI
4 mg | Freq: Before meals | ORAL | 0 refills | Status: DC
Start: 2019-02-28 — End: 2019-03-09
  Administered 2019-02-28 – 2019-03-09 (×37): 4 mg via ORAL

## 2019-02-28 MED ORDER — SODIUM CHLORIDE 0.9 % IV SOLP
1000 mL | Freq: Once | INTRAVENOUS | 0 refills | Status: CP
Start: 2019-02-28 — End: ?
  Administered 2019-03-01: 02:00:00 1000 mL via INTRAVENOUS

## 2019-02-28 NOTE — Progress Notes
OCCUPATIONAL THERAPY       02/28/19 0830   Subjective   Subjective Pt agreeable to OT session.   Upper Body Dressing   UE Dressing Assist Moderate Assist   Upper Dressing Position Sitting in chair   Upper Dressing Garments/Equipment Abdominal binder   Upper Dressing Comments assist to orient shirt and cues for anterior weightshift in sitting, able to thread BLE and pull over head with assist to pull down in back   Lower Body Dressing   LE Dressing Assist Total Assist   Lower Dressing Position Sitting in chair   Lower Dressing Garments/Equipment Compression stockings   Lower Dressing Comments shorts, socks, and shoes. Therapist assist with positioning LE into figure four position, pt attempted to thread BLE into shorts but unable to perform anterior weightshift d/t retropuslion. Requires assist for all aspects. Maximal assist to stand and total assist to pull pants over hips   Bed Mobility   Bed Mobility: Supine to sit assist level Maximum assistance   Bed Mobility: Supine to sit type of assist Assist with trunk   Transfers   Transfer: Assistive Device None   Transfer: Sit to stand assist level Maximum assistance   Transfer: Sit to stand type of assist Facilitation of hip extension;Facilitation of trunk;Facilitation of weight shift   Transfer: Stand to sit assist level Moderate assistance   Transfer: Stand to sit type of assist Facilitation of weight shift;Requires cues for positioning of hands   Transfer: Stand pivot assist level Maximum assistance   Transfer: Stand pivot type of assist Facilitation of hip extension;Facilitation of trunk;Facilitation of weight shift;For safety   Transfer Comment Patient performed stand pivot transfers x4. Pt very retropulsive and rigid upon stand, requiring maximal assist to stand and max assist for weightshift.    Activity   Therapeutic Activities ADLs performed as described above.   Assessment   Assessment Patient continues to be limited by significant retropulsion and requiring high burden of care for all self cares.   Plan   Plan Comments LE dressing, toileting, functional cognition, dynamic sitting balance   Recommendations   OT Discharge Recommendations Home with family assist;Home with Home Health   OT Equipment Recommendations Patient owns necessary equipment   Weekly Goals   Patient Will Perform Bathing w/ Moderate Assist   Patient Will Perform UE Dressing w/ Moderate Assist   Patient Will Perform LE Dressing w/ Moderate Assist   Patient Will Perform All ADL's w/ Stand By Assist   Patient Will Perform Grooming w/ Stand By Assist;in Chair   Patient Will Perform Toileting w/ Moderate Assist   Pt Will Perform All Functional Transfers Moderate Assist   Pt Will Transfer To Shower w/ Moderate Assist   Goals for the stay   Pt will perform basic care and transfer with Minimum assistance  (least restrictive device, assistance with IADLs)          Nicola Girt, OTR/L

## 2019-02-28 NOTE — Telephone Encounter
Patient discharged from [facility] recently after second stroke, now back in inpatient rehab. He is due to be discharged on 8/27, follow up appointment scheduled with hepatology on 8/28. Discussed with [place], nurse navigator for Dr. Arma Heading, re: coordinating follow up appointments. She was able to schedule him with Dr. Arma Heading at 10:40 am on 8/28. Patient currently scheduled with hepatology at 1:00pm. Plan will be to have the patient see PAT following his hepatology appointment, then have a consult with Dr. Mauricia Area, who has clinic on Friday afternoons.    Call placed to patient's wife to discuss. She states that the patient is due for assessment tomorrow and believes he will be in rehab longer than was previously expected due to deficits. Discussed that I would reach out the [place] to discuss this and will continue to monitor his status in rehab.    Dorris Carnes, RN BSN

## 2019-02-28 NOTE — Progress Notes
SPEECH-LANGUAGE PATHOLOGY     02/28/19 1400   Behavior   Comments* Patient reported he was nauseated and not feeling well. His speech appeared to be more slurred than usual. RN cam to check his vitals whicher were found to be WFL's. Speech observed to clear over the course of the session.   Orientation Goals   Therapy Activities Orientation log score   Orientation Log Score (Out of 30) 29/30   Problem Solving Goals   Therapy Activities Problem solving/reasoning   Problem Solving/Reasoning Visual;Standby prompting - patient able to > 90% of the time, prompting < 10%   Therapy Activity Comments Identifying the problems: 86% accy min cues.  Worked on identification of a problem and probable solutions.  Moderate cues to identify solutions.     Patient Will Provide Appropriate Solutions to Problems of Daily Living with Progressing   Patient Will Be Able to Problem Solve Safety and Awareness for Daily Living Skills with Progressing   Verbal Expression Goals   Therapy Activities Confrontation naming   Confrontation Naming Minimal prompting -75-90% of the time   Therapy Activity Comments Noted word retrieval issues. Confrontation naming: 27/30 90% accy  Patient reported he is having difficulty finding words.  Noted omission of some initial conconsant sounds, impacting speech intelligibility.     Short Term Goals Patient will express themselves during a 5-10 minute conversation with   Patient Will Express Themselves During a 5-10 Minute Conversation with Progressing   Patient Will Express Basic Wants and Needs for Daily Living Skills with [place] M.A.L/CCC/SLP

## 2019-02-28 NOTE — Progress Notes
Pt had a large liquid to loose bm in bed and was incontinent of urine.  Dr. Rona Ravens notified and said that it was ok to hold laxatives tonight.  Order for IV zofran obtained.  Wife is spending the night and would like to speak with the doctors in the morning.  She is concerned about further cerebellar stroke strategies, as well as wanting to know what to do about the Liver Biopsy and cancer work up coming up in the next week.

## 2019-02-28 NOTE — Progress Notes
PHYSICAL THERAPY     02/28/19 1030   Pain   Pain Scale No Pain   Subjective   Subjective Patient presents in bedside chair leaning left. R heel noted to be out of shoe/AFO.    Transfers   Transfer: Assistive Device None   Transfer: Sit to stand assist level Moderate assistance;With two person   Transfer: Sit to stand type of assist For safety;Requires cues for positioning of feet;Requires cues for positioning of hands;Requires verbal cues for sequencing;Facilitation of hip extension   Transfer: Stand to sit assist level Moderate assistance;With two person   Transfer: Stand pivot assist level Maximum assistance;With two person   Transfer: Stand pivot type of assist For safety;Facilitation of weight shift;Facilitation of trunk   Transfer Comment Pt is noted to be retropulsive in standing unless RLE is directly under his BOS. Pt performed stand pivot to wheelchair angled towards him.    Toileting   Toileting-Adjusting clothing BEFORE using toiet, commode, bedpan or urinal Assist Yes   Toileting-Wipe Self Assist Yes   Toileting-Adjust clothing AFTER using toilet, commode, bedpan or urinal Assist Yes   Toileting Assist Total Assist   Toileting Position Seated   Toileting Equipment Grab bar - left   Toileting Comments Assist x2 for toileting, pt missed the toilet, voiding onto the floor in sitting & required total assist for brief/pants change & hygiene.    Toileting Oceanographer Technique Stand pivot to right   Toilet Transfer Assist Moderate assistance;WIth two person   Acupuncturist Grab bar - left   Daily Care   Patient continent of bladder? No   Urine Occurrence 1   Accident - Bladder Large   Urine Output Source Diaper/Brief  (& floor, pants)   Urine Color Unable to assess or not observed   Urine Description Unable to assess or not observed   Patient continent of bowel? Yes, No Bowel Program   Stool Occurrence 1   Stool Amount Small   Stool Appearance Unable to assess Stool Color Unable to assess   Gait   Gait Distance 10 feet   GAIT: Assist Level Moderate assistance  (2nd person for wheelchair follow)   GAIT: Type of Assist Requires cues for step length;For safety   GaitAgricultural consultant;Wheelchair Follow;AFO: Ankle Foot Orthosis   Gait: Patterns Narrow base of support;Ataxic   Gait: Deviation Left Decreased stride length   Gait: Deviation Right Decreased stride length;Knee hyperextension/genu recurvatum   Gait Comment Pt is currently hyperextending the RLE in the AFO; the former AFO is noted to not be as effective.    Stairs   Stair: Assist Level Not safe to attempt   Assessment   Assessment After gait training, the patient began vomitting. Pt endorses that movement hsas been inducing vomitting. The nurse was notified.    Plan   Comments Mobility wiht compression (SBP goal >120), safety with transfers, bed mobility, wheelchair mobility, trial Nustep, gait at rail as pt tolerates.    Recommendations   PT Discharge Recommendations Home with Assistance;Home Health Setting   Equipment Recommendations Wheelchair - Manual;Wheelchair cushion  (& sit to stand lift)   Expected Discharge Date 03/10/19   Weekly Goals   Patient will perform sit to supine with Minimum assistance;Progressing   Patient will perform supine to sit with Minimum assistance;Progressing   Patient will complete sit to stand transfer with Moderate assistance;Progressing   Patient will complete stand to sit transfer with Moderate assistance;Progressing   Patient will complete  stand pivot transfer with Moderate assistance;Progressing   Patient will ambulate 50 feet;Moderate assistance;Least assistive device;Progressing   Patient will ascend/descend   (discontinued)   Weekly Wheelchair Goals Patient will propel manual wheelchair on level surfaces with   Patient will propel manual wheelchair on level surfaces with Stand by assistance; progressing   Goal(s) for the Stay Patient will perform Moderate assistance;household mobility;at wheelchair level;With caregiver assistance     Doretha Sou, PT, DPT

## 2019-02-28 NOTE — Progress Notes
OCCUPATIONAL THERAPY       02/28/19 1300   Subjective   Subjective Pt agreeable to OT upon arrival.   Eating   Eating - Pick Up Utensil Assist No   Eating-[place] with Fork Assist Yes   Eating-Pick up Finger Foods Assist No   Eating - Scoop Assist Yes   Eating - Bring to Mouth Assist No   Eating - Chew and Swallow Assist No   Eating - Drink from Cup Assist No   Eating Assist Minimal Assist   Eating Position Sitting edge of bed   Eating Comments Pt with food in front of him upon arrival but not initiating eating, intermittent verbal cues for initiation. Pt using right hand to eat using built up utensils. Pt having difficuly maintaining grasp of fork with enough force to scoop and pierce food. Able to bring cup to mouth with right hand and stabilization of left   Transfers   Transfer: Assistive Device None   Transfer: Sit to stand assist level Maximum assistance   Transfer: Sit to stand type of assist Facilitation of hip extension;Facilitation of trunk;Facilitation of weight shift   Transfer: Stand to sit assist level Moderate assistance   Transfer: Stand to sit type of assist Facilitation of weight shift;For safety   Transfer: Stand pivot assist level Maximum assistance   Transfer: Stand pivot type of assist Facilitation of trunk;Facilitation of weight shift;For safety   Transfer Comment Sit>stands from recliner with maximal assist due to retropulsion.    Assessment   Assessment Patient continues to be limited by significant retropulsion and requiring high burden of care for all self cares.   Plan   Plan Comments LE dressing, toileting, functional cognition, dynamic sitting balance, BUE coordination/strengthening          Minus Liberty, OTR/L

## 2019-02-28 NOTE — Progress Notes
SPEECH-LANGUAGE PATHOLOGY     02/28/19 0800   Orientation Goals   Therapy Activities Orientation log score   Orientation Log Score (Out of 30) 29/30   Memory Goals   Therapy Activities Education   Therapy Activity Comments This service will re-instate a memory book. Patient slow to respond with reduced recall of recent events.  Patient exhibited moderate difficulty reading and understanding his therapy schedule until additional visual information was provided.  Goals were reviewed: functional memory for ADL's using visual reminders, simple problem solving and sequencing for ADL's. Mrs. Hynson was beside.  Both were in agreement with therapy plan.     Problem Solving Goals   Therapy Activities Sequencing   Sequencing Moderate prompting - patient able to 25-49% of the time   Therapy Activity Comments Verbal sequencing of UE and LE dressing. Patient slow to respond. He required moderate cues to provide step by step sequences to put on a shirt and his pants.  Continued with the CADL.  Visual problem solving: 8/12   Patient Will Provide Appropriate Solutions to Problems of Daily Living with Progressing   Patient Will Be Able to Problem Solve Safety and Awareness for Daily Living Skills with Progressing    Arna Snipe.A.L/CCC/SLP

## 2019-03-01 LAB — URINALYSIS MICROSCOPIC REFLEX TO CULTURE

## 2019-03-01 LAB — URINALYSIS DIPSTICK REFLEX TO CULTURE
Lab: NEGATIVE
Lab: NEGATIVE
Lab: NEGATIVE
Lab: NEGATIVE
Lab: NEGATIVE
Lab: NEGATIVE

## 2019-03-01 LAB — POC GLUCOSE
Lab: 189 mg/dL — ABNORMAL HIGH (ref 70–100)
Lab: 201 mg/dL — ABNORMAL HIGH (ref 70–100)
Lab: 202 mg/dL — ABNORMAL HIGH (ref 70–100)

## 2019-03-01 MED ORDER — INSULIN GLARGINE 100 UNIT/ML (3 ML) SC INJ PEN
5 [IU] | Freq: Every evening | SUBCUTANEOUS | 0 refills | Status: DC
Start: 2019-03-01 — End: 2019-03-09

## 2019-03-01 MED ORDER — BACLOFEN 10 MG PO TAB
2.5 mg | Freq: Three times a day (TID) | ORAL | 0 refills | Status: DC
Start: 2019-03-01 — End: 2019-03-09
  Administered 2019-03-01 – 2019-03-09 (×24): 2.5 mg via ORAL

## 2019-03-01 MED ORDER — NS WITH POTASSIUM CHLORIDE 30 MEQ/L IV INFUSION
INTRAVENOUS | 0 refills | Status: CP
  Administered 2019-03-01 (×2): 1000.000 mL via INTRAVENOUS

## 2019-03-01 MED ORDER — MAGNESIUM CITRATE PO SOLN
296 mL | Freq: Every day | ORAL | 0 refills | Status: DC | PRN
Start: 2019-03-01 — End: 2019-03-09
  Administered 2019-03-03 – 2019-03-08 (×3): 296 mL via ORAL

## 2019-03-01 NOTE — Progress Notes
SPEECH-LANGUAGE PATHOLOGY     03/01/19 1400   Orientation Goals   Therapy Activities Orientation log score   Orientation Log Score (Out of 30) 29/30   Therapy Activity Comments Patient independently referred to the environment for cues.   Patient Will Perform on the Orientation Log with Progressing   Patient Will be Oriented (to Person, Place, Time and Situation) to Improve Safety and Awareness for Daily Living with Progressing   Problem Solving Goals   Therapy Activities Problem solving/reasoning   Problem Solving/Reasoning Visual;High level;Minimal prompting - patient able to 75-90% of the time   Therapy Activity Comments Problem identification: 53% accy min cues.  Category exclusion - concrete level: 90% accy min cues.    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  Patient using short simple utterances often with impaired articulation.  Patient's speech often telegraphic. Patient with fair to fair+ insight into his problems with communication.   Patient Will Express Themselves During a 5-10 Minute Conversation with Progressing   Patient Will Express Basic Wants and Needs for Daily Living Skills with [place] M.A.L/CCC/SLP

## 2019-03-01 NOTE — Progress Notes
SPEECH-LANGUAGE PATHOLOGY     03/01/19 0900   Orientation Goals   Therapy Activities Orientation log score   Orientation Log Score (Out of 30) 29/30   Therapy Activity Comments Patient will refer to environmental cues.   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  Addressed word retrieval during this session. Opposite Meanings:  66% accy concrete level.  Convergent Naming: 100% accy (concrete level).  Divergent Naming:  60% accy (concrete level).  Provided feedback from session. Observed patient leaving off initial and/or final sounds of words.  Patient was able to correct given verbal cues >50% of the time.   Patient Will Express Themselves During a 5-10 Minute Conversation with Progressing   Patient Will Express Basic Wants and Needs for Daily Living Skills with [place] M.A.L/CCC/SLP

## 2019-03-01 NOTE — Case Management (ED)
Case Management Progress Note    NAME:Mitchell Lucero                          MRN: [id number]              DOB:04-27-1951          AGE: 68 y.o.  ADMISSION DATE: 02/23/2019             DAYS ADMITTED: LOS: 6 days      Today???s Date: 03/01/2019    Plan  Anticipated discharge to home with consistent supervision and physical assistance on 8/27 versus SNF.    Interventions  ? Support   Support: Pt/Family Updates re:POC or DC Plan   Social worker both with patient's wife Mitchell Lucero at [phone] to provide team conference updates.  Mitchell Lucero verbalized understanding that patient's level of care needs are significantly different than they were upon last admission to [place] rehab.    Social worker discussed need for physical assistance at home and consistent support.  Social worker and Mitchell Lucero discussed options and brainstormed ways to provide care to patient at home.  Mitchell Lucero is unsure if she can provide this assistance.  Social worker discussed FMLA.  Mitchell Lucero stays the night every night and then goes to work, however will stay all day on Thursday 8/22 work with staff to determine if she can provide care to patient at home.    Social worker also discussed SNF, however also explained patient's insurance does not always want to pay for both IPR and SNF.  Mitchell Lucero verbalized understanding and will reach out to social worker with questions and concerns after her training on Thursday.  ? Info or Referral   Information or Referral to Community Resources: No Needs Identified  ? Discharge Planning   Discharge Planning: Durable Medical Equipment and Supplies, Home Health  ? Medication Needs   Medication Needs: No Needs Identified     ? Financial   Financial: No Needs Identified  ? Legal   Legal: No Needs Identified  ? Other        Disposition  ? Expected Discharge Date    Expected Discharge Date: 03/10/19  ? Transportation      ? Next Level of Care (Acute Psych discharges only)      ? Discharge Disposition          Durable Medical Equipment No service has been selected for the patient.      [place] Destination      No service has been selected for the patient.      [facility]      No service has been selected for the patient.      [facility]      No service has been selected for the patient.        Hilarie Fredrickson, LMSW   [phone]  [phone]

## 2019-03-01 NOTE — Progress Notes
PHYSICAL THERAPY     03/01/19 1330   Subjective   Subjective Pt was asleep upon arrival. He agreed to attempt therapy but doesn't know if he will make it 30 min.    Bed Mobility   Bed Mobility: Supine to sit assist level Moderate assistance   Bed Mobility: Supine to sit type of assist Assist with Bilateral lower extremities;With head of bed elevated   Transfers   Transfer: Assistive Device Hand Hold Assist   Transfer: Sit to stand assist level Moderate assistance;With two person;With one person  (improved to Ax1 with practice)   Transfer: Sit to stand type of assist Facilitation of weight shift;Facilitation of trunk;Blocking of Right lower extremity   Transfer: Stand to sit assist level Moderate assistance;With two person;Contact guard assistance   Transfer: Stand to sit type of assist For safety   Transfer: Stand pivot assist level Maximum assistance;With two person   Transfer: Stand pivot type of assist Facilitation of weight shift;Requires verbal cues for sequencing;Requires cues for positioning of feet   Wheelchair   Wheelchair: Distance 40 feet   Wheelchair: Assistance Level Stand by assistance   Wheelchair: Propulsion Method Bilateral upper extremity;Manual wheelchair   Wheelchair Comments Patient completed SBA on firm, straight surface. No veering noted as pt was able to self correct pathway.    Activity   PT Neuromuscular Re-Education Patient performed sit to stands using the pilates pole on the left to pull himself up & into midline with use of mirror feedback. Patient performed x6 reps with minimal to CGA to stand. We progressed to toe taps (one at a time) onto a 2 step while attempting to stay in midline. Pt requires moderate assist during dynamic standing with LUE support.    Assessment   Assessment Patient experienced some dizziness with sustained standing & was given rest breaks for the dizziness to resolve. He does well maintaining midline with mirror feedback & cues.    Plan Comments Mobility with compression (SBP goal >120), safety with transfers, bed mobility, wheelchair mobility, trial Nustep, gait at rail as pt tolerates.      Doretha Sou, PT, DPT

## 2019-03-01 NOTE — Progress Notes
OCCUPATIONAL THERAPY     03/01/19 1000   Precautions   Precautions   (compression on at all times for mobility, SBP goal > 120)   Cognitive   Patient Behavior Calm;Cooperative;Flat affect   Cognition Follows Commands   Subjective   Subjective Patient agreeable to participate in OT session.     Bathing   Bath/Shower Soap and water shower/bath  (Wife gave spongebath this morning)   Transfers   Transfer: Squat pivot assist level Moderate assistance;Maximum assistance;With two person   Transfer: Squat pivot type of assist Facilitation of trunk;Facilitation of weight shift;For safety   Transfer Comment WC <> mat   Postural Control   Balance: Sitting Static Poor +   Activity   Therapeutic Activities Addressing static sitting balance while seated on therapy mat while engaged in table top activity - Connect 4.  Patient requires frequent verbal and manual cues for anterior lean/weight shift due to retropulsion.  He was observed to intermittently hold on to table with RUE to prevent posterior loss of balance. He primarily used LUE for placing pieces in Connect 4 frame.  Patient demonstrating cognitive deficits during activity and was unable to identify where to place game pieces to either get 4 in a row or block opponent.     Assessment   Assessment Patient continues to demonstrate retropulsion with static sitting activity.  Demonstrates cognitive impairment having difficulty attending to and problem solving during familiar game.     Plan   Plan Comments Sitting balance, both static and dynamic, functional transfers, UB/LB dressing, eating, and grooming; functional cognition.       Molli Knock, OTR/L

## 2019-03-01 NOTE — Rehab Team Conference
Team Conference Note     Date of Admission:  02/23/2019  Date of Team Conference:  03/01/2019   Mitchell Lucero is a 68 y.o. male.     DOB: 15-May-1951                  MRN#:  [id number]    Team Conference   Attendees: Odie Sera MD, Attending Physician; Vinicius Jenetta Loges MD, Resident Physician; Hilarie Fredrickson SW, Social Work; Randel Pigg RN, Nurse Case Manager; Michaelyn Barter RN, Rehabilitation Program Coordinator; Mellody Dance PhD, ABPP, Neuropsychology; Henrene Dodge OTR, Occupational Therapy; Athena Masse DPT, Physical Therapy; Herbie Baltimore SLP, Speech Therapy; Dorann Ou RN, Nursing    Medical Update: 68 year old recently discharged from our acute inpatient rehabitation unit back to ED due to declining in functional status, nausea, vomit and trunk instability. 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 MRI of the brain confirmed. He stayed at [facility] for 2 days under neurology service and remains with marked speech dysfunction, impaired balance, endurance and ADLs. Re-addmited  to [facility].   Medical barriers include: persistent nausea, emesis, dizziness and poor balance/trunk instability secondary to cerebellar stroke. Poor appetite. He will need follow up with Neurology outpatient and with PM&R physician and rehab therapies.Re-scheduled hepatology clinic appt same day as biopsy on 8/28 (both appointments)    NeuroPsych: Mild anxiety and moderate depressed mood symptoms.    Team Goal:  Patient will perform: Moderate assistance, household mobility, at wheelchair level, With caregiver assistance  Pt will perform basic care and transfer with: Maximal assistance(least restrictive device, assistance with IADLs)     Discharge Planning     Discharge Date:  03/10/19  Wife Mitchell Lucero works, however can provide intermittent assist at home. Pt will stay with his mom during the day who is physically disabled, however can assist with supervision needs and medication management. Wife Mitchell Lucero is trained on diabetic medications and likely requires no further contact with DM education unless medications have changed since last admit. Pt owns rw and spc.    Type of Residence: Home, independent  Living Arrangements: Spouse/significant other, Family members(w/ spouse and mother )  Financial risk analyst / Tub: Psychologist, counselling  How many levels in the residence?: 2  Can patient live on one level if needed?: Yes  Does residence have entry and/or side stairs?: No  Assistance needed prior to admit or anticipated on discharge: Yes  Who provides assistance or could if needed?: pt's spouse and HH   Are they in good health?: Yes  Can support system provide 24/7 care if needed?: No(pt's spouse works during the day )  ??? Level of Function   Prior level of function: Needs assist with ADLs  Which ADLs require assistance?: Mobility, medication management, housekeeping, meal prep, transport  Who assists with ADLs?: pt's wife and [facility]     Plan Home with assistance;  home health OT/PT/ST    DME Owns all OT equipment, manual wheelchair, cushion & possibly sit to stand lift    Rehabilitation Plan     Progress  Progressed UB dressing from total-maximal assist  Good participation    Barriers/Concerns  FIST 41/56 (R side lean)  4/56 berg balance test  Significant tone in the RLE and some increase in the LLE   Initiated using the sit to stand lift due to complexity of transfer & need for Ax2.   Frequent emesis with  gait; limited to 20 ft  TUG about 3 minutes with Ax2  current AFO not working well; may need to reassess   High burden of care at this time- requiring total-maximal assist for all ADLS  Poor initiation & poor attention to task  Significant retropulsion during all sitting/standing tasks  Significant decline in function since his last stay. Patient will need assist with all IADL's and consistent supervision.    EVALUATION TO BE COMPLETED???-???RBANS ??????Repeatable Battery for the Assessment of Neuropsychological Status, Form A  Domain Subtest Total Score Index Score Classification Notes   Immediate Memory List Learning 13/40  65 Extremely low ???   ??? Story Memory 13/24 ??? ??? ???   Visuospatial/  constructional Figure Copy 14/20  58 Extremely low ???   ??? Line Orientation 10/20 ??? ??? ???   Language Picture Naming 10/10  57 Extremely low  ??? ???   ??? Semantic Fluency 7/40 ??? ??? ???   Attention Digit Span 6/16  49 Extremely low ???   ??? Coding 3/89 ??? ??? ???   Delayed Memory List Recall 2/10  52 Extremely low ???   ??? List Recognition 13/20 ??? ??? ???   ??? Story Recall 5/12 ??? ??? ???   ??? Figure Recall 5/20 ??? ??? ???   Sum of Index Scores ??? ??? ??? ???281 ???   TOTAL SCALE ??? ??? ??? 50 <0.1 %ile   ???  Plan  Mobility with compression (SBP goal >120), safety with transfers, bed mobility, wheelchair mobility, trial Nustep, gait at rail as pt tolerates.   LE dressing, toileting, functional cognition, dynamic sitting balance, BUE coordination/strengthening   A&O x3. Denies pain. No n/v at this time. Receiving 1L NS. Skin is warm and dry. Spouse at the bedside.  Family discussion regarding anticipated goals  for home discharge  Up to chair for 3/3 meals and assistance with eating, as needed  Sit at sink for grooming in evening with staff  Timed voiding    Goals   Weekly Goals  Patient will perform sit to supine with: Minimum assistance, Progressing  Patient will perform supine to sit with: Minimum assistance, Progressing  Patient will complete sit to stand transfer with: Moderate assistance, Progressing  Patient will complete stand to sit transfer with: Moderate assistance, Progressing  Patient will complete stand pivot transfer with: Moderate assistance, Progressing  Patient will ambulate: 50 feet, Moderate assistance, Least assistive device, Progressing  Patient will ascend/descend: (discontinued) Weekly Wheelchair Goals: Patient will propel manual wheelchair on level surfaces with  Patient will propel manual wheelchair on level surfaces with: Stand by assistance  Weekly Goals  Patient Will Perform Bathing: w/ Moderate Assist  Patient Will Perform UE Dressing: w/ Moderate Assist  Patient Will Perform LE Dressing: w/ Moderate Assist  Patient Will Perform All ADL's: w/ Stand By Assist  Patient Will Perform Grooming: w/ Stand By Assist, in Chair  Patient Will Perform Toileting: w/ Moderate Assist  Pt Will Perform All Functional Transfers: Moderate Assist  Pt Will Transfer To Shower: w/ Moderate Assist      Functional Independence Measures   Eating FIM: 4 - Minimal contact assistance, patient performs 75% or more of eating tasks  Grooming FIM: 5 - Set up  Bathing FIM: 1 - Total assistance, patient performs less than 25% of grooming/bathing/dressing/toileting tasks  Dressing - Upper Body FIM: 2 - Maximal assistance, patient performs 25-49% of grooming/bathing/dressing/toileting tasks  Dressing - Lower Body FIM: 1 - Total assistance, patient performs less than 25%  of grooming/bathing/dressing/toileting tasks  Toileting FIM: 1 - Total assistance, patient performs less than 25% of grooming/bathing/dressing/toileting tasks  Bladder FIM: 5 - Emptying device by staff  Bowel FIM: 1 - Total assistance, patient expends less than 25% effort (complete % of assist, if change diapers, total assist)  Transfers FIM: 2 - Maximal assistance, patient performs 25-49% of transferring tasks  Toilet Transfers FIM: 1 - Total assistance, two or more people   Shower Transfers FIM: 1 - Total assistance, two or more people  Gait: 1 - Toal assistance, two or more people, < 50 feet  Wheelchair FIM: 3 - Moderate assistance, patient expends 50-74% effort, greater than or equal to 150 feet  Stairs FIM: 0 - Activity did not occur - Safety  Comprehension FIM: 4 - Minimal prompting. Understands directions/conversation about basic daily needs 75-90%  Expression FIM: 4 - Minimal prompting - Expresses directions/conversation about basic daily needs 75-90%  Social Interaction FIM: 4 - Minimal direction - Able to interact appropriately 75-90%  Problem Solving FIM: 3 - Moderate prompting - Able to solve routine problems 50-74%. Needs direction less than half of the time to initiate, plan or complete daily activities  Memory FIM: 3 - Moderate promting - Able to recognize people frequently encountered, remembers daily routines, responds to requests of others 50-74% of the time, needs prompting less than half of the time

## 2019-03-02 LAB — CULTURE-URINE W/SENSITIVITY
Lab: >10
Lab: >10 — AB

## 2019-03-02 LAB — POC GLUCOSE
Lab: 107 mg/dL — ABNORMAL HIGH (ref <100)
Lab: 175 mg/dL — ABNORMAL HIGH (ref 70–100)
Lab: 243 mg/dL — ABNORMAL HIGH (ref 70–100)

## 2019-03-02 LAB — CBC
Lab: 12 g/dL — ABNORMAL LOW (ref 13.5–16.5)
Lab: 141 10*3/uL — ABNORMAL LOW (ref 150–400)
Lab: 15 % — ABNORMAL HIGH (ref 11–15)
Lab: 3.9 M/UL — ABNORMAL LOW (ref 4.4–5.5)
Lab: 31 pg (ref 26–34)
Lab: 34 g/dL (ref 32.0–36.0)
Lab: 35 % — ABNORMAL LOW (ref 40–50)
Lab: 6.6 10*3/uL (ref 4.5–11.0)
Lab: 7.4 FL (ref 7–11)
Lab: 90 FL (ref 80–100)

## 2019-03-02 LAB — BASIC METABOLIC PANEL CELLULAR THERAPEUTICS
Lab: 139 MMOL/L — ABNORMAL HIGH (ref 137–147)
Lab: 3.9 MMOL/L — ABNORMAL HIGH (ref 3.5–5.1)

## 2019-03-02 MED ORDER — SODIUM CHLORIDE 0.9 % FLUSH
3-5 mL | Freq: Three times a day (TID) | 0 refills | Status: DC
Start: 2019-03-02 — End: 2019-03-09

## 2019-03-02 NOTE — Progress Notes
I have reviewed the notes, assessments, and/or procedures performed by Brynlee Robbins, RN and concur with her/his documentation unless otherwise noted.

## 2019-03-02 NOTE — Progress Notes
SPEECH-LANGUAGE PATHOLOGY     03/02/19 0900   Orientation Goals   Therapy Activities Orientation log score   Orientation Log Score (Out of 30) 28/30   Patient Will Perform on the Orientation Log with Progressing   Patient Will be Oriented (to Person, Place, Time and Situation) to Improve Safety and Awareness for Daily Living with Progressing   Problem Solving Goals   Therapy Activities Problem solving/reasoning;Financial management/money skills   Financial Management/Money Skills Counting currency;Minimal prompting - patient able to 50-74% of the time   Therapy Activity Comments Counting Currency - Level 3:  57% accy. Memory when putting cash and coins together for the final amount was difficult.  Patient also exhibited difficulty when there were more than 4 coins.  Performance improved when given cues, structure and verbal reminders.  Patient open to feedback at the end of the task.   Discussed PO intake and asked the patient to identify foods that sound good to him.  A list was made and shared with the dietician.  Suggested 6 small meals per day is possible, providing small snacks throughout the day.  Suspect patient's initiation will be inadequate to make decisions to fill out a menu and to ask for a snack. Mr. [name] may also need some 1:1 assist to set up his tray.   Patient Will Provide Appropriate Solutions to Problems of Daily Living with Progressing   Patient Will Be Able to Problem Solve Safety and Awareness for Daily Living Skills with Progressing   Motor Speech Goals   Therapy Activities Articulation;Prosody   Articulation Oral reading - sentences;Minimal prompting - patient able to 75-90% of the time   Prosody Pacing;Minimal prompting - patient able to 75-90% of the time   Therapy Activity Comments Repetition of sentences using biofeedback to improve articulation and prosody.  74% accy. min cues.     Patient Will Adhere to Strategies During Conversation to Improve Speech Intelligibility with Progressing   Patient Will Improve Speech Intelligibility for Communication with Progressing      Mitchell Lucero.A.L/CCC/SLP

## 2019-03-02 NOTE — Progress Notes
PHYSICAL THERAPY     03/02/19 1030   Subjective   Subjective Patient was supine in bed reporting some nausea; the RN was present to provide Zofran.    Bed Mobility   Bed Mobility: Supine to sit assist level Moderate assistance   Bed Mobility: Supine to sit type of assist Assist with trunk;With rail;With head of bed flat   Bed Mobility: Sit to Supine Assist Level Maximum assistance   Bed Mobility: Sit to supine type of assist Assist with bilateral lower extremity;Assist with trunk  (guiding assist of the trunk)   Bed Mobility Comments Ax2 to boost up in bed.    Transfers   Transfer: Assistive Device None   Transfer: Sit to stand assist level Moderate assistance;Maximum assistance   Transfer: Sit to stand type of assist Facilitation of weight shift;Facilitation of trunk   Transfer: Stand to sit assist level Moderate assistance   Transfer: Stand to sit type of assist For safety   Transfer Comment Standing from the bed improved with cues for anterior weight shift prior to standing.    Activity   PT Neuromuscular Re-Education Pt performed dynamic sitting balance reaching across midline to the left with his LUE x28 reps. Pt's resting position is a R side lean. He required SBA to reach left and then CGA to facilitate an anterior lean to place the object down just beyond his feet.    Assessment   Assessment Due to nausea, a modified session was performed in the room at bed level. Pt was able to participate without any emesis. His sit to stand transfers improved with cues for forward lean.    Plan   Comments Mobility with compression (SBP goal >120), safety with transfers, bed mobility, wheelchair mobility, trial Nustep, gait at rail as pt tolerates.

## 2019-03-02 NOTE — Progress Notes
PHYSICAL THERAPY     03/02/19 1330   Subjective   Subjective Pt was incontinent upon arrival. Pt reports he alerted staff he needed to use the restroom & they said they would return.    Bed Mobility   Bed Mobility: Supine to sit assist level Moderate assistance   Bed Mobility: Supine to sit type of assist Assist with trunk   Transfers   Transfer: Assistive Device None;Mechanical Lift   Transfer: Sit to stand assist level Total assistance;Moderate assistance   Transfer: Sit to stand type of assist Facilitation of weight shift;Facilitation of trunk   Transfer: Stand to sit assist level Moderate assistance   Transfer: Stand pivot assist level Moderate assistance;With two person   Toileting   Toileting-Adjusting clothing BEFORE using toiet, commode, bedpan or urinal Assist Yes   Toileting-Wipe Self Assist Yes   Toileting-Adjust clothing AFTER using toilet, commode, bedpan or urinal Assist Yes   Toileting Assist Total Assist   Toileting Position Standing  (mechanical lift)   Daily Care   Urine Occurrence 1   Accident - Bladder Large   Urine Output Source Diaper/Brief;Void   Urine Description Unable to assess or not observed   Patient continent of bowel? No   Stool Occurrence 1   Accident  - Bowel Small   Stool Amount Medium   Stool Appearance Pasty   Stool Color Tan   Last Bowel Movement Date 03/02/19   Gait   Gait Distance 5 feet  (x2 )   GAIT: Assist Level Maximum assistance   GAIT: Type of Assist Facilitation of weight shift;Progression of right lower extremity  (assist/cues for step placement (wider))   Gait: Engineer, site  (left, with mirror feedback)   Gait: Patterns Decreased gait velocity;Trunk lean left;Trunk lean right;Narrow base of support;Scissoring left lower extremity;Scissoring right lower extremity   Gait: Deviation Left Decreased stride length   Gait: Deviation Right Decreased stride length   Activity Limited By Complaint of dizziness;Complaint of fatigue   Assessment Assessment Patient demonstrates a lot of difficulty weight shifting for transfers & gait. He demonstrates a narrow BOS with scissoring (likely due to tone & spasticity). He is only able to walk using the L hand rail as use of the R rail causes his R side lean to be more pronounced.    Plan   Comments Mobility with compression (SBP goal >120), safety with transfers, bed mobility, wheelchair mobility, trial Nustep, gait at rail as pt tolerates.      Doretha Sou, PT, DPT

## 2019-03-02 NOTE — Progress Notes
CLINICAL NUTRITION                                                        Clinical Nutrition Follow-Up Assessment     Name: Mitchell Lucero        MRN: [id number]          DOB: February 21, 1951          Age: 68 y.o.  Admission Date: 02/23/2019             LOS: 7 days        Recommendation:  ??? Continue No Carb Limit/Regular diet as ordered.     ??? Encourage PO intake and intake of Boost GC.     ??? Pt requires assist with all meals.     ??? Offer snacks between meals.     Comments:  Pt is a 68 y.o. male readmitted to [facility] after RR with PMH of HTN, DM2, C6-C7 ADCF in 1997, and recent left pontine stroke in 12/18/2018 who was transferred to [facility] for a transient episode of dysarthria for 1.5 hrs in the morning of 7/20 associated with R arm large amplitude irregular, involuntary movement and both leg shaking with retained awareness. Increasing number of falls since discharge from [facility]. Unintentional wt loss of 30# x1 year. Clinical Nutrition following previously for poor appetite, which continues to decline. During this admission pt was also dx with [facility], likely secondary to NASH. Hepatology and oncology following. AFP WNL. Pt was taking metformin PTA, but no longer taking per previous notes. Pt and his wife have previously received diabetic diet education. Pt???s wife managing meals and DM meds since his stroke. She has written materials and reports confidence preparing a carbohydrate controlled diet. Pt intake declining. RD able to eat lunch with pt this afternoon. Pt difficult to wake and he struggled with fatigue during his meal. Pt requires assistance with cutting food and cueing bites. Boost provided. Pt has been experiencing intermittent N/V, but denies n/v/c/d today. Endorses poor appetite. SLP reports pt is easily overwhelmed by large plates. Pt more appropriate for small, frequent meals/snacks. Diet order changed to Regular. Preferred foods list compiled with nutrition assistants to aid pt in ordering. Pt inappropriate to complete his own menu. RD will continue to monitor.                            Nutrition Assessment of Patient:  BMI (Calculated): 26.45; BMI Categories Adult: Over Weight: 25-29.9  Pertinent Allergies/Intolerances: none  Pertinent Labs: reviewed; Pertinent Meds: reviewed; Unintentional Weight Loss: 20% in 1 year (significant)  Oral Diet Order: Diabetic carb counting (No carb limit);Regular; Oral Supplement: Boost glucose control;TID  Current Oral Intake: Inadequate  Estimated Calorie Needs: 1590-1911  Estimated Protein Needs: 76-82    Malnutrition Assessment:  Does not meet criteria     Nutrition Focused Physical Assessment:  Loss of Subcutaneous Fat: No;  ;    Muscle Wasting: Yes; Severity: Moderate; Location: Clavicle, Temple, Intercostal, Interosseous, Calf, Deltoid  Edema: No;  ;    Pressure Injury: none noted    Nutrition Diagnosis:  Inadequate oral intake  Etiology: recent poor appetite; slowly improving  Signs & Symptoms: pt and wife reports     Intervention / Plan:  Mont[place] weight, labs, meds, Gi  symptoms, PO intake     Goals:  Patient to consume >75% of meals/supplements  Time Frame: Throughout stay  Status: Not met;Ongoing           Francee Nodal, MS, RD, LD   [place]: [phone]  Office Phone: [phone]

## 2019-03-02 NOTE — Progress Notes
OCCUPATIONAL THERAPY     03/02/19 0930   Subjective   Subjective I just went to the bathroom.  Patient indicating that he had a bowel movement in brief while in bed.   After toileting and UB dressing, patient had dry heaves for several minutes.     Grooming   Grooming - Wash Both Hands Assist No   Grooming Assist Stand By Assist   Grooming Position Sitting in chair   Grooming Comments Extra time required for washing hands.  Ataxia noted in RUE.    Upper Body Dressing   UE Dressing Assist Minimal Assist   Upper Dressing Position Sitting in chair   Upper Dressing Comments Dons tee shirt with assist to pull down in back.     Lower Body Dressing   LE Dressing Assist Total Assist   Lower Dressing Position Other (Comment)  (Bed level)   Lower Dressing Garments/Equipment Compression stockings   Lower Dressing Comments Patient reporting fatigue after UB dressing/toileting.  Requested to return to bed level for LB dressing.     Toileting   Toileting-Adjusting clothing BEFORE using toiet, commode, bedpan or urinal Assist Yes   Toileting-Wipe Self Assist Yes   Toileting-Adjust clothing AFTER using toilet, commode, bedpan or urinal Assist Yes   Toileting Assist Total Assist   Toileting Position Standing  (2 person assist)   Toileting Comments Incontinent of stool in brief noted during clothing management.  Requires maximum assist for standing of 1 and assist of 2nd person for all clothing management and hygiene.     Toileting Oceanographer Technique Stand pivot to right  (and L)   Toilet Transfer Assist Maximum assistance;WIth two person   Acupuncturist Grab bar - left   Bed Mobility   Bed Mobility: Supine to sit assist level Moderate assistance   Bed Mobility: Supine to sit type of assist Assist with Bilateral lower extremities;With head of bed elevated   Bed Mobility: Sit to Supine Assist Level Maximum assistance;Assist of two   Bed Mobility: Sit to supine type of assist Assist with bilateral lower extremity;Assist with trunk   Transfers   Transfer: Assistive Device None   Transfer: Sit to stand assist level Maximum assistance;With one person   Transfer: Stand to sit assist level Moderate assistance   Transfer: Stand pivot assist level Maximum assistance;With two person   Postural Control   Balance: Sitting Static Fair  (seated on toilet)   Assessment   Assessment Patient requires 2 person assist for stand pivot transfers and toileting.     Plan   Plan Comments Sitting balance, both static and dynamic, functional transfers, UB/LB dressing, eating, and grooming; functional cognition, family training.      Henrene Dodge, OTR/L

## 2019-03-03 LAB — POC GLUCOSE
Lab: 150 mg/dL — ABNORMAL HIGH (ref 70–100)
Lab: 87 mg/dL (ref 70–100)
Lab: 152 mg/dL — ABNORMAL HIGH (ref 70–100)
Lab: 152 mg/dL — ABNORMAL HIGH (ref 70–100)

## 2019-03-03 NOTE — Progress Notes
PHYSICAL THERAPY     03/03/19 1400   Subjective   Subjective Patient's wife wants to trial the sit to stand hydraulic lift and perform a transfer. She also states her son is building them a ramp on the back of the house to get to the other level of the home.    Bed Mobility   Bed Mobility: Supine to sit assist level Moderate assistance   Bed Mobility: Supine to sit type of assist Assist with trunk   Bed Mobility Comments Pt sequences through logroll & comes to partial sitting; he needs assist only for trunk righting.    Transfers   Transfer: Assistive Device None;Mechanical Lift   Transfer: Sit to stand assist level Moderate assistance  (CGA of 2nd)   Transfer: Sit to stand type of assist Blocking of Right lower extremity   Transfer: Stand to sit assist level Moderate assistance   Transfer: Stand pivot assist level Moderate assistance;With two person   Transfer Comment Pt's wife performed a stand pivot to the left with the patient and CGA from therapist. I talked her through how to instruct the patient to perform a skilled transfer away from the pushing side. I demonstrated the hydraulic lift & then she performed the second transfer with the lift.    Assessment   Assessment The patient is making gains with compensatory strategies for transfers. His was was able to transfer him with assistance today using both a 2nd person vs the sit to stand lift. The family is making plans to get the patient home safely.    Plan   Comments Mobility with compression (SBP goal >120), safety with transfers, bed mobility, wheelchair mobility, trial Nustep, gait at rail as pt tolerates. Custom wheelchair evaluation.      Doren Custard, PT, DPT

## 2019-03-03 NOTE — Case Management (ED)
Case Management Progress Note    NAME:Mitchell Lucero                          MRN: [id number]              DOB:Nov 19, 1950          AGE: 68 y.o.  ADMISSION DATE: 02/23/2019             DAYS ADMITTED: LOS: 8 days      Today???s Date: 03/03/2019    Plan  Anticipated discharge to home with consistent supervision and physical assistance on 8/27. Pt recommended for ongoing RN/PT/OT/ST.    Interventions  ? Support   Support: Pt/Family Updates re:POC or DC Plan   SW spoke with pt's wife [name] and she stated she feels she can provide adequate care to pt at home. SW discussed hiring caregiver and she stated she knows someone from church she is going to reach out to for assistance.    [name] verbalized she is taking off at least a week initially and believes pt will sit on couch with his mom and not do much while she is not there until she returns around noon. She wants to hire someone to help for a couple of hours a day.    [name] explained her biggest concern is getting in him in/out of the vehicle. SW educated on therapy car transfer training and she plans to speak to the PT.    [name] would still like to use [facility] for ongoing services.    [name] inquired about dme and SW advised her to speak with therapy. Team will order wc and this will be delivered prior to his dc.    Per discussion with OT Corrie Dandy she has talked to her about the sara steady and showed her where to purchase this on [place].    ? Info or Referral   Information or Referral to Community Resources: No Needs Identified  ? Discharge Planning   Discharge Planning: Home Health, Durable Medical Equipment and Supplies  ? Medication Needs   Medication Needs: No Needs Identified     ?  Financial   Financial: No Needs Identified  ? Legal   Legal: No Needs Identified  ? Other        Disposition  ? Expected Discharge Date    Expected Discharge Date: 03/10/19  ? Transportation      ? Next Level of Care (Acute Psych discharges only)      ? Discharge Disposition Durable Medical Equipment      No service has been selected for the patient.      [facility]      No service has been selected for the patient.      [facility]      No service has been selected for the patient.       Dialysis/Infusion      No service has been selected for the patient.        Hilarie Fredrickson, LMSW   [phone]  [phone]

## 2019-03-03 NOTE — Progress Notes
PHYSICAL THERAPY     03/03/19 1030   Pain   Pain Scale No Pain   Subjective   Subjective Patient's wife is present for training.    Transfers   Transfer: Assistive Device None;Mechanical Lift   Transfer: Sit to stand assist level Moderate assistance   Transfer: Sit to stand type of assist Facilitation of weight shift;Facilitation of trunk;Blocking of Right lower extremity   Transfer: Stand to sit assist level Moderate assistance;With two person  (CGA of 2nd person)   Transfer: Stand to sit type of assist For safety   Transfer: Stand pivot assist level Moderate assistance;With two person  (minimal assist of 2nd person)   Transfer: Stand pivot type of assist Facilitation of weight shift;Facilitation of trunk;Requires verbal cues for sequencing   Transfer: Car assist level With two person   Transfer: Car type of assist Requires cues for positioning of hands;Requires verbal cues for sequencing   Transfer Comment A car transfer was practice with use of the sit to stand lift & without it. It requires Ax2 to get into the car 2/2 pt's extensor tone, watching his head & positioning the hips deep enough in the seat. He was able to transfer out of the car with moderate assist x1 and CGA of a second. Pt was able to follow instructions to participate and weight shift during the transfer to side a couple of side steps to pivot.    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 She voiced understanding & realizes how this is different from his previous stroke.    Assessment   Assessment The focus of the session was on safe transfers and problem solving the best way to do a car transfer. The patient's wife is very willing to participate & would like to take him home at discharge. She would like her son to also be able to have transfer training (he lives next door to them).    Plan Comments Mobility with compression (SBP goal >120), safety with transfers, bed mobility, wheelchair mobility, trial Nustep, gait at rail as pt tolerates. Custom wheelchair evaluation.    Recommendations   PT Discharge Recommendations Home with Assistance;Home Health Setting   Equipment Recommendations Wheelchair - Manual;Wheelchair cushion  (sit to stand lift)   Expected Discharge Date 03/15/19     Doretha Sou, PT, DPT

## 2019-03-03 NOTE — Progress Notes
SPEECH-LANGUAGE PATHOLOGY     03/03/19 0900   Memory Goals   Therapy Activities Education   Education Patient's wife Aggie Cosier was present for education.  Discussed patient's reduced initiation and memory impairment, which has worsened this admit.  Identified strategies for the patient with the input from Mrs. Liggett and the [employer].  Patient would benefit from a calendar  with upcoming events, this is what he used at home.  Talking about information he needs to remember and why with additional visual reminders. Patient will benefit from structure and consistent assist with medication management at this time due to his cognitive and language impairments.     Patient Will Demonstrate/Recall Safety Sequences with Progressing;Minimal prompting - patient able to 50-74% of the time   Patient Will Use Appropriate Memory Strategies to Recall Recent Events, Express Needs, and Maintain Safety for Daily Living with Progressing;Minimum assist;Moderate assist   Motor Speech Goals   Therapy Activities Articulation;Prosody   Therapy Activity Comments Repetition task with auditory feedback provided.  93% accy with min to mod cues.  Reviewed strategies: increase volume (take a deeper breath, re-position) one word at a time.  Patient observed to improved articulation today when he used his strategies. Patient required reminders >50% of the time.  Mr. Jurgens reported today he does not hear his speech problem.  Mrs. 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  Reviewed strategies again that aided in improved intelligibility.   Patient Will Adhere to Strategies During Conversation to Improve Speech Intelligibility with Progressing;Minimal prompting - patient able to 50-74% of the time   Patient Will Improve Speech Intelligibility for Communication with Progressing;Minimum assist;Moderate assist   Verbal Expression Goals   Therapy Activities   (Word Retrieval) Therapy Activity Comments Word Retrieval Activity:  Convergent Naming - concrete level:  90% accy min cues. Divergent Naming - concrete level: 80% accy min to mod cues.  Sentence Completion: 70% accy min cues.  Mrs. Cunningham was able to observe new onset of language problems.  Conversation noted reduced length of utterance and word retrieval difficulty.  Provided suggestions regarding functional activities they can do together.    Patient Will Express Themselves During a 5-10 Minute Conversation with Progressing;Minimal prompting - Patient able to 75-90% of the time   Patient Will Express Basic Wants and Needs for Daily Living Skills with Progressing;Minimum assist;Moderate assist     Mrs. Mormile did report that cutting up her husbands food and providing smaller portion did improve intake today.     Waynette Buttery.A.L/CCC/SLP

## 2019-03-04 LAB — POC GLUCOSE
Lab: 224 mg/dL — ABNORMAL HIGH (ref 70–100)
Lab: 120 mg/dL — ABNORMAL HIGH (ref 70–100)
Lab: 220 mg/dL — ABNORMAL HIGH (ref 70–100)
Lab: 215 mg/dL — ABNORMAL HIGH (ref 70–100)

## 2019-03-04 LAB — BASIC METABOLIC PANEL CELLULAR THERAPEUTICS: Lab: 140 MMOL/L — ABNORMAL LOW (ref >60)

## 2019-03-04 LAB — CBC CELLULAR THERAPEUTICS: Lab: 7 K/UL — ABNORMAL LOW (ref <0.4)

## 2019-03-04 MED ORDER — POLYETHYLENE GLYCOL 3350 17 GRAM PO PWPK
1 | Freq: Every day | ORAL | 0 refills | Status: DC
Start: 2019-03-04 — End: 2019-03-09
  Administered 2019-03-04 – 2019-03-09 (×6): 17 g via ORAL

## 2019-03-04 NOTE — Progress Notes
PHYSICAL THERAPY       03/04/19 1100   Subjective   Subjective Patient upright in WC in room. Patient agreeable to particiapte in therapy.   Bed Mobility   Bed Mobility: Supine to sit assist level Moderate assistance   Bed Mobility: Supine to sit type of assist Assist with trunk;Assist with Left lower extremity   Bed Mobility: Sit to Supine Assist Level Maximum assistance   Bed Mobility: Sit to supine type of assist Assist with bilateral lower extremity;Assist with trunk   Bed Mobility Comments Patient nauseous upon laying supine. Immediately assisted with patient sitting on edge of mat where patient began to vomit into emesis bag.    Transfers   Transfer: Assistive Device None   Transfer: Squat pivot assist level Maximum assistance   Transfer: Squat pivot type of assist Facilitation of hip extension;Facilitation of trunk;Facilitation of weight shift;Increased time to complete;Requires cues for positioning of feet;Requires verbal cues for sequencing   Transfer Comment Patient requires moderate verbal cueing for optimal body positioning after transfering.    Activity   PT Therapeutic Activities Transfers   Exercise Seated  (NuStep-8 min)   Assessment   Assessment Patient tolerates 8 minutes on NuStep well. Session ended early due to nausea and vomitting after sit->supine.    Plan   PT Plan Balance/vestibular training;Bed mobility training;Family / caregiver training;Gait training;Neuromuscular re-education;Therapeutic exercise;Stair training;Transfer training;Wheelchair mobility   Plan Frequency 5 Days per Week   Comments Mobility with compression (SBP goal >120), safety with transfers, bed mobility, wheelchair mobility, trial Nustep, gait at rail as pt tolerates. Custom wheelchair evaluation.    Recommendations   PT Discharge Recommendations Home with Assistance;Home Health Setting     Mellody Dance, SPT

## 2019-03-04 NOTE — Progress Notes
Pt had about 342ml of emesis around 1900. Pt said emesis was due to the prn daily dose of magnesium citrate he had taken. Dr. Rona Ravens was notified of this and that the patient had not had a bowel movement in greater than 24 hours. Dr. Rona Ravens ordered this RN to give the patient another dose of magnesium citrate. Pt drank a small amount. Pt had a large bowel movement around 0600.

## 2019-03-04 NOTE — Progress Notes
SPEECH-LANGUAGE PATHOLOGY     03/04/19 1000   Behavior   Comments* Affect remains flat, initiation mildly reduced.     Orientation Goals   Therapy Activities Orientation log score   Orientation Log Score (Out of 30) 25/30   Therapy Activity Comments Mild confusion regarding where he is, what brought him to the hospital and what he is working on.  Patient initially reported he was here to work on his "spine".  Patinet was able to be re-oriented   Patient Will Perform on the Orientation Log with Progressing   Patient Will be Oriented (to Person, Place, Time and Situation) to Improve Safety and Awareness for Daily Living with Progressing   Motor Speech Goals   Therapy Activities Articulation;Prosody   Articulation Oral reading - sentences;Minimal prompting - patient able to 75-90% of the time  (Repetition of sentences)   Therapy Activity Comments Repetition of sentences given auditory feedback.  Patient omitting final consonants and syllables occasionally.   Patient Will Adhere to Strategies During Conversation to Improve Speech Intelligibility with Progressing   Patient Will Improve Speech Intelligibility for Communication with Progressing   Verbal Expression Goals   Therapy Activities   (Word Retrieval)   Confrontation Naming Minimal prompting -75-90% of the time  (Verbs)   Therapy Activity Comments Verb picture description: 75% accy min to mod cues.  Sentence Completion- concrete level:  100% accy. Associations - 70% accy min to mod cues.  Cnovergent Naming - 66% accy.  Divergent Naming - concrete level: 80% accy.   Patient Will Express Themselves During a 5-10 Minute Conversation with Progressing;Minimal prompting - Patient able to 75-90% of the time   Patient Will Express Basic Wants and Needs for Daily Living Skills with Progressing;Minimum assist;Moderate assist    Arna Snipe.A.L/CCC/SLP

## 2019-03-04 NOTE — Progress Notes
OCCUPATIONAL THERAPY     03/03/19 0715   Subjective   Subjective Spouse, [name], present for family training and stating, "I really want Maxamilian to come home.  We're working together as a family to try and make this happen."   Environmental education officer   UE Dressing Assist Stand By Assist   Upper Dressing Position Sitting in chair   Upper Dressing Garments/Equipment Abdominal binder   Lower Body Dressing   LE Dressing Assist Total Assist   Lower Dressing Position Other (Comment)  (Bed)   Lower Dressing Garments/Equipment Compression stockings   Toileting   Toileting-Adjusting clothing BEFORE using toiet, commode, bedpan or urinal Assist Yes   Toileting-Wipe Self Assist Yes   Toileting Assist Total Assist   Toileting Comments Incontinent of urine in brief - changed at bed level.   Bed Mobility   Bed Mobility: Rolling Right Minimum assistance   Bed Mobility: Rolling Left Minimum assistance   Transfers   Transfer: Assistive Device Mechanical Lift   Transfer: Sit to stand assist level Moderate assistance   Transfer: Stand to sit assist level Moderate assistance   Transfer Comment Denna Haggard   Postural Control   Balance: Sitting Static Fair   Assessment   Assessment Spouse observed and participated in patient's  ADLs for family training.   Spouse verbalizes interest in [place] for use at home.    Plan   Plan Comments Sitting balance, both static and dynamic, functional transfers, UB/LB dressing, eating, and grooming; functional cognition, family training.      Molli Knock, OTR/L

## 2019-03-04 NOTE — Progress Notes
I have reviewed the notes, assessment, and/or procedures performed by Clemon Chambers, RN and concur with her/his documentation unless otherwise noted.

## 2019-03-04 NOTE — Progress Notes
OCCUPATIONAL THERAPY     03/04/19 0800   Precautions   Precautions   (compression on at all times for mobility, SBP goal > 120 )   Subjective   Subjective "I don't really like to eat breakfast."   Eating   Eating - Pick Up Utensil Assist Yes  (RUE - weighted and built up handle)   Eating-Pick up [place] Yes  (RUE)   Eating Assist Stand By Assist   Eating Position Sitting in chair   Eating Equipment Built-up handle;Weighted utensil   Eating Comments Minimal intake observed due to requiring extra time with self feeding.  PCA present at end of session to continue supervision and assist as able.     Upper Body Dressing   UE Dressing Assist Minimal Assist   Upper Dressing Position Sitting edge of bed   Upper Dressing Garments/Equipment Abdominal binder   Upper Dressing Comments Dons tee shirt while seated edge of bed.  Requires minimal to maximum assist for unsupported sitting balance.  Requires assist to pull down in front.   Lower Body Dressing   LE Dressing Assist Total Assist   Lower Dressing Position Other (Comment)  (Bed level)   Lower Dressing Garments/Equipment Compression stockings   Bed Mobility   Bed Mobility: Rolling Right Minimum assistance   Bed Mobility: Rolling Left Minimum assistance   Bed Mobility: Supine to sit assist level Moderate assistance   Bed Mobility: Supine to sit type of assist Assist with trunk;With rail;With head of bed flat   Transfers   Transfer: Assistive Device Mechanical Lift   Transfer Comment Using mechanical sit to stand lift with 2 persons for safety.     Assessment   Assessment Mechanical sit to stand lift was adequate for patient transfer assist.     Plan   Plan Comments Sitting balance, both static and dynamic, functional transfers, UB/LB dressing, eating, and grooming; functional cognition, family training.      Molli Knock, OTR

## 2019-03-04 NOTE — Progress Notes
I have offered multiple timed voids throughout shift, patient continues to refuse to void. States, "I don't need to go right now." I discussed with him that he had not voided since 0600 this morning and I felt like it would be best to get him up to the toilet to make sure he didn't void in his brief and give him an opportunity to void in the toilet. Pt continues to shake his head no and state, "I don't have to go right now." I offered use of the urinal so he would not have to get up, pt continued to refuse. Will continue to time void and attempt to get him to the toilet after dinner.

## 2019-03-05 LAB — POC GLUCOSE
Lab: 148 mg/dL — ABNORMAL HIGH (ref 70–100)
Lab: 123 mg/dL — ABNORMAL HIGH (ref 70–100)
Lab: 166 mg/dL — ABNORMAL HIGH (ref 70–100)
Lab: 121 mg/dL — ABNORMAL HIGH (ref 70–100)

## 2019-03-05 MED ORDER — PROMETHAZINE 25 MG/ML IJ SOLN
6.25 mg | Freq: Once | INTRAVENOUS | 0 refills | Status: AC
Start: 2019-03-05 — End: ?

## 2019-03-05 NOTE — Progress Notes
Pt did not have a bowel movement on this shift. He did not feel willing to try the magnesium citrate because he says, "it makes me sick". Pt was able to take Milk of Mag 3x on this shift. Pt was encouraged to drink fluids.

## 2019-03-05 NOTE — Progress Notes
I have reviewed the notes, assessment, and/or procedures performed by Catherine Devillier, RN and concur with her/his documentation unless otherwise noted.

## 2019-03-05 NOTE — Progress Notes
Rehabilitation Medicine Progress Note     Patient: Mitchell Lucero   [id number]   Date of Service: 03/05/2019  Date of Admission: 02/23/2019  Length of Stay: 10 days    Principal Problem:    Cerebellar stroke, acute (HCC)  Active Problems:    Orthostatic hypotension    HTN (hypertension)    Tremors of nervous system    T2DM (type 2 diabetes mellitus) (HCC)    History of neck surgery    Left pontine stroke (HCC)    TIA (transient ischemic attack)    Gait abnormality    Right hemiparesis (HCC)    Cirrhosis (HCC)    Hepatocellular carcinoma (HCC)    Dysarthria    Thrombocytopenia (HCC)    Occlusion of superior cerebellar artery    Impaired gait    Postural instability of trunk    Ataxia    Impaired mobility and ADLs    Acute ischemic stroke [facility])      ASSESSMENT & PLAN       Mitchell Lucero is a 68 y.o. male who is currently admitted to inpatient rehabilitation for Cerebellar stroke, acute (HCC).      Rehabilitation Plan  Rehabilitation: Patient will continue with comprehensive therapies including physical therapy, occupational therapy, speech & language pathology (as applicable), specialized rehab nursing, neuropsychology and physiatry oversight.    Goals: Moderate assistance, household mobility, at wheelchair level, With caregiver assistance  Recommended therapy after discharge: Home with Assistance, Home Health Setting  Recommended equipment: Wheelchair - Manual, Wheelchair cushion(sit to stand lift)  Tentative discharge date: 03/15/19    Daily Functional Update:  Transfers        Transfer: Assistive Device: None (03/04/2019 11:00 AM)    Transfer: Sit to stand assist level: Moderate assistance (CGA of 2nd) (03/03/2019  2:00 PM)    Transfer: Stand pivot assist level: Moderate assistance;With two person (03/03/2019  2:00 PM)    No data recorded   Gait/ Mobility        Gait: Assistive Device: Rail (left, with mirror feedback) (03/02/2019  1:30 PM)    GAIT: Assist Level: Maximum assistance (03/02/2019  1:30 PM) Gait Distance: 5 feet (x2 ) (03/02/2019  1:30 PM)    Wheelchair: Distance: 40 feet (03/01/2019  1:30 PM)    Wheelchair: Assistance Level: Stand by assistance (03/01/2019  1:30 PM)     Toileting        Toileting Assist: Total Assist (03/03/2019  7:15 AM)    Toileting Equipment: Grab bar - left (02/28/2019 10:30 AM)    Toilet Transfer Assist: Maximum assistance;WIth two person (03/02/2019  9:30 AM)     Dressing LE Dressing Assist: Total Assist (03/04/2019  8:00 AM)    UE Dressing Assist: Minimal Assist (03/04/2019  8:00 AM)         MEDICAL PROBLEMS:    Left pontine stroke (CVA), 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  -Goal long term BP from stroke perspective would be a little higher ideally around 110-140/70-90  ???             >???f/u OP with Mov Disorders neurology/requested   ?????????????????????             >???continue ASA, Plavix, Statin for secondary  stroke prevention               > BP management as below with some permissive hypertension to promote adequate perfusion  ???             >???consult OT, PT, SLP   > scheduled Zofran with meals   > started baclofen for central dizziness/nausea and increased tone but caution due to need to avoid hypotension and potential for sedation  ???  CARDIO:  Hx of HLD, HTN  Orthostatic Hypotension  -TTE normal EF, moderate LA dilation, no thrombus/shunt.  ???????????????????????????????????????>???continue rec Abd Binder and Ted Hose  ???????????????????????????????????????>???continue Midodrine, now 5mg  TID to maintain perfusion brain  ???????????????????????????????????????>???dc PTA flomax and Acei  ???????????????????????????????????????>???event monitor on d/c for further stroke risk factor assessment   ???????????????????????????????????????>???f/u cardio upon DC  ???  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  GI: HCC, portal HTN/ascites, NASH  Hyperammonemia due to decompensated cirrhosis  -Abdominal US shows hepatic mass  -Abdominal CT shows mass c/w hepatocellular carcinoma, cirrhosis, portal HTN, small ascites  ???????????????????????????????????????>???f/u GI OP for biopsy after DC from rehab/ requested appt (hold Plavix for at least 5 days prior to biopsy)  ???????????????????????????????????????>???continue lactulose (3-4 BM daily) + mag citrate if not had 2 BMs daily minimum   ???  ENDO:  T2DM  ???????????????????????????????????????> PTA Levemir 10 units QHS and metformin 500 mg BID  ???????????????????????????????????????>???hold metformin, lantus 5 units QHS, LDCF  ???  Pain Management: pain is well controlled, heat / ice PRN, topical medication and Tylenol PRN  Skin: Encourage the patient to continue to inspect their skin and perform pressure relief.  Bowel: continent of bowel  Bladder: monitor bladder continence, BVIs x3 until <120ml and ISC for >367ml  Nutrition: Current diet:  diabetic  Mental Health: consult neuropsychology to provide support / counseling; continue citalopram  DVT ppx: enoxaparin (LOVENOX) syringe 40 mg  QDAY  Code Status: Full Code  DC Planning:   > will need to see hepatology, biopsy must reschedule appt. F/u neuro needed      03/05/19 Assessment and Changes in Care Plan  Not having 2-3 BMs per day.  He is passing gas.  Will give mag citrate with zofran to help prevent nausea.       SUBJECTIVE/ History:     Patient seen resting comfortably at bedside this morning without any signs of acute distress.  No significant events noted overnight by nursing staff. Patient denies headache, chest pain, shortness of breath, abdominal pain, or myalgia.  + nausea and vomiting with mag citrate.      OBJECTIVE/ Physical Exam:     Vitals:    03/04/19 1212 03/04/19 1732 03/05/19 0010 03/05/19 0631   BP: (!) 143/85 (!) 140/60 116/68 122/72   BP Source: Arm, Right Upper Arm, Right Upper Arm, Right Upper    Pulse: 89 92 89 90   Temp: 36.4 ???C (97.6 ???F) 36.5 ???C (97.7 ???F) 36.8 ???C (98.2 ???F)    SpO2: 100% 98% 96% Weight:       Height:         Vitals:    02/23/19 1500 02/25/19 1500 02/28/19 0500   Weight: 72.1 kg (158 lb 15.2 oz) 72.1 kg (158 lb 15.2 oz) 70 kg (154 lb 5.2 oz)       Constitutional: no acute distress, appears stated age  ENMT: supple  Cardiovascular: Well perfused  Respiratory: normal  effort with respirations  Gastrointestinal: nondistended, no abdominal tenderness  Extremities: No edema  Skin: Warm and dry  Psychiatric:  Appropriate mood and affect  Musculoskeletal: No atrophy  Neurologic: speech fluent and clear, appropriate responses to questions         RESULTS AND DATA REVIEWED:      Current Medication List:  Scheduled Meds:aspirin EC tablet 81 mg, 81 mg, Oral, QAM8  atorvastatin (LIPITOR) tablet 80 mg, 80 mg, Oral, QHS  baclofen (LIORESAL) tablet 2.5 mg, 2.5 mg, Oral, TID  Bimatoprost (Lumigan) 0.01 % drop 1 drop ++patient own med++, 1 drop, Both Eyes, QDAY(21)  citalopram (CeleXA) tablet 20 mg, 20 mg, Oral, QAM8  clopiDOGrel (PLAVIX) tablet 75 mg, 75 mg, Oral, QDAY  docusate (COLACE) capsule 100 mg, 100 mg, Oral, BID  enoxaparin (LOVENOX) syringe 40 mg, 40 mg, Subcutaneous, QDAY  insulin aspart U-100 (NOVOLOG FLEXPEN) injection PEN 0-12 Units, 0-12 Units, Subcutaneous, ACHS (22)  insulin glargine (LANTUS SOLOSTAR) injection PEN 5 Units, 5 Units, Subcutaneous, QHS(22)  lactulose oral solution 20 g, 20 g, Oral, TID w/ meals  melatonin tablet 3 mg, 3 mg, Oral, QHS  midodrine (PROAMATINE) tablet 5 mg, 5 mg, Oral, TID  ondansetron (ZOFRAN ODT) rapid dissolve tablet 4 mg, 4 mg, Oral, ACHS  pantoprazole DR (PROTONIX) tablet 20 mg, 20 mg, Oral, QDAY  polyethylene glycol 3350 (MIRALAX) packet 17 g, 1 packet, Oral, QDAY  senna (SENOKOT) tablet 2 tablet, 2 tablet, Oral, QHS  sodium chloride PF 0.9% flush 3-5 mL, 3-5 mL, Flush, FLUSH TID    Continuous Infusions:  PRN and Respiratory Meds:acetaminophen Q4H PRN, aluminum/magnesium hydroxide Q4H PRN, bisacodyL QDAY PRN, magnesium citrate QDAY PRN, milk of magnesia (CONC) Q4H PRN, traZODone QHS PRN    Last Bowel Movement Date: 03/04/19 (03/04/2019  8:00 PM)    Post-Void(PVR) Bladder Scan (mL): 151 milliliters (02/28/2019  8:52 PM)    Straight Cath (mL): 320 (03/04/2019  7:17 PM)    Results for orders placed or performed during the hospital encounter of 02/23/19 (from the past 24 hour(s))   POC GLUCOSE    Collection Time: 03/04/19 11:57 AM   # # Low-High    Glucose, POC 220 (H) 70 - 100 MG/DL   POC GLUCOSE    Collection Time: 03/04/19  5:14 PM   # # Low-High    Glucose, POC 215 (H) 70 - 100 MG/DL   POC GLUCOSE    Collection Time: 03/04/19  9:35 PM   # # Low-High    Glucose, POC 148 (H) 70 - 100 MG/DL   POC GLUCOSE    Collection Time: 03/05/19  6:58 AM   # # Low-High    Glucose, POC 123 (H) 70 - 100 MG/DL     Chart reviewed, nursing notes and therapy notes reviewed.

## 2019-03-06 LAB — POC GLUCOSE
Lab: 153 mg/dL — ABNORMAL HIGH (ref 70–100)
Lab: 150 mg/dL — ABNORMAL HIGH (ref 70–100)
Lab: 184 mg/dL — ABNORMAL HIGH (ref 70–100)
Lab: 147 mg/dL — ABNORMAL HIGH (ref 70–100)

## 2019-03-06 LAB — BASIC METABOLIC PANEL: Lab: 138 MMOL/L (ref <150)

## 2019-03-06 NOTE — Progress Notes
PHYSICAL THERAPY  NOTE      Name: Mitchell Lucero        MRN: [id number]          DOB: 07-03-1951          Age: 68 y.o.  Admission Date: 02/23/2019             LOS: 11 days         03/06/19 1330   Subjective   Subjective Patient in bed upon arrival and reports nausea/vomitting with mobility.   Bed Mobility   Bed Mobility: Rolling Right Minimum assistance   Bed Mobility: Rolling Left Minimum assistance   Bed Mobility: Supine to sit assist level Moderate assistance   Bed Mobility: Supine to sit type of assist Assist with trunk;Assist with Left lower extremity   Bed Mobility Comments Rolling perfromed in bed for hygiene and changing shorts due to incontinence of bowels during supine to sit. Redness and lines from sheets noted during hygiene; RN notified. Patient sitting in bedside chair end of session.   Transfers   Transfer: Catering manager Assist   Transfer: Sit to stand assist level Moderate assistance  (CGA of 2nd person)   Transfer: Sit to stand type of assist Blocking of Right lower extremity   Transfer: Stand to sit assist level Moderate assistance   Transfer: Stand to sit type of assist For safety   Transfer: Stand pivot assist level Moderate assistance;With two person   Transfer: Stand pivot type of assist Facilitation of weight shift;Facilitation of trunk;Requires verbal cues for sequencing   Transfer Comment Patient having hard time with specific commands; able to transfer with general command of step over to chair and sit. Pt with nausea/vomitting upon sitting edge of bed and after transfer to chair.   Activity   PT Therapeutic Activities Bed mobility for hygiene/clothing change; transfer to chair with 2 person assist.   Assessment   Assessment Patient requires extra time for mobility today due to increased nausea/vomitting with all upright mobility. RN notified.   Plan   Comments Mobility with compression (SBP goal >120), safety with transfers, bed mobility, wheelchair mobility, trial Nustep, gait at rail as pt tolerates. Custom wheelchair evaluation.         Therapist: Lauretta Chester, PTA  Date: 03/06/2019

## 2019-03-06 NOTE — Progress Notes
Rehabilitation Medicine Progress Note     Patient: Mitchell Lucero   [id number]   Date of Service: 03/06/2019  Date of Admission: 02/23/2019  Length of Stay: 11 days    Principal Problem:    Cerebellar stroke, acute (HCC)  Active Problems:    Orthostatic hypotension    HTN (hypertension)    Tremors of nervous system    T2DM (type 2 diabetes mellitus) (HCC)    History of neck surgery    Left pontine stroke (HCC)    TIA (transient ischemic attack)    Gait abnormality    Right hemiparesis (HCC)    Cirrhosis (HCC)    Hepatocellular carcinoma (HCC)    Dysarthria    Thrombocytopenia (HCC)    Occlusion of superior cerebellar artery    Impaired gait    Postural instability of trunk    Ataxia    Impaired mobility and ADLs    Acute ischemic stroke [facility])      ASSESSMENT & PLAN       Mitchell Lucero is a 68 y.o. male who is currently admitted to inpatient rehabilitation for Cerebellar stroke, acute (HCC).      Rehabilitation Plan  Rehabilitation: Patient will continue with comprehensive therapies including physical therapy, occupational therapy, speech & language pathology (as applicable), specialized rehab nursing, neuropsychology and physiatry oversight.    Goals: Moderate assistance, household mobility, at wheelchair level, With caregiver assistance  Recommended therapy after discharge: Home with Assistance, Home Health Setting  Recommended equipment: Wheelchair - Manual, Wheelchair cushion(sit to stand lift)  Tentative discharge date: 03/15/19    Daily Functional Update:  Transfers        Transfer: Assistive Device: None (03/04/2019 11:00 AM)    Transfer: Sit to stand assist level: Moderate assistance (CGA of 2nd) (03/03/2019  2:00 PM)    Transfer: Stand pivot assist level: Moderate assistance;With two person (03/03/2019  2:00 PM)    No data recorded   Gait/ Mobility        Gait: Assistive Device: Rail (left, with mirror feedback) (03/02/2019  1:30 PM)    GAIT: Assist Level: Maximum assistance (03/02/2019  1:30 PM) Gait Distance: 5 feet (x2 ) (03/02/2019  1:30 PM)    Wheelchair: Distance: 40 feet (03/01/2019  1:30 PM)    Wheelchair: Assistance Level: Stand by assistance (03/01/2019  1:30 PM)     Toileting        Toileting Assist: Total Assist (03/03/2019  7:15 AM)    Toileting Equipment: Grab bar - left (02/28/2019 10:30 AM)    Toilet Transfer Assist: Maximum assistance;WIth two person (03/02/2019  9:30 AM)     Dressing LE Dressing Assist: Total Assist (03/04/2019  8:00 AM)    UE Dressing Assist: Minimal Assist (03/04/2019  8:00 AM)         MEDICAL PROBLEMS:    Left pontine stroke (CVA), 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  -Goal long term BP from stroke perspective would be a little higher ideally around 110-140/70-90  ???             >???f/u OP with Mov Disorders neurology/requested   ?????????????????????             >???continue ASA, Plavix, Statin for secondary  stroke prevention               > BP management as below with some permissive hypertension to promote adequate perfusion  ???             >???consult OT, PT, SLP   > scheduled Zofran with meals   > started baclofen for central dizziness/nausea and increased tone but caution due to need to avoid hypotension and potential for sedation  ???  CARDIO:  Hx of HLD, HTN  Orthostatic Hypotension  -TTE normal EF, moderate LA dilation, no thrombus/shunt.  ???????????????????????????????????????>???continue rec Abd Binder and Ted Hose  ???????????????????????????????????????>???continue Midodrine, now 5mg  TID to maintain perfusion brain  ???????????????????????????????????????>???dc PTA flomax and Acei  ???????????????????????????????????????>???event monitor on d/c for further stroke risk factor assessment   ???????????????????????????????????????>???f/u cardio upon DC  ???  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  GI: HCC, portal HTN/ascites, NASH  Hyperammonemia due to decompensated cirrhosis  -Abdominal US shows hepatic mass  -Abdominal CT shows mass c/w hepatocellular carcinoma, cirrhosis, portal HTN, small ascites  ???????????????????????????????????????>???f/u GI OP for biopsy after DC from rehab/ requested appt (hold Plavix for at least 5 days prior to biopsy)  ???????????????????????????????????????>???continue lactulose (3-4 BM daily) + mag citrate if not had 2 BMs daily minimum   ???  ENDO:  T2DM  ???????????????????????????????????????> PTA Levemir 10 units QHS and metformin 500 mg BID  ???????????????????????????????????????>???hold metformin, lantus 5 units QHS, LDCF  ???  Pain Management: pain is well controlled, heat / ice PRN, topical medication and Tylenol PRN  Skin: Encourage the patient to continue to inspect their skin and perform pressure relief.  Bowel: continent of bowel  Bladder: monitor bladder continence, BVIs x3 until <156ml and ISC for >311ml  Nutrition: Current diet:  diabetic  Mental Health: consult neuropsychology to provide support / counseling; continue citalopram  DVT ppx: enoxaparin (LOVENOX) syringe 40 mg  QDAY  Code Status: Full Code  DC Planning:   > will need to see hepatology, biopsy must reschedule appt. F/u neuro needed      03/06/19 Assessment and Changes in Care Plan  2 BMs so far today.  Nausea mildly improving, able to take medications.  May benefit from IV anti-pyretics.  No other issues/complaints today.      SUBJECTIVE/ History:     Patient seen resting comfortably at bedside this morning without any signs of acute distress.  No significant events noted overnight by nursing staff. Patient denies headache, chest pain, shortness of breath, abdominal pain, or myalgia.  Improving nausea, having BMs better.      OBJECTIVE/ Physical Exam:     Vitals:    03/05/19 1200 03/05/19 1755 03/06/19 0203 03/06/19 0600   BP: 117/66 120/65 132/75 (!) 141/77   BP Source: Arm, Right Upper Arm, Right Upper Arm, Right Upper Arm, Left Upper   Pulse: 83 84 77 80 Temp: 37.2 ???C (98.9 ???F) 37.3 ???C (99.1 ???F) 37.1 ???C (98.8 ???F) 36.9 ???C (98.4 ???F)   SpO2: 95% 92% 98% 95%   Weight:       Height:         Vitals:    02/23/19 1500 02/25/19 1500 02/28/19 0500   Weight: 72.1 kg (158 lb 15.2 oz) 72.1 kg (158 lb 15.2 oz) 70 kg (154 lb 5.2 oz)       Constitutional: no acute distress, appears stated age  ENMT: supple  Cardiovascular: Well  perfused  Respiratory: normal effort with respirations  Gastrointestinal: nondistended  Extremities: No edema  Skin: Warm and dry  Psychiatric:  Appropriate mood and affect  Musculoskeletal: No atrophy  Neurologic: speech fluent and clear, appropriate responses to questions      RESULTS AND DATA REVIEWED:      Current Medication List:  Scheduled Meds:aspirin EC tablet 81 mg, 81 mg, Oral, QAM8  atorvastatin (LIPITOR) tablet 80 mg, 80 mg, Oral, QHS  baclofen (LIORESAL) tablet 2.5 mg, 2.5 mg, Oral, TID  Bimatoprost (Lumigan) 0.01 % drop 1 drop ++patient own med++, 1 drop, Both Eyes, QDAY(21)  citalopram (CeleXA) tablet 20 mg, 20 mg, Oral, QAM8  clopiDOGrel (PLAVIX) tablet 75 mg, 75 mg, Oral, QDAY  docusate (COLACE) capsule 100 mg, 100 mg, Oral, BID  enoxaparin (LOVENOX) syringe 40 mg, 40 mg, Subcutaneous, QDAY  insulin aspart U-100 (NOVOLOG FLEXPEN) injection PEN 0-12 Units, 0-12 Units, Subcutaneous, ACHS (22)  insulin glargine (LANTUS SOLOSTAR) injection PEN 5 Units, 5 Units, Subcutaneous, QHS(22)  lactulose oral solution 20 g, 20 g, Oral, TID w/ meals  melatonin tablet 3 mg, 3 mg, Oral, QHS  midodrine (PROAMATINE) tablet 5 mg, 5 mg, Oral, TID  ondansetron (ZOFRAN ODT) rapid dissolve tablet 4 mg, 4 mg, Oral, ACHS  pantoprazole DR (PROTONIX) tablet 20 mg, 20 mg, Oral, QDAY  polyethylene glycol 3350 (MIRALAX) packet 17 g, 1 packet, Oral, QDAY  promethazine (PHENERGAN) injection 6.25 mg, 6.25 mg, Intravenous, ONCE  senna (SENOKOT) tablet 2 tablet, 2 tablet, Oral, QHS  sodium chloride PF 0.9% flush 3-5 mL, 3-5 mL, Flush, FLUSH TID    Continuous Infusions: PRN and Respiratory Meds:acetaminophen Q4H PRN, aluminum/magnesium hydroxide Q4H PRN, bisacodyL QDAY PRN, magnesium citrate QDAY PRN, milk of magnesia (CONC) Q4H PRN, traZODone QHS PRN    Last Bowel Movement Date: 03/06/19 (03/06/2019  7:00 AM)    Post-Void(PVR) Bladder Scan (mL): 151 milliliters (02/28/2019  8:52 PM)    Straight Cath (mL): 320 (03/04/2019  7:17 PM)    Results for orders placed or performed during the hospital encounter of 02/23/19 (from the past 24 hour(s))   POC GLUCOSE    Collection Time: 03/05/19 12:15 PM   # # Low-High    Glucose, POC 166 (H) 70 - 100 MG/DL   POC GLUCOSE    Collection Time: 03/05/19  4:43 PM   # # Low-High    Glucose, POC 121 (H) 70 - 100 MG/DL   POC GLUCOSE    Collection Time: 03/05/19 10:06 PM   # # Low-High    Glucose, POC 153 (H) 70 - 100 MG/DL   POC GLUCOSE    Collection Time: 03/06/19  6:59 AM   # # Low-High    Glucose, POC 150 (H) 70 - 100 MG/DL   BASIC METABOLIC PANEL    Collection Time: 03/06/19  7:47 AM   # # Low-High    Sodium 138 137 - 147 MMOL/L    Potassium 3.5 3.5 - 5.1 MMOL/L    Chloride 102 98 - 110 MMOL/L    CO2 29 21 - 30 MMOL/L    Anion Gap 7 3 - 12    Glucose 169 (H) 70 - 100 MG/DL    Blood Urea Nitrogen 13 7 - 25 MG/DL    Creatinine 1.61 0.4 - 1.24 MG/DL    Calcium 8.3 (L) 8.5 - 10.6 MG/DL    eGFR Non African American >60 >60 mL/min    eGFR African American >60 >60 mL/min  Chart reviewed, nursing notes and therapy notes reviewed.

## 2019-03-06 NOTE — Progress Notes
Patient's one time dose of IV promethazine was not received by the until roughly 2130. At this point patient's nausea had subsided. He was able to take all of his medications including his lactulose with no issue. The promethazine was then cancelled and Dr. Charissa Bash was updated. He had one small emesis in the middle of the night after being changed/ being rolled in bed multiple times. Pt has reported no more nausea or vomiting since.

## 2019-03-07 LAB — CBC CELLULAR THERAPEUTICS: Lab: 6.8 K/UL — ABNORMAL HIGH (ref 4.5–11.0)

## 2019-03-07 LAB — POC GLUCOSE
Lab: 136 mg/dL — ABNORMAL HIGH (ref 70–100)
Lab: 79 mg/dL (ref 70–100)
Lab: 186 mg/dL — ABNORMAL HIGH (ref 70–100)
Lab: 207 mg/dL — ABNORMAL HIGH (ref 70–100)

## 2019-03-07 LAB — BASIC METABOLIC PANEL CELLULAR THERAPEUTICS: Lab: 139 MMOL/L — ABNORMAL LOW (ref 137–147)

## 2019-03-07 MED ORDER — POTASSIUM CHLORIDE 20 MEQ PO TBTQ
40 meq | Freq: Once | ORAL | 0 refills | Status: CP
Start: 2019-03-07 — End: ?
  Administered 2019-03-07: 22:00:00 40 meq via ORAL

## 2019-03-07 NOTE — Progress Notes
SPEECH-LANGUAGE PATHOLOGY     03/07/19 0800   Orientation Goals   Therapy Activities Orientation log score   Orientation Log Score (Out of 30) 21/30   Therapy Activity Comments Patient stared at the information on his white board for extended periods of time before responding, at times unable to provide the correct response.  Patient with good delayed recall of the month, day and date.     Patient Will Perform on the Orientation Log with Progressing;Minimal prompting - patient able to 50-74% of the time   Patient Will be Oriented (to Person, Place, Time and Situation) to Improve Safety and Awareness for Daily Living with Progressing;Minimum assist;Moderate assist   Problem Solving Goals   Therapy Activities Problem solving/reasoning   Problem Solving/Reasoning Visual;Low level;Minimal prompting - patient able to 75-90% of the time   Therapy Activity Comments Responded to questions from weekly weather forecast - 83% accy min to mod cues.  Significant response delay.   Pragmatics Goals   Therapy Activities Topic maintenance   Topic Maintenance 6-10 minutes;Total assistance - patient able to < 25% of the time   Therapy Activity Comments Patient did no initiate.  Affect remains flat, patient often stares.  Upon arrival to the session patient staring, right arm had blood dripping from his blood draw site, patient unable to initiate call to the nurse.  Patient was chewing gum, shortly into the session the clinician asked if he wanted to spit out his gum, he said yes but had swallowed it.  Response time during the 1 hr session was moderately impaired.    Verbal Expression Goals   Therapy Activities   (Word retrieval:  Divergent Naming - concrete level)   Confrontation Naming Minimal prompting - 50-74% of the time   Conversation Minimal prompting - 50-74% of the time   Therapy Activity Comments Divergent Naming - concrete: 74% accy mod cues, significant response delay.  Conversation: slow to respond, patient had to backtrack multiple times in order to accurately communicate intent.   Memory was poor with regard to naming his children and grandchildren. Impaired recall of recent events. Mr. Coia did recall that his wife was with him for part of the weekend and that she had to take a granddaughter somewhere.   Patient Will Express Themselves During a 5-10 Minute Conversation with Progressing;Minimal prompting - patient able to 50-74% of the time   Patient Will Express Basic Wants and Needs for Daily Living Skills with Progressing;Minimum assist;Moderate assist      Waynette Buttery.A.L/CCC/SLP

## 2019-03-07 NOTE — Progress Notes
OCCUPATIONAL THERAPY     03/07/19 0900   Vitals*   Pulse 81   BP 135/79   $$ O2 Delivery RA   Subjective   Subjective Patient with minimal verbal responses today.     Upper Body Dressing   UE Dressing Assist Moderate Assist   Upper Dressing Position Sitting in chair   Upper Dressing Garments/Equipment Abdominal binder   Upper Dressing Comments Dons tee shirt with assist for threading over L elbow and pulling down in back.     Lower Body Dressing   LE Dressing Assist Total Assist  (2 person assist due to poor sitting balance.)   Lower Dressing Position Sitting edge of bed   Lower Dressing Garments/Equipment Compression stockings  (donned while supine)   Lower Dressing Comments Donned shorts, socks, shoes and R AFO while seated on edge of bed today and stood in hydraulic sit to stand lift for OT to pull shorts up over hips.     Daily Care   P.O. 120 mL   Bed Mobility   Bed Mobility: Rolling Right Maximum assistance   Bed Mobility: Supine to sit assist level Maximum assistance   Bed Mobility: Supine to sit type of assist Assist with Bilateral lower extremities;Assist with trunk;Requires extra time;With rail;With head of bed flat   Transfers   Transfer: Assistive Device Mechanical Lift;None   Transfer: Sit to stand assist level Maximum assistance   Transfer: Sit to stand type of assist Facilitation of hip extension;Facilitation of trunk;Facilitation of weight shift;Increased time to complete   Transfer: Stand to sit assist level Moderate assistance   Transfer: Stand to sit type of assist Facilitation of trunk;For safety   Transfer: Stand pivot assist level Moderate assistance;With two person  (towards L side.  Attempted to R, but unable to weight shift.)   Transfer Comment Hydraulic sit to stand lift for bed to [facility] transfer.    Activity   Therapeutic Activities After ADLs in room, patient was taken to therapy gym to address unsupported sitting balance while seated on therapy mat. Initially patient demonstrated a L lean.  Once mirror was placed in front of patient he was able to right self to the R in to midline and able to maintain position for approximately 10 minutes with close SBA.     Assessment   Assessment Patient currently requires 2 person assist for LB dressing seated edge of bed and also for setting him up on mechanical sit to stand lift due to poor sitting balance.     Plan   Plan Comments Sitting balance, both static and dynamic, functional transfers, UB/LB dressing, eating, and grooming; functional cognition, family training.        Henrene Dodge, OTR/L

## 2019-03-07 NOTE — Progress Notes
CLINICAL NUTRITION                                                        Clinical Nutrition Follow-Up Assessment     Name: Mitchell Lucero        MRN: [id number]          DOB: 1951/06/15          Age: 68 y.o.  Admission Date: 02/23/2019             LOS: 12 days        Recommendation:  ??? Continue No Carb Limit/Regular diet as ordered.   ??? Encourage PO intake and intake of Boost GC.   ??? Pt requires assist with all meals.   ??? Offer snacks between meals.    Comments:  Pt is a 67 y.o. male readmitted to [facility] after RR with PMH of HTN, DM2, C6-C7 ADCF in 1997, and recent left pontine stroke in 12/18/2018 who was transferred to [facility] for a transient episode of dysarthria for 1.5 hrs in the morning of 7/20 associated with R arm large amplitude irregular, involuntary movement and both leg shaking with retained awareness. Increasing number of falls since discharge from [facility]. Unintentional wt loss of 30# x1 year. Clinical Nutrition following previously for poor appetite, which continues to decline. During this admission pt was also dx with [facility], likely secondary to NASH. Hepatology and oncology following. AFP WNL. Pt was taking metformin PTA, but no longer taking per previous notes. Pt and his wife have previously received diabetic diet education. Pt???s wife managing meals and DM meds since his stroke. She has written materials and reports confidence preparing a carbohydrate controlled diet. Pt intake remains poor. RD able to observe pt eating lunch today. Pt requires assistance with cutting food and cueing bites. He reports he is tired of Boost Pt has experienced intermittent N/V, but denies n/v/c/d today. Endorses low appetite. SLP reports pt is easily overwhelmed by large plates. Pt more appropriate for small, frequent meals/snacks or foods separated out into smaller portions on one plate. Diet order changed to Regular. Preferred foods list compiled with nutrition assistants to aid pt in ordering. Pt inappropriate to complete his own menu. RD will continue to monitor.                            Nutrition Assessment of Patient:  BMI (Calculated): 26.45; BMI Categories Adult: Over Weight: 25-29.9  Pertinent Allergies/Intolerances: none  Pertinent Labs: reviewed; Pertinent Meds: reviewed; Unintentional Weight Loss: 20% in 1 year (significant)  Current Oral Intake: Inadequate;Improving  Estimated Calorie Needs: 1590-1911  Estimated Protein Needs: 76-82    Malnutrition Assessment:  Does not meet criteria     Nutrition Focused Physical Assessment:  Loss of Subcutaneous Fat: No;  ;    Muscle Wasting: Yes; Severity: Moderate; Location: Clavicle, Temple, Intercostal, Interosseous, Calf, Deltoid  Edema: No;  ;    Pressure Injury: none noted    Nutrition Diagnosis:  Inadequate oral intake  Etiology: recent poor appetite; slowly improving  Signs & Symptoms: pt and wife reports     Intervention / Plan:  Mont[place] weight, labs, meds, Gi symptoms, PO intake     Goals:  Patient to consume >75% of meals/supplements  Time Frame: Throughout stay  Status: Not met;Ongoing          Francee Nodal, MS, RD, LD   [place]: [phone]  Office Phone: [phone]

## 2019-03-07 NOTE — Telephone Encounter
Spoke with the patient's wife Clarene Critchley. She thinks that he should be discharged this Sunday. Chart documentation discusses possible discharge on 9/1. Will notify Dr. Silvestre Gunner office as well.    Dorris Carnes, RN BSN

## 2019-03-07 NOTE — Progress Notes
PHYSICAL THERAPY     03/07/19 1100   Pain   Pain Scale No Pain   Subjective   Subjective Pt reports nausea this morning & RN is with him providing Zofran. Pt needs extra time for communication today.    Transfers   Transfer: Assistive Device None   Transfer: Sit to stand assist level Maximum assistance   Transfer: Sit to stand type of assist Facilitation of hip extension;Facilitation of weight shift   Transfer: Stand to sit assist level Moderate assistance   Transfer: Stand to sit type of assist For safety   Transfer: Stand pivot assist level Moderate assistance;With two person   Transfer: Stand pivot type of assist Facilitation of trunk;Facilitation of weight shift;Facilitation of hip extension   Transfer Comment Performed subsequent stand pivot transfers with Ax1 (lift & lower assist, verbal cues to sequence).    Wheelchair   Wheelchair: Systems analyst: Assistance Level Stand by assistance   Wheelchair: Propulsion Method Bilateral upper extremity;Manual wheelchair  (K4)   Wheelchair Comments Patient performed a 25 ft K4 trial with moderate cues at a SBA level. It took 2:31 min to complete 25 ft on firm, level surface. See EMR for complete K5 wheelchair evaluation.    Activity   Other Activity Pt's wife was involved in the wheelchair evaluation by phone.    Assessment   Assessment Patient has improved midline sitting without UE support (UE support alters midline) but he fatigues into a posterior lean with prolonged unsupported sitting (>6 min). The patient participated in a custom wheelchair evaluation with [employer] representative present.    Plan   PT Plan Balance/vestibular training;Bed mobility training;Family / caregiver training;Gait training;Neuromuscular re-education;Therapeutic exercise;Transfer training;Wheelchair mobility   Comments Mobility with compression (SBP goal >120), safety with transfers, bed mobility, wheelchair mobility, trial Nustep, gait at rail as pt tolerates. Custom wheelchair evaluation.     Recommendations   PT Discharge Recommendations Home with Assistance;Home Health Setting   Equipment Recommendations Wheelchair - Manual;Wheelchair cushion  (sit to stand lift)   Expected Discharge Date 03/15/19   Weekly Goals   Patient will perform sit to supine with Minimum assistance;Progressing   Patient will perform supine to sit with Minimum assistance;Progressing   Patient will complete sit to stand transfer with Moderate assistance;Achieved   Patient will complete stand to sit transfer with Moderate assistance;Achieved   Patient will complete stand pivot transfer with Moderate assistance;Progressing   Patient will ambulate 50 feet;Moderate assistance;Progressing   Patient will propel manual wheelchair on level surfaces with Stand by assistance;Achieved   Goal(s) for the Stay   Patient will perform household mobility;at wheelchair level;Moderate assistance;With caregiver assistance;Progressing     Mitchell Lucero, PT, DPT

## 2019-03-07 NOTE — Progress Notes
PHYSICAL THERAPY     03/07/19 1300   Pain   Pain Scale No Pain   Subjective   Subjective Patient was finishing up lunch upon arrival. He was having difficulty due to R arm fatigue getting hand to mouth. Therapist assisted to let him finish dessert.    Transfers   Transfer: Assistive Device None   Transfer: Sit to stand assist level Moderate assistance   Transfer: Sit to stand type of assist Facilitation of weight shift;Facilitation of trunk   Transfer: Stand to sit assist level Moderate assistance   Transfer: Stand to sit type of assist For safety   Transfer: Stand pivot assist level Maximum assistance   Transfer: Stand pivot type of assist Facilitation of weight shift;Facilitation of trunk;Requires verbal cues for sequencing;Requires cues for positioning of hands;Requires cues for positioning of feet   Wheelchair   Wheelchair: Distance 25 feet  (x2)   Wheelchair: Assistance Level Stand by assistance   Wheelchair: Propulsion Method Bilateral upper extremity;Manual wheelchair  (K5)   Wheelchair Comments Patient performed the first trial in 2:11 min without gloves on metal rims. The second trial was completed with medical gloves on using metals rims for improved grip, and he completed it in 1:28 min.    Assessment   Assessment Pt will need rubber coated wheelchair rims & forward axle for improved reach & grip. Upon returning to the room, the patient had an episode of emesis. Further PM therapy will be deferred until after at least 1:30 to minimize mobility & subsequent nausea/emesis.    Plan   Comments Mobility with compression (SBP goal >120), safety with transfers, bed mobility, wheelchair mobility, trial Nustep, gait at rail as pt tolerates.        Doren Custard, PT, DPT

## 2019-03-07 NOTE — Progress Notes
Rehabilitation Medicine Progress Note     Patient: Mitchell Lucero   [id number]   Date of Service: 03/07/2019  Date of Admission: 02/23/2019  Length of Stay: 12 days    Principal Problem:    Cerebellar stroke, acute (HCC)  Active Problems:    Orthostatic hypotension    HTN (hypertension)    Tremors of nervous system    T2DM (type 2 diabetes mellitus) (HCC)    History of neck surgery    Left pontine stroke (HCC)    TIA (transient ischemic attack)    Gait abnormality    Right hemiparesis (HCC)    Cirrhosis (HCC)    Hepatocellular carcinoma (HCC)    Dysarthria    Thrombocytopenia (HCC)    Occlusion of superior cerebellar artery    Impaired gait    Postural instability of trunk    Ataxia    Impaired mobility and ADLs    Acute ischemic stroke [facility])      ASSESSMENT & PLAN       Mitchell Lucero is a 68 y.o. male who is currently admitted to inpatient rehabilitation for Cerebellar stroke, acute (HCC).      Rehabilitation Plan  Rehabilitation: Patient will continue with comprehensive therapies including physical therapy, occupational therapy, speech & language pathology (as applicable), specialized rehab nursing, neuropsychology and physiatry oversight.    Goals: Moderate assistance, household mobility, at wheelchair level, With caregiver assistance  Recommended therapy after discharge: Home with Assistance, Home Health Setting  Recommended equipment: Wheelchair - Manual, Wheelchair cushion(sit to stand lift)  Tentative discharge date: 03/15/19    Daily Functional Update:  Transfers        Transfer: Assistive Device: None (03/07/2019 11:00 AM)    Transfer: Sit to stand assist level: Maximum assistance (03/07/2019 11:00 AM)    Transfer: Stand pivot assist level: Moderate assistance;With two person (03/07/2019 11:00 AM)    No data recorded   Gait/ Mobility        Gait: Assistive Device: Rail (left, with mirror feedback) (03/02/2019  1:30 PM)    GAIT: Assist Level: Maximum assistance (03/02/2019  1:30 PM) Gait Distance: 5 feet (x2 ) (03/02/2019  1:30 PM)    Wheelchair: Distance: 40 feet (03/01/2019  1:30 PM)    Wheelchair: Assistance Level: Stand by assistance (03/01/2019  1:30 PM)     Toileting        Toileting Assist: Total Assist (03/03/2019  7:15 AM)    Toileting Equipment: Grab bar - left (02/28/2019 10:30 AM)    Toilet Transfer Assist: Maximum assistance;WIth two person (03/02/2019  9:30 AM)     Dressing LE Dressing Assist: Total Assist (2 person assist due to poor sitting balance.) (03/07/2019  9:00 AM)    UE Dressing Assist: Moderate Assist (03/07/2019  9:00 AM)         MEDICAL PROBLEMS:    Left pontine stroke (CVA), 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  -Goal long term BP from stroke perspective would be a little higher ideally around 110-140/70-90  ???             >???f/u OP with Mov Disorders neurology/requested   ?????????????????????             >???continue ASA, Plavix,  Statin for secondary stroke prevention               > BP management as below with some permissive hypertension to promote adequate perfusion  ???             >???consult OT, PT, SLP   > scheduled Zofran with meals   > started baclofen for central dizziness/nausea and increased tone but caution due to need to avoid hypotension and potential for sedation  ???  CARDIO:  Hx of HLD, HTN  Orthostatic Hypotension  -TTE normal EF, moderate LA dilation, no thrombus/shunt.  ???????????????????????????????????????>???continue rec Abd Binder and Ted Hose  ???????????????????????????????????????>???continue Midodrine, now 5mg  TID to maintain perfusion brain  ???????????????????????????????????????>???dc PTA flomax and Acei  ???????????????????????????????????????>???event monitor on d/c for further stroke risk factor assessment   ???????????????????????????????????????>???f/u cardio upon DC  ???  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  GI:  HCC, portal HTN/ascites, NASH  Hyperammonemia due to decompensated cirrhosis  -Abdominal US shows hepatic mass  -Abdominal CT shows mass c/w hepatocellular carcinoma, cirrhosis, portal HTN, small ascites  ???????????????????????????????????????>???f/u GI OP for biopsy after DC from rehab/ requested appt (hold Plavix for at least 5 days prior to biopsy)  ???????????????????????????????????????>???continue lactulose (3-4 BM daily) + mag citrate if not had 2 BMs daily minimum   ???  ENDO:  T2DM  ???????????????????????????????????????> PTA Levemir 10 units QHS and metformin 500 mg BID  ???????????????????????????????????????>???hold metformin, lantus 5 units QHS, LDCF  ???  Pain Management: pain is well controlled, heat / ice PRN, topical medication and Tylenol PRN  Skin: Encourage the patient to continue to inspect their skin and perform pressure relief.  Bowel: continent of bowel  Bladder: monitor bladder continence, BVIs x3 until <1106ml and ISC for >365ml  Nutrition: Current diet:  diabetic  Mental Health: consult neuropsychology to provide support / counseling; continue citalopram  DVT ppx: enoxaparin (LOVENOX) syringe 40 mg  QDAY  Code Status: Full Code  DC Planning:   > will need to see hepatology, biopsy must reschedule appt. F/u neuro needed      03/07/19 Assessment and Changes in Care Plan  Patient had several bowel movements on Sunday, no bowel movements today yet.  No significant changes in medical care.  Will use magnesium citrate if needed, if no bowel movement by the end of the day or a fleets enema.  Continue lactulose.  Will need at least 2 bowel movements minimum a day, ideally 3-4 bowel movements.      SUBJECTIVE/ History:     Patient was seen working with occupational therapy in the gym.  Reports that he is feeling a little bit better, reports poor oral intake, denies any significant nausea or injured this morning, no vomiting.  Denies any shortness of breath, chest pain, palpitations.  Discussed with the patient the importance of his bowel movements, which she is aware.  Also discussed with the patient importance of body/posture awareness, which he is working with occupational therapy and physical therapy.  Patient had no additional questions today.    OBJECTIVE/ Physical Exam:     Vitals:    03/07/19 0028 03/07/19 0643 03/07/19 0818 03/07/19 0900   BP: 131/76 (!) 153/84 (!) 141/89 135/79   BP Source: Arm, Right Upper Arm, Right Upper Arm, Right Upper    Pulse: 73 76 84 81   Temp: 36.8 ???C (98.2 ???F) 36.6 ???C (97.8 ???F)     SpO2: 95% 99%     Weight:  69.7 kg (153  lb 10.6 oz)     Height:         Vitals:    02/25/19 1500 02/28/19 0500 03/07/19 0643   Weight: 72.1 kg (158 lb 15.2 oz) 70 kg (154 lb 5.2 oz) 69.7 kg (153 lb 10.6 oz)       Upon physical examination the patient was sitting in the mattress in the gym.  He was working with occupational therapy.  He had trunk instability, he was not from the mirror and could self correct his posture when he realized he was leaning towards one side but had noticeable instability when asked to stretch his hand in front of his body.  He had no significant tremors.which is an improvement compared to the past.  No significant nystagmus observed.  No observed labored breathing, no abdominal distention, abdominal binder in place.  Bilateral TED hose in place.  No significant lower extremity edema.    RESULTS AND DATA REVIEWED:      Current Medication List:  Scheduled Meds:aspirin EC tablet 81 mg, 81 mg, Oral, QAM8  atorvastatin (LIPITOR) tablet 80 mg, 80 mg, Oral, QHS  baclofen (LIORESAL) tablet 2.5 mg, 2.5 mg, Oral, TID  Bimatoprost (Lumigan) 0.01 % drop 1 drop ++patient own med++, 1 drop, Both Eyes, QDAY(21)  citalopram (CeleXA) tablet 20 mg, 20 mg, Oral, QAM8  clopiDOGrel (PLAVIX) tablet 75 mg, 75 mg, Oral, QDAY  docusate (COLACE) capsule 100 mg, 100 mg, Oral, BID  enoxaparin (LOVENOX) syringe 40 mg, 40 mg, Subcutaneous, QDAY insulin aspart U-100 (NOVOLOG FLEXPEN) injection PEN 0-12 Units, 0-12 Units, Subcutaneous, ACHS (22)  insulin glargine (LANTUS SOLOSTAR) injection PEN 5 Units, 5 Units, Subcutaneous, QHS(22)  lactulose oral solution 20 g, 20 g, Oral, TID w/ meals  melatonin tablet 3 mg, 3 mg, Oral, QHS  midodrine (PROAMATINE) tablet 5 mg, 5 mg, Oral, TID  ondansetron (ZOFRAN ODT) rapid dissolve tablet 4 mg, 4 mg, Oral, ACHS  pantoprazole DR (PROTONIX) tablet 20 mg, 20 mg, Oral, QDAY  polyethylene glycol 3350 (MIRALAX) packet 17 g, 1 packet, Oral, QDAY  senna (SENOKOT) tablet 2 tablet, 2 tablet, Oral, QHS  sodium chloride PF 0.9% flush 3-5 mL, 3-5 mL, Flush, FLUSH TID    Continuous Infusions:  PRN and Respiratory Meds:acetaminophen Q4H PRN, aluminum/magnesium hydroxide Q4H PRN, bisacodyL QDAY PRN, magnesium citrate QDAY PRN, milk of magnesia (CONC) Q4H PRN, traZODone QHS PRN    Last Bowel Movement Date: 03/06/19 (03/07/2019 10:30 AM)    Post-Void(PVR) Bladder Scan (mL): 151 milliliters (02/28/2019  8:52 PM)    Straight Cath (mL): 320 (03/04/2019  7:17 PM)    Results for orders placed or performed during the hospital encounter of 02/23/19 (from the past 24 hour(s))   POC GLUCOSE    Collection Time: 03/06/19  4:43 PM   # # Low-High    Glucose, POC 147 (H) 70 - 100 MG/DL   POC GLUCOSE    Collection Time: 03/06/19 10:13 PM   # # Low-High    Glucose, POC 136 (H) 70 - 100 MG/DL   POC GLUCOSE    Collection Time: 03/07/19  6:49 AM   # # Low-High    Glucose, POC 79 70 - 100 MG/DL   CBC CELLULAR THERAPEUTICS    Collection Time: 03/07/19  7:25 AM   # # Margarito Liner    White Blood Cells 6.8 4.5 - 11.0 K/UL    RBC 4.18 (L) 4.4 - 5.5 M/UL    Hemoglobin 12.6 (L) 13.5 - 16.5 GM/DL  Hematocrit 37.4 (L) 40 - 50 %    MCV 89.5 80 - 100 FL    MCH 30.2 26 - 34 PG    MCHC 33.8 32.0 - 36.0 G/DL    RDW 16.1 11 - 15 %    Platelet Count 148 (L) 150 - 400 K/UL    MPV 7.5 7 - 11 FL   BASIC METABOLIC PANEL CELLULAR THERAPEUTICS    Collection Time: 03/07/19  7:25 AM # # Low-High    Sodium 139 137 - 147 MMOL/L    Potassium 3.4 (L) 3.5 - 5.1 MMOL/L    Chloride 103 98 - 110 MMOL/L    CO2 31 (H) 21 - 30 MMOL/L    Anion Gap 5 3 - 12    Glucose 97 70 - 100 MG/DL    Blood Urea Nitrogen 13 7 - 25 MG/DL    Creatinine 0.96 0.4 - 1.24 MG/DL    Calcium 8.6 8.5 - 04.5 MG/DL    eGFR Non African American >60 >60 mL/min    eGFR African American >60 >60 mL/min   POC GLUCOSE    Collection Time: 03/07/19 11:56 AM   # # Low-High    Glucose, POC 186 (H) 70 - 100 MG/DL     Chart reviewed, nursing notes and therapy notes reviewed.   Case discussed with the attending physician.     Gareth Morgan, M.D.   Physical Medicine and Rehabilitation (PM&R)   Resident Physician, PGY-2   Pager: [phone]  03/07/2019   12:23 PM

## 2019-03-08 DIAGNOSIS — I69322 Dysarthria following cerebral infarction: Principal | ICD-10-CM

## 2019-03-08 LAB — POC GLUCOSE
Lab: 186 mg/dL — ABNORMAL HIGH (ref 70–100)
Lab: 87 mg/dL (ref 70–100)
Lab: 184 mg/dL — ABNORMAL HIGH (ref 70–100)
Lab: 235 mg/dL — ABNORMAL HIGH (ref 70–100)

## 2019-03-08 LAB — CBC AND DIFF: Lab: 6.3 10*3/uL (ref 4.5–11.0)

## 2019-03-08 LAB — COMPREHENSIVE METABOLIC PANEL
Lab: 133 MMOL/L — ABNORMAL LOW (ref 137–147)
Lab: 3.6 MMOL/L — ABNORMAL LOW (ref 3.5–5.1)

## 2019-03-08 MED ORDER — SODIUM CHLORIDE 0.9 % IV SOLP
1000 mL | INTRAVENOUS | 0 refills | Status: CP
Start: 2019-03-08 — End: ?
  Administered 2019-03-08: 20:00:00 1000 mL via INTRAVENOUS

## 2019-03-08 NOTE — Case Management (ED)
Case Management Progress Note    NAME:Mitchell Lucero                          MRN: [id number]              DOB:1951/01/07          AGE: 68 y.o.  ADMISSION DATE: 02/23/2019             DAYS ADMITTED: LOS: 13 days      Todays Date: 03/08/2019    Plan  Anticipated discharge to home with consistent supervision and physical assistance on 9/1. Pt recommended for ongoing RN/PT/OT/ST.    Interventions  ? Support   Support: Pt/Family Updates re:POC or DC Plan   SW updated wife [name] regarding team conference. [name] in agreement with plans for dc on 9/1 stating she took off on Monday 8/31 and can be here all day for training. [name] inquired about her adult children coming for training on the same day. SW educated on need for approval and they may need to come a different day due to Covid 19 restrictions. [name] verbalized understanding, SW will update [name] after inquiring.    [name] aware pt needs more help in am and stays the night each night, therefore aware training will start early near 7:15am on Monday.  ? Info or Referral   Information or Referral to Community Resources: No Needs Identified  ? Discharge Planning   Discharge Planning: [place] sent referral to [facility] for ongoing RN/PT/OT/ST.    ? Medication Needs   Medication Needs: No Needs Identified     ? Financial   Financial: No Needs Identified  ? Legal   Legal: No Needs Identified  ? Other        Disposition  ? Expected Discharge Date    Expected Discharge Date: 03/15/19  ? Transportation      ? Next Level of Care (Acute Psych discharges only)      ? Discharge Disposition        Durable Medical Equipment      No service has been selected for the patient.      [facility]      No service has been selected for the patient.      [facility]      No service has been selected for the patient.      [facility]      No service has been selected for the patient.        Oletha Blend, [place]  [phone]  p

## 2019-03-08 NOTE — Progress Notes
PHYSICAL THERAPY     03/08/19 0910   Cognitive   Orientation Oriented x4   Patient Behavior Calm;Cooperative;Flat affect   Cognition Follows Commands   Subjective   Subjective Pt was asleep upon arrival. Per ST, he was very lethargic this morning. He had not eaten yet & his tray was on the counter.    Bed Mobility   Bed Mobility: Rolling Right Moderate assistance;Assist with Left lower extremity   Bed Mobility: Rolling Left Moderate assistance;Assist with RLE   Bed Mobility: Supine to sit assist level Maximum assistance   Bed Mobility: Supine to sit type of assist Assist with trunk   Bed Mobility Comments Pt is retropulsive initially in sitting due to extensor tone throughout the trunk & LEs. Upon sitting up, he also had a small amount of emesis. Of note, he had Zofran at 6 a.m.    Transfers   Transfer: Assistive Device None   Transfer: Sit to stand assist level Moderate assistance   Transfer: Sit to stand type of assist For safety   Transfer: Stand to sit assist level Minimum assistance   Transfer: Stand to sit type of assist For safety;Requires cues for positioning of hands   Transfer: Stand pivot assist level Moderate assistance   Transfer: Stand pivot type of assist For safety;Facilitation of weight shift;Requires verbal cues for sequencing;Requires cues for positioning of hands;Requires cues for positioning of feet   Daily Care   Patient continent of bladder? No   Urine Occurrence 1   Accident - Bladder Large   Urine Output Source Diaper/Brief  (& chuck)   Urinary Catheter / Perineal Care Assist   Activity   PT Therapeutic Activities Performed clean up at bed level for bladder accident; pt was able to bridge with assist for LEs to achieve pants over hips. He required total assist for lowed body dressing & was able to sit with assist for upper body dressing. He was initially agreeable to 15 min of PT & then eating. However, upon getting up to the wheelchair & down to the gym, he reported nausea & dizziness. Vitals were assessed & found to be WNL (BP 128/72, O2 96% on RA). He was taken back to his room & was agreeable to eating. Therapy tech provided meal assist.    Assessment   Assessment Patient was not feeling well this morning, appears lethargic and somber. He is still having emesis with previously administer Zofran. He may benefit from having baclofen scheduled eariler to make bed mobility easier at home.    Recommendations   PT Discharge Recommendations Home with Assistance;Home Health Setting   Equipment Recommendations Wheelchair - Manual;Wheelchair cushion  (mechanical sit to stand lift)   Expected Discharge Date 03/15/19     Doretha Sou, PT, DPT

## 2019-03-08 NOTE — Progress Notes
SPEECH-LANGUAGE PATHOLOGY  REHAB SPEECH DAILY NOTE       SUMMARY: Patient was in bed, more lethargic, disoriented with increased slurring of his speech.  Patient stated "I'm not feeling well". Informed the RN who was aware and the resident.  Session was discontinued.    45 min of therapy missed due to questionable change in status. This service will try to make up lost time if appropriate.    PLAN: Continue current therapy plan.    FREQUENCY TODAY: 1x Patient was not seen for a full session    Therapist: Shanda Howells, MAL/CCC/SLP  Date: 03/08/2019

## 2019-03-08 NOTE — Progress Notes
PHYSICAL THERAPY  DME/PROGRESS NOTE      Subjective  Mitchell Lucero is a 68 y.o. male with a history of left pontine stroke, back surgery, hepatic mass, HTN, DM, who experienced an acute cerebellar stroke during his inpatient rehabilitation stay. He was transferred to the hospital and then back to rehab as an interrupted stay.     ROM  UE ROM: limited shoulder extension, flexion, & abduction at end range due to increased tone  LE ROM: limited flexion globally 2/2 increased tone, wears R AFO for tight heel cord     Strength  UE strength: 2+/5 in available range, decreased grip in RUE>LLE  LE strength: hip flexion 2+/5, knee flexion/extension grossly 4/5, dorsiflexion/plantarflexion grossly 4+/5    Posture/Neurological  R hemiplegia; pusher syndrome   Extensor tone affects entire R side including trunk   He sits posteriorly until he performs forward flexion to stretch & release the tone    Bed Mobility/Transfer  Bed mobility supine to/from sit: maximal assist, moderate assist for rolling  Stand pivot transfer: maximal assist (increased time for stepping, verbal cues for foot placement, hand placement & assist for weight shift).     Balance  Sitting balance is initially maximal assist but progresses to SBA. He then fatigues in unsupported sitting & requires minimal assist to avoid posterior trunk lean.     Gait  Not a safe, functional ambulator. Patient has ataxia, bilat LE scissoring and pushes right with gait trials.     Wheelchair Mobility  SBA with increased time, cues on firm, level terrain.    Assessment/Progress  The patient is making progress, though limited by nausea & lethargy.  He is able to propel a K5 manual wheelchair SBA with modifications for grip, forward axle & seat bucketing. He follows commands and actively participates in transfers & compensatory strategies for midline orientation during transfers and unsupported sitting. His transfers remain very skilled and maximal assist due to his deficits. He continues to be limited by R sided hemiplegia, R side pushing and hypertonicity.     The patient will require a sit to stand lift for transfers. The lift will be utilized for bed to/from wheelchair, wheelchair to/from toilet and for hygiene after toileting. Without the lift, the patient would be bed confined or a two person assist. The patient has been successfully using a lift with nursing staff during his stay and his wife has trialed the lift successfully as well.     Goals  Moderate assist at wheelchair level for household mobility    Plan  Home with family assist;  home health PT/OT/ST    PT Discharge Recommendations  Sit to stand lift, K5 manual wheelchair & roho hybrid cushion    Therapist: Doretha Sou, PT, DPT  Date: 03/08/2019

## 2019-03-08 NOTE — Rehab Team Conference
Team Conference Note     Date of Admission:  02/23/2019  Date of Team Conference:  03/08/2019   Mitchell Lucero is a 68 y.o. male.     DOB: 11-Apr-1951                  MRN#:  [id number]    Team Conference   Attendees: Odie Sera, Attending Physician; Vinicius Jenetta Loges MD, Resident Physician; Hilarie Fredrickson SW, Social Work; Randel Pigg RN, Nurse Case Manager; Michaelyn Barter RN, Rehabilitation Program Coordinator; Mellody Dance PhD, ABPP, Neuropsychology; Henrene Dodge OTR, Occupational Therapy; Athena Masse DPT, Physical Therapy; Herbie Baltimore SLP, Speech Therapy; Inocencio Homes RD LD, Clinical Nutrition; Juanetta Snow PharmD, Pharmacy; Vergia Alcon, Social Work Student; Mariana Single RN, Nursing    Medical Update: 68 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 Medical barriers include: persistent nausea, emesis, dizziness and poor balance/trunk instability secondary to cerebellar stroke.   Poor appetite. Needs to have 3-4 BMs daily.  On 8/25 was more lethargic in the AM, no BM day before. Increase neurochecks q2h, new labs, IVF Bolus  Keep low threashold CT/MBRI   He will need follow up with Neurology outpatient and with PM&R physician, Hepatology and Liver Biopsy    NeuroPsych: Poor initiation.     Team Goal:  Patient will perform: household mobility, at wheelchair level, Moderate assistance, With caregiver assistance, Progressing     Pt will perform basic care and transfer with: Maximum assistance, Wheelchair level     Discharge Planning     Discharge Date:  03/15/19     SW spoke with pt's wife [name] and she stated she feels she can provide adequate care to pt at home. SW discussed hiring caregiver and she stated she knows someone from church she is going to reach out to for assistance.  [name] verbalized she is taking off at least a week initially and believes pt will sit on couch with his mom and not do much while she is not there until she returns around noon. She wants to hire someone to help for a couple of hours a day.  [name] explained her biggest concern is getting in him in/out of the vehicle. SW educated on therapy car transfer training and she plans to speak to the PT.  [name] would still like to use [facility] for ongoing services.  [name] inquired about dme and SW advised her to speak with therapy. Team will order wc and this will be delivered prior to his dc.  Per discussion with OT Corrie Dandy she has talked to her about the sara steady and showed her where to purchase this on [place].    Wife [name] works, however can provide intermittent assist at home. Pt will stay with his mom during the day who is physically disabled, however can assist with supervision needs and medication management. Wife [name] is trained on diabetic medications and likely requires no further contact with DM education unless medications have changed since last admit. Pt owns rw and spc.    Type of Residence: Home, independent  Living Arrangements: Spouse/significant other, Family members(w/ spouse and mother )  Financial risk analyst / Tub: Psychologist, counselling  How many levels in the residence?: 2  Can patient live on one level if needed?: Yes  Does residence have entry and/or side stairs?: No  Assistance needed prior to admit or anticipated on discharge: Yes  Who provides assistance or  could if needed?: pt's spouse and HH   Are they in good health?: Yes  Can support system provide 24/7 care if needed?: No(pt's spouse works during the day )  ??? Level of Function   Prior level of function: Needs assist with ADLs  Which ADLs require assistance?: Mobility, medication management, housekeeping, meal prep, transport  Who assists with ADLs?: pt's wife and [facility]     Plan Home with assistance;  home health OT/PT/ST    DME May need to perform sponge baths at home, at least initially, due to need for compression when out of bed, manual wheelchair (K5), cushion & sit to stand lift    Rehabilitation Plan     Progress  Able to perform skilled stand pivot with max Ax1  Wife is very supportive & involved in his care; will have 24/7 family support  Progressed UB dressing from maximum to moderate assist while in supported sitting (WC).  Good participation  Speech intelligibility grossly WFL's with known topic, when not fatigued. Fair recall of speech strategies.    Barriers/Concerns  Max x1 for stand pivot transfers  Moderate assist for bed mobility  Not a safe, functional ambulator- pursuing K5 manual wheelchair  Emesis with mobility even with Zofran; will defer PM therapy to after 1:30  Needs further family training (hands on car transfer practice)  High burden of care - requiring total assist for LB dressing and toileting.  Showering has not been addressed this admission due to need for compression when out of bed to prevent orthostatic hypotension.  Requires two person assist for placing in hydraulic sit to stand lift from edge of bed.   Patient presents with severe cognitive impairment characterized by poor initiation, memory, problem solving and attention skills.   Patient with reduced new learning  Patient will required 24/7 supervision and assist with all IADL's.  Generalized weakness, inability to indepedently express self with needs. Requiring quing with care.    Plan  Mobility with compression (SBP goal >120), safety with transfers, bed mobility, wheelchair mobility, trial Nustep, gait at rail as pt tolerates.   Sitting balance, both static and dynamic, functional transfers, UB/LB dressing, eating, and grooming; functional cognition, family training.    denies pain, Skin intact, position change to prevent skin breakdown. Wife rooming and called with needs.  Move Baclofen dose to 6am   Up to chair for 3/3 meals with supervision and cueing  Sit at sink for grooming in evening with staff Utilize sit to stand lift for transfers  Family training with wife (car transfer, medications)    Goals    Weekly Goals  Patient will perform sit to supine with: Minimum assistance, Progressing  Patient will perform supine to sit with: Minimum assistance, Progressing  Patient will complete sit to stand transfer with: Moderate assistance, Achieved  Patient will complete stand to sit transfer with: Moderate assistance, Achieved  Patient will complete stand pivot transfer with: Moderate assistance, Progressing  Patient will ambulate: 50 feet, Moderate assistance, Progressing  Patient will ascend/descend: (discontinued)  Weekly Wheelchair Goals: Patient will propel manual wheelchair on level surfaces with  Patient will propel manual wheelchair on level surfaces with: Stand by assistance, Achieved  Weekly Goals  Patient Will Perform Bathing: w/ Moderate Assist  Patient Will Perform UE Dressing: w/ Moderate Assist  Patient Will Perform LE Dressing: w/ Moderate Assist  Patient Will Perform All ADL's: w/ Stand By Assist  Patient Will Perform Grooming: w/ Stand By Assist, in Chair  Patient Will  Perform Toileting: w/ Moderate Assist  Pt Will Perform All Functional Transfers: Moderate Assist  Pt Will Transfer To Shower: w/ Moderate Assist   Eating FIM: 5 - Verbal cues, coaxing  Grooming FIM: 5 - Set up  Bathing FIM: 1 - Total assistance, patient performs less than 25% of grooming/bathing/dressing/toileting tasks  Dressing - Upper Body FIM: 2 - Maximal assistance, patient performs 25-49% of grooming/bathing/dressing/toileting tasks  Dressing - Lower Body FIM: 1 - Total assistance, patient performs less than 25% of grooming/bathing/dressing/toileting tasks  Toileting FIM: 1 - Total assistance, requires 2 staff to assist  Bladder FIM: 1 - Total assistance, patient expends less than 25% effort (complete % of assist, if change diapers, total assist)  Bowel FIM: 6 - Modified independence - Medication Transfers FIM: 2 - Maximal assistance, patient performs 25-49% of transferring tasks  Toilet Transfers FIM: 1 - Total assistance, two or more people  Shower Transfers FIM: 1 - Total assistance, two or more people  Gait: 1 - Toal assistance, two or more people, < 50 feet  Wheelchair FIM: 3 - Moderate assistance, patient expends 50-74% effort, greater than or equal to 150 feet  Stairs FIM: 0 - Activity did not occur - Safety  Comprehension FIM: 4 - Minimal prompting. Understands directions/conversation about basic daily needs 75-90%  Expression FIM: 4 - Minimal prompting - Expresses directions/conversation about basic daily needs 75-90%  Social Interaction FIM: 5 - Supervision required - Able to interact appropriately >90% of the time, prompting <10%  Problem Solving FIM: 3 - Moderate prompting - Able to solve routine problems 50-74%. Needs direction less than half of the time to initiate, plan or complete daily activities  Memory FIM: 3 - Moderate promting - Able to recognize people frequently encountered, remembers daily routines, responds to requests of others 50-74% of the time, needs prompting less than half of the time

## 2019-03-09 ENCOUNTER — Observation Stay: Admit: 2019-02-22 | Discharge: 2019-02-22

## 2019-03-09 ENCOUNTER — Emergency Department: Admit: 2019-03-09 | Discharge: 2019-03-09

## 2019-03-09 ENCOUNTER — Encounter: Admit: 2019-03-09 | Discharge: 2019-03-09

## 2019-03-09 ENCOUNTER — Emergency Department: Admit: 2019-02-21 | Discharge: 2019-02-21

## 2019-03-09 ENCOUNTER — Ambulatory Visit: Admit: 2019-03-09 | Discharge: 2019-03-16 | Disposition: A | Discharge status: Hospice

## 2019-03-09 ENCOUNTER — Inpatient Hospital Stay: Admit: 2019-02-23 | Discharge: 2019-03-09

## 2019-03-09 DIAGNOSIS — Z794 Long term (current) use of insulin: Secondary | ICD-10-CM

## 2019-03-09 DIAGNOSIS — Z1159 Encounter for screening for other viral diseases: Secondary | ICD-10-CM

## 2019-03-09 DIAGNOSIS — I672 Cerebral atherosclerosis: Secondary | ICD-10-CM

## 2019-03-09 DIAGNOSIS — R270 Ataxia, unspecified: Secondary | ICD-10-CM

## 2019-03-09 DIAGNOSIS — I69322 Dysarthria following cerebral infarction: Principal | ICD-10-CM

## 2019-03-09 DIAGNOSIS — I639 Cerebral infarction, unspecified: Secondary | ICD-10-CM

## 2019-03-09 DIAGNOSIS — R188 Other ascites: Secondary | ICD-10-CM

## 2019-03-09 DIAGNOSIS — Z0489 Encounter for examination and observation for other specified reasons: Secondary | ICD-10-CM

## 2019-03-09 DIAGNOSIS — K7290 Hepatic failure, unspecified without coma: Secondary | ICD-10-CM

## 2019-03-09 DIAGNOSIS — I69393 Ataxia following cerebral infarction: Secondary | ICD-10-CM

## 2019-03-09 DIAGNOSIS — R531 Weakness: Secondary | ICD-10-CM

## 2019-03-09 DIAGNOSIS — I6521 Occlusion and stenosis of right carotid artery: Secondary | ICD-10-CM

## 2019-03-09 DIAGNOSIS — R471 Dysarthria and anarthria: Secondary | ICD-10-CM

## 2019-03-09 DIAGNOSIS — K766 Portal hypertension: Secondary | ICD-10-CM

## 2019-03-09 DIAGNOSIS — G464 Cerebellar stroke syndrome: Secondary | ICD-10-CM

## 2019-03-09 DIAGNOSIS — I69351 Hemiplegia and hemiparesis following cerebral infarction affecting right dominant side: Secondary | ICD-10-CM

## 2019-03-09 DIAGNOSIS — D696 Thrombocytopenia, unspecified: Secondary | ICD-10-CM

## 2019-03-09 DIAGNOSIS — Z7982 Long term (current) use of aspirin: Secondary | ICD-10-CM

## 2019-03-09 DIAGNOSIS — C220 Liver cell carcinoma: Secondary | ICD-10-CM

## 2019-03-09 DIAGNOSIS — K7460 Unspecified cirrhosis of liver: Secondary | ICD-10-CM

## 2019-03-09 DIAGNOSIS — Z87891 Personal history of nicotine dependence: Secondary | ICD-10-CM

## 2019-03-09 DIAGNOSIS — I951 Orthostatic hypotension: Secondary | ICD-10-CM

## 2019-03-09 DIAGNOSIS — G9340 Encephalopathy, unspecified: Secondary | ICD-10-CM

## 2019-03-09 DIAGNOSIS — G20 Parkinson's disease: Secondary | ICD-10-CM

## 2019-03-09 DIAGNOSIS — Z7902 Long term (current) use of antithrombotics/antiplatelets: Secondary | ICD-10-CM

## 2019-03-09 DIAGNOSIS — I10 Essential (primary) hypertension: Secondary | ICD-10-CM

## 2019-03-09 DIAGNOSIS — E119 Type 2 diabetes mellitus without complications: Secondary | ICD-10-CM

## 2019-03-09 DIAGNOSIS — E785 Hyperlipidemia, unspecified: Secondary | ICD-10-CM

## 2019-03-09 DIAGNOSIS — I663 Occlusion and stenosis of cerebellar arteries: Secondary | ICD-10-CM

## 2019-03-09 DIAGNOSIS — R5381 Other malaise: Secondary | ICD-10-CM

## 2019-03-09 DIAGNOSIS — R251 Tremor, unspecified: Secondary | ICD-10-CM

## 2019-03-09 DIAGNOSIS — Z7189 Other specified counseling: Secondary | ICD-10-CM

## 2019-03-09 DIAGNOSIS — I2510 Atherosclerotic heart disease of native coronary artery without angina pectoris: Secondary | ICD-10-CM

## 2019-03-09 LAB — URINALYSIS MICROSCOPIC REFLEX TO CULTURE

## 2019-03-09 LAB — POC GLUCOSE
Lab: 184 mg/dL — ABNORMAL HIGH (ref 70–100)
Lab: 124 mg/dL — ABNORMAL HIGH (ref 70–100)
Lab: 168 mg/dL — ABNORMAL HIGH (ref 70–100)
Lab: 162 mg/dL — ABNORMAL HIGH (ref 70–100)

## 2019-03-09 LAB — CBC CELLULAR THERAPEUTICS: Lab: 7.2 K/UL — ABNORMAL LOW (ref 4.5–11.0)

## 2019-03-09 LAB — URINALYSIS DIPSTICK REFLEX TO CULTURE

## 2019-03-09 LAB — BASIC METABOLIC PANEL CELLULAR THERAPEUTICS: Lab: 136 MMOL/L — ABNORMAL LOW (ref >60)

## 2019-03-09 MED ORDER — TRAZODONE 50 MG PO TAB
50 mg | Freq: Every evening | ORAL | 0 refills | 30.00000 days | Status: DC | PRN
Start: 2019-03-09 — End: 2019-03-16

## 2019-03-09 MED ORDER — ONDANSETRON 4 MG PO TBDI
4 mg | ORAL_TABLET | Freq: Before meals | ORAL | 0 refills | 8.00000 days | Status: AC
Start: 2019-03-09 — End: ?

## 2019-03-09 MED ORDER — BIMATOPROST 0.01 % OP DROP
1 [drp] | OPHTHALMIC | 0 refills | 30.00000 days | Status: AC | PRN
Start: 2019-03-09 — End: ?

## 2019-03-09 MED ORDER — MAGNESIUM HYDROXIDE 2,400 MG/10 ML PO SUSP
10 mL | ORAL | 0 refills | 2.00000 days | Status: DC | PRN
Start: 2019-03-09 — End: 2019-03-16

## 2019-03-09 MED ORDER — ALUMINUM-MAGNESIUM HYDROXIDE 200-200 MG/5 ML PO SUSP
30 mL | ORAL | 0 refills | 2.00000 days | Status: DC | PRN
Start: 2019-03-09 — End: 2019-03-16

## 2019-03-09 MED ORDER — BACLOFEN 10 MG PO TAB
2.5 mg | ORAL_TABLET | Freq: Three times a day (TID) | ORAL | 0 refills | 30.00000 days | Status: DC
Start: 2019-03-09 — End: 2019-03-16

## 2019-03-09 MED ORDER — LACTULOSE 10 GRAM/15 ML PO SOLN WRAPPER
20 g | Freq: Three times a day (TID) | ORAL | 0 refills | 21.00000 days | Status: AC
Start: 2019-03-09 — End: ?

## 2019-03-09 MED ORDER — ENOXAPARIN 40 MG/0.4 ML SC SYRG
40 mg | Freq: Every day | SUBCUTANEOUS | 0 refills | 7.00000 days | Status: DC
Start: 2019-03-09 — End: 2019-03-16

## 2019-03-09 MED ORDER — ACETAMINOPHEN 325 MG PO TAB
650 mg | ORAL | 0 refills | Status: AC | PRN
Start: 2019-03-09 — End: ?

## 2019-03-09 MED ORDER — BISACODYL 10 MG RE SUPP
10 mg | Freq: Every day | RECTAL | 0 refills | 1.00000 days | Status: AC | PRN
Start: 2019-03-09 — End: ?

## 2019-03-09 MED ORDER — SENNOSIDES 8.6 MG PO TAB
2 | ORAL_TABLET | Freq: Every evening | ORAL | 0 refills | 30.00000 days | Status: DC
Start: 2019-03-09 — End: 2019-03-16

## 2019-03-09 MED ORDER — ASPIRIN 81 MG PO TBEC
81 mg | ORAL_TABLET | Freq: Every morning | ORAL | 0 refills | Status: AC
Start: 2019-03-09 — End: ?

## 2019-03-09 MED ORDER — INSULIN ASPART 100 UNIT/ML SC FLEXPEN
0-12 [IU] | Freq: Before meals | SUBCUTANEOUS | 0 refills | 30.00000 days | Status: DC
Start: 2019-03-09 — End: 2019-03-16

## 2019-03-09 MED ORDER — MIDODRINE 5 MG PO TAB
5 mg | Freq: Three times a day (TID) | ORAL | 0 refills | Status: AC
Start: 2019-03-09 — End: ?

## 2019-03-09 MED ORDER — LACTULOSE 10 GRAM/15 ML PO SOLN WRAPPER
30 mL | Freq: Once | ORAL | 0 refills | Status: CP
Start: 2019-03-09 — End: ?
  Administered 2019-03-10: 10:00:00 20 g via ORAL

## 2019-03-09 MED ORDER — DOCUSATE SODIUM 100 MG PO CAP
100 mg | ORAL_CAPSULE | Freq: Two times a day (BID) | ORAL | 0 refills | 30.00000 days | Status: DC
Start: 2019-03-09 — End: 2019-03-16

## 2019-03-09 MED ORDER — INSULIN GLARGINE 100 UNIT/ML (3 ML) SC INJ PEN
5 [IU] | Freq: Every evening | SUBCUTANEOUS | 0 refills | 68.00000 days | Status: AC
Start: 2019-03-09 — End: ?

## 2019-03-09 MED ORDER — MAGNESIUM CITRATE PO SOLN
296 mL | Freq: Every day | ORAL | 0 refills | Status: DC | PRN
Start: 2019-03-09 — End: 2019-03-16

## 2019-03-09 MED ORDER — POLYETHYLENE GLYCOL 3350 17 GRAM PO PWPK
17 g | Freq: Every day | ORAL | 0 refills | 22.00000 days | Status: DC
Start: 2019-03-09 — End: 2019-03-16

## 2019-03-09 NOTE — Case Management (ED)
Case Management Progress Note    NAME:Lynx ABRHAM CLEMENCE                          MRN: [id number]              DOB:1950/09/12          AGE: 68 y.o.  ADMISSION DATE: 02/23/2019             DAYS ADMITTED: LOS: 14 days      Todays Date: 03/09/2019    Plan  Anticipated discharge to home with consistent supervision and physical assistance on 9/1. Pt recommended for ongoing RN/PT/OT/ST.    Interventions  ? Support   Support: Pt/Family Updates re:POC or DC Plan     SW spoke with pt's wife [name] to update it was approved for her son Thurmond Butts to also be on the unit with her on Monday 8/31 for training.    ? Info or Referral   Information or Referral to Community Resources: No Needs Identified  ? Discharge Planning   Discharge Planning: Home Health  ? Medication Needs   Medication Needs: No Needs Identified        ? Financial   Financial: No Needs Identified  ? Legal   Legal: No Needs Identified  ? Other        Disposition  ? Expected Discharge Date    Expected Discharge Date: 03/15/19  ? Transportation      ? Next Level of Care (Acute Psych discharges only)      ? Discharge Disposition         Durable Medical Equipment      No service has been selected for the patient.      [place] [facility]      No service has been selected for the patient.      [place]      No service has been selected for the patient.      [facility]      No service has been selected for the patient.        Oletha Blend, [place]

## 2019-03-09 NOTE — Progress Notes
OCCUPATIONAL THERAPY     03/08/19 1300   Cognitive   Patient Behavior Calm;Cooperative   Cognition Follows Commands   Subjective   Subjective Patient was agreeable to therapy session.     Bed Mobility   Bed Mobility: Supine to sit assist level Maximum assistance   Bed Mobility: Supine to sit type of assist Assist with Bilateral lower extremities;Assist with trunk   Transfers   Transfer: Assistive Device None   Transfer: Sit to stand assist level Maximum assistance   Transfer: Sit to stand type of assist Facilitation of hip extension;Facilitation of trunk;Facilitation of weight shift   Transfer: Stand to sit assist level Moderate assistance   Transfer Comment Performing several sit <> stand transfers.    Postural Control   Balance: Sitting Static Poor +  (Seated edge of bed with bed in max inflate.)   Body Alignment/Symmetry Other (Commenting)  (Using mirror for midline orientation.  )   Activity   Therapeutic Activities OT addressing static sitting balance on edge of bed.  Continues to demonstrate intermittent retropulsion or lateral leans to either side.  Use of mirror assists with midline orientation.      Assessment   Assessment Patient continues to demonstrate difficulty with unsupported sitting balance when seated edge of bed.     Plan   Plan Comments Sitting balance, both static and dynamic, functional transfers, UB/LB dressing, eating, and grooming; functional cognition, family training.      Molli Knock, OTR/L

## 2019-03-09 NOTE — Progress Notes
Patient was lethargic and needing more than usual prompts for orientation questions.  Patient was oriented to self, time, place, situation.  Informed Dr. Queen Blossom, RN will continue to monitor.

## 2019-03-09 NOTE — Progress Notes
Patient has left the unit at this time for MRI via [facility]LS

## 2019-03-09 NOTE — Progress Notes
SPEECH-LANGUAGE PATHOLOGY  REHAB SPEECH DAILY NOTE       SUMMARY: 30 min afternoon make-up session, from lost tx time yesterday, is medically excused.  Patient is off the floor getting an MRI.    PLAN: Continue current therapy plan.    FREQUENCY TODAY: 1x Patient was seen for one hour in the AM    Therapist: Shanda Howells, MAL/CCC/SLP  Date: 03/09/2019

## 2019-03-09 NOTE — Progress Notes
PHYSICAL THERAPY     03/09/19 1030   Assessment   Assessment Patient is medically excused this morning for stat MRI.    Missed Treatment   Missed Minutes 60   Reasons for Missed Minutes Medically excused     Doren Custard, PT, DPT

## 2019-03-09 NOTE — Progress Notes
Patient's peripheral IV discontinued due to clogging.

## 2019-03-09 NOTE — Progress Notes
Patient leaving unit for an MRI, spouse informed via telephone call.

## 2019-03-09 NOTE — Progress Notes
Rehabilitation Medicine Progress Note     Patient: Mitchell Lucero   [id number]   Date of Service: 03/09/2019  Date of Admission: 02/23/2019  Length of Stay: 14 days    Principal Problem:    Cerebellar stroke, acute (HCC)  Active Problems:    Orthostatic hypotension    HTN (hypertension)    Tremors of nervous system    T2DM (type 2 diabetes mellitus) (HCC)    History of neck surgery    Left pontine stroke (HCC)    TIA (transient ischemic attack)    Gait abnormality    Right hemiparesis (HCC)    Cirrhosis (HCC)    Hepatocellular carcinoma (HCC)    Dysarthria    Thrombocytopenia (HCC)    Occlusion of superior cerebellar artery    Impaired gait    Postural instability of trunk    Ataxia    Impaired mobility and ADLs    Acute ischemic stroke [facility])      ASSESSMENT & PLAN       Mitchell Lucero is a 68 y.o. male who is currently admitted to inpatient rehabilitation for Cerebellar stroke, acute (HCC).      Rehabilitation Plan  Rehabilitation: Patient will continue with comprehensive therapies including physical therapy, occupational therapy, speech & language pathology (as applicable), specialized rehab nursing, neuropsychology and physiatry oversight.    Goals: household mobility, at wheelchair level, Moderate assistance, With caregiver assistance, Progressing  Recommended therapy after discharge: Home with Assistance, Home Health Setting  Recommended equipment: Wheelchair - Manual, Wheelchair cushion(mechanical sit to stand lift)  Tentative discharge date: 03/15/19    Daily Functional Update:  Transfers        Transfer: Assistive Device: None (03/08/2019  1:00 PM)    Transfer: Sit to stand assist level: Maximum assistance (03/08/2019  1:00 PM)    Transfer: Stand pivot assist level: Maximum assistance (03/08/2019 10:30 AM)    No data recorded   Gait/ Mobility        Gait: Assistive Device: Rail (left, with mirror feedback) (03/02/2019  1:30 PM)    GAIT: Assist Level: Maximum assistance (03/02/2019  1:30 PM) Gait Distance: 5 feet (x2 ) (03/02/2019  1:30 PM)    Wheelchair: Distance: 25 feet (x2) (03/07/2019  1:00 PM)    Wheelchair: Assistance Level: Stand by assistance (03/07/2019  1:00 PM)     Toileting        Toileting Assist: Total Assist (03/03/2019  7:15 AM)    Toileting Equipment: Grab bar - left (02/28/2019 10:30 AM)    Toilet Transfer Assist: Maximum assistance;WIth two person (03/02/2019  9:30 AM)     Dressing LE Dressing Assist: Total Assist (2 person assist due to poor sitting balance.) (03/07/2019  9:00 AM)    UE Dressing Assist: Moderate Assist (03/07/2019  9:00 AM)         MEDICAL PROBLEMS:    Left pontine stroke (CVA), 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  -Goal long term BP from stroke perspective would be a little higher ideally around 110-140/70-90  ???             >???f/u OP with Mov Disorders neurology/requested   ?????????????????????             >???  continue ASA, Plavix, Statin for secondary stroke prevention               > BP management as below with some permissive hypertension to promote adequate perfusion  ???             >???consult OT, PT, SLP   > scheduled Zofran with meals   > started baclofen for central dizziness/nausea and increased tone but caution due to need to avoid hypotension and potential for sedation   >goals 2-3 BMs per day for hepatic encephalopathy  > worsening RUE ataxia and slurred speech, will get new MRI brain w/o (ordered 03/09/2019)  ???  CARDIO:  Hx of HLD, HTN  Orthostatic Hypotension  -TTE normal EF, moderate LA dilation, no thrombus/shunt.  ???????????????????????????????????????>???continue rec Abd Binder and Ted Hose  ???????????????????????????????????????>???continue Midodrine, now 5mg  TID to maintain perfusion brain  ???????????????????????????????????????>???dc PTA flomax and Acei ???????????????????????????????????????>???event monitor on d/c for further stroke risk factor assessment   ???????????????????????????????????????>???f/u cardio upon DC  ???  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  GI:  HCC, portal HTN/ascites, NASH  Hyperammonemia due to decompensated cirrhosis  -Abdominal US shows hepatic mass  -Abdominal CT shows mass c/w hepatocellular carcinoma, cirrhosis, portal HTN, small ascites  ???????????????????????????????????????>???f/u GI OP for biopsy after DC from rehab/ requested appt (hold Plavix for at least 5 days prior to biopsy)  ???????????????????????????????????????>???continue lactulose (3-4 BM daily) + mag citrate if not had 2 BMs daily minimum   ???  ENDO:  T2DM  ???????????????????????????????????????> PTA Levemir 10 units QHS and metformin 500 mg BID  ???????????????????????????????????????>???hold metformin, lantus 5 units QHS, LDCF  ???  Pain Management: pain is well controlled, heat / ice PRN, topical medication and Tylenol PRN  Skin: Encourage the patient to continue to inspect their skin and perform pressure relief.  Bowel: continent of bowel  Bladder: monitor bladder continence, BVIs x3 until <140ml and ISC for >383ml  Nutrition: Current diet:  diabetic  Mental Health: consult neuropsychology to provide support / counseling; continue citalopram  DVT ppx: enoxaparin (LOVENOX) syringe 40 mg  QDAY  Code Status: Full Code  DC Planning:   > will need follow up with Neurology outpatient and with PM&R physician, Hepatology and Liver Biops      03/09/19 Assessment and Changes in Care Plan  One BM yesterday, remains more lethargic with slurred speech and slow processing speed, RUE ataxia remains present. Will get new BRAIN MRI W/O. Medically excused from therapies.     SUBJECTIVE/ History:     No acute events overnight but slurred speech and slow processing speed this AM. Had 2x BM yesterday. Multiple staff members concerned regarding decline in mobility and function as well as slow to answer questions. Poor PO intake. No family at bedside.    OBJECTIVE/ Physical Exam:     Vitals: 03/08/19 1703 03/09/19 0022 03/09/19 0610 03/09/19 0941   BP: 124/80 124/74 127/73 (!) 114/96   BP Source: Arm, Right Upper Arm, Right Upper Arm, Right Upper Arm, Right Upper   Pulse: 79 80 86 87   Temp: 36.5 ???C (97.7 ???F) 36.7 ???C (98 ???F) 36.9 ???C (98.5 ???F) 36.6 ???C (97.8 ???F)   SpO2: 97% 94% 93% 94%   Weight:       Height:         Vitals:    02/25/19 1500 02/28/19 0500 03/07/19 0643   Weight: 72.1 kg (158  lb 15.2 oz) 70 kg (154 lb 5.2 oz) 69.7 kg (153 lb 10.6 oz)       GEN: sleepy,alert to self, lying in bed  HEENT: normocephalic  NECK: supple  CHEST: non-labored breathing  ABD: no visible distension  SKIN: no visible gross lesions   EXT: no significant LE edema, ted hose  PSYCH: mood and affect appropriate  NEURO: EOM conjugated and intact, still has RUE ataxia, but able to find my hand and squeeze, declining function with therapies with persistent dizziness and trunk instability     RESULTS AND DATA REVIEWED:      Current Medication List:  Scheduled Meds:aspirin EC tablet 81 mg, 81 mg, Oral, QAM8  atorvastatin (LIPITOR) tablet 80 mg, 80 mg, Oral, QHS  baclofen (LIORESAL) tablet 2.5 mg, 2.5 mg, Oral, TID  Bimatoprost (Lumigan) 0.01 % drop 1 drop ++patient own med++, 1 drop, Both Eyes, QDAY(21)  citalopram (CeleXA) tablet 20 mg, 20 mg, Oral, QAM8  clopiDOGrel (PLAVIX) tablet 75 mg, 75 mg, Oral, QDAY  docusate (COLACE) capsule 100 mg, 100 mg, Oral, BID  enoxaparin (LOVENOX) syringe 40 mg, 40 mg, Subcutaneous, QDAY  insulin aspart U-100 (NOVOLOG FLEXPEN) injection PEN 0-12 Units, 0-12 Units, Subcutaneous, ACHS (22)  insulin glargine (LANTUS SOLOSTAR) injection PEN 5 Units, 5 Units, Subcutaneous, QHS(22)  lactulose oral solution 20 g, 20 g, Oral, TID w/ meals  melatonin tablet 3 mg, 3 mg, Oral, QHS  midodrine (PROAMATINE) tablet 5 mg, 5 mg, Oral, TID  ondansetron (ZOFRAN ODT) rapid dissolve tablet 4 mg, 4 mg, Oral, ACHS  pantoprazole DR (PROTONIX) tablet 20 mg, 20 mg, Oral, QDAY polyethylene glycol 3350 (MIRALAX) packet 17 g, 1 packet, Oral, QDAY  senna (SENOKOT) tablet 2 tablet, 2 tablet, Oral, QHS  sodium chloride PF 0.9% flush 3-5 mL, 3-5 mL, Flush, FLUSH TID    Continuous Infusions:  PRN and Respiratory Meds:acetaminophen Q4H PRN, aluminum/magnesium hydroxide Q4H PRN, bisacodyL QDAY PRN, magnesium citrate QDAY PRN, milk of magnesia (CONC) Q4H PRN, traZODone QHS PRN    Last Bowel Movement Date: 03/08/19 (03/09/2019  9:23 AM)    Post-Void(PVR) Bladder Scan (mL): 151 milliliters (02/28/2019  8:52 PM)    Straight Cath (mL): 320 (03/04/2019  7:17 PM)    Results for orders placed or performed during the hospital encounter of 02/23/19 (from the past 24 hour(s))   CBC AND DIFF    Collection Time: 03/08/19  2:38 PM   # # Low-High    White Blood Cells 6.3 4.5 - 11.0 K/UL    RBC 4.04 (L) 4.4 - 5.5 M/UL    Hemoglobin 12.4 (L) 13.5 - 16.5 GM/DL    Hematocrit 19.1 (L) 40 - 50 %    MCV 89.6 80 - 100 FL    MCH 30.6 26 - 34 PG    MCHC 34.2 32.0 - 36.0 G/DL    RDW 47.8 11 - 15 %    Platelet Count 163 150 - 400 K/UL    MPV 7.4 7 - 11 FL    Neutrophils 54 41 - 77 %    Lymphocytes 34 24 - 44 %    Monocytes 8 4 - 12 %    Eosinophils 3 0 - 5 %    Basophils 1 0 - 2 %    Absolute Neutrophil Count 3.39 1.8 - 7.0 K/UL    Absolute Lymph Count 2.17 1.0 - 4.8 K/UL    Absolute Monocyte Count 0.48 0 - 0.80 K/UL    Absolute Eosinophil Count 0.20  0 - 0.45 K/UL    Absolute Basophil Count 0.06 0 - 0.20 K/UL   COMPREHENSIVE METABOLIC PANEL    Collection Time: 03/08/19  2:38 PM   # # Low-High    Sodium 133 (L) 137 - 147 MMOL/L    Potassium 3.6 3.5 - 5.1 MMOL/L    Chloride 98 98 - 110 MMOL/L    Glucose 231 (H) 70 - 100 MG/DL    Blood Urea Nitrogen 14 7 - 25 MG/DL    Creatinine 1.61 0.4 - 1.24 MG/DL    Calcium 8.6 8.5 - 09.6 MG/DL    Total Protein 6.1 6.0 - 8.0 G/DL    Total Bilirubin 1.6 (H) 0.3 - 1.2 MG/DL    Albumin 2.5 (L) 3.5 - 5.0 G/DL    Alk Phosphatase 045 (H) 25 - 110 U/L    AST (SGOT) 30 7 - 40 U/L CO2 26 21 - 30 MMOL/L    ALT (SGPT) 23 7 - 56 U/L    Anion Gap 9 3 - 12    eGFR Non African American >60 >60 mL/min    eGFR African American >60 >60 mL/min   POC GLUCOSE    Collection Time: 03/08/19  4:48 PM   # # Low-High    Glucose, POC 235 (H) 70 - 100 MG/DL   URINALYSIS DIPSTICK REFLEX TO CULTURE    Collection Time: 03/08/19  7:07 PM    Specimen: Urine   # # Low-High    Color,UA AMBER     Turbidity,UA CLEAR CLEAR-CLEAR    Specific Gravity-Urine 1.023 1.003 - 1.035    pH,UA 5.0 5.0 - 8.0    Protein,UA 1+ (A) NEG-NEG    Glucose,UA NEG NEG-NEG    Ketones,UA TRACE (A) NEG-NEG    Bilirubin,UA NEG NEG-NEG    Blood,UA NEG NEG-NEG    Urobilinogen,UA INCREASED (A) NORM-NORMAL    Nitrite,UA NEG NEG-NEG    Leukocytes,UA TRACE (A) NEG-NEG    Urine Ascorbic Acid, UA NEG NEG-NEG   URINALYSIS MICROSCOPIC REFLEX TO CULTURE    Collection Time: 03/08/19  7:07 PM    Specimen: Urine   # # Low-High    WBCs,UA 10-20 0 - 2 /HPF    RBCs,UA 0-2 0 - 3 /HPF    Comment,UA       Criteria for reflex to culture are WBC>10, Positive Nitrite, and/or >=+1   leukocytes. If quantity is not sufficient, an addendum will follow.      MucousUA 2+     Bacteria,UA FEW (A) NEG-NEG   POC GLUCOSE    Collection Time: 03/08/19  9:57 PM   # # Low-High    Glucose, POC 184 (H) 70 - 100 MG/DL   POC GLUCOSE    Collection Time: 03/09/19  7:21 AM   # # Low-High    Glucose, POC 124 (H) 70 - 100 MG/DL   CBC CELLULAR THERAPEUTICS    Collection Time: 03/09/19  7:53 AM   # # Low-High    White Blood Cells 7.2 4.5 - 11.0 K/UL    RBC 3.95 (L) 4.4 - 5.5 M/UL    Hemoglobin 12.1 (L) 13.5 - 16.5 GM/DL    Hematocrit 40.9 (L) 40 - 50 %    MCV 89.1 80 - 100 FL    MCH 30.6 26 - 34 PG    MCHC 34.4 32.0 - 36.0 G/DL    RDW 81.1 11 - 15 %    Platelet Count 148 (L) 150 - 400 K/UL  MPV 7.3 7 - 11 FL   BASIC METABOLIC PANEL CELLULAR THERAPEUTICS    Collection Time: 03/09/19  7:53 AM   # # Low-High    Sodium 136 (L) 137 - 147 MMOL/L    Potassium 3.5 3.5 - 5.1 MMOL/L Chloride 102 98 - 110 MMOL/L    CO2 29 21 - 30 MMOL/L    Anion Gap 5 3 - 12    Glucose 129 (H) 70 - 100 MG/DL    Blood Urea Nitrogen 11 7 - 25 MG/DL    Creatinine 1.61 0.4 - 1.24 MG/DL    Calcium 8.3 (L) 8.5 - 10.6 MG/DL    eGFR Non African American >60 >60 mL/min    eGFR African American >60 >60 mL/min   POC GLUCOSE    Collection Time: 03/09/19 11:46 AM   # # Low-High    Glucose, POC 168 (H) 70 - 100 MG/DL     Chart reviewed, nursing notes and therapy notes reviewed.   Case discussed with the attending physician.     Gareth Morgan, M.D.   Physical Medicine and Rehabilitation (PM&R)   Resident Physician, PGY-2   Pager: [phone]  03/09/2019   1:35 PM

## 2019-03-09 NOTE — Progress Notes
SPEECH-LANGUAGE PATHOLOGY     03/09/19 0900   Behavior   Comments* Patient was more alert this morning and stating he was feeling better.  With the assist of nursing, sat patient up to eat his breakfast.  Due to severe spasticity and hand tremor he was not able to feed himself.  The clinician assisted.  Patient consumed 1/2 of his cold cereal and a few sips of his Boost.   Verbal Expression Goals   Therapy Activities Conversation;Verbal description  (Response to open ended questions)   Verbal Description Moderate prompting - 25-49% of the time   Conversation Minimal prompting - 50-74% of the time   Therapy Activity Comments Patient was asked to name his children and grandchildren.  Recall was poor today. Patient stated "I can't remember".  Mrs. Fazzini may want to provide pictures with names of immediate family members for Mr. Buehl to look at to assist with recall.  Reviewed Patient's therapy schedule and asked simple time concept questions: Significant response delay, <50% accy w/ mod to max cues.  Temporal orientation questions using a calendar:  60% accy min to mod cues, less of a response delay.    Conversation: patient exhibited difficulty maintaining a conversation regarding self and family due to deficits with cognition and language.  Informed CNA at the end of the session to assist patient in getting dressed.    Patient Will Express Themselves During a 5-10 Minute Conversation with Progressing;Minimal prompting - patient able to 50-74% of the time   Patient Will Express Basic Wants and Needs for Daily Living Skills with Progressing;Minimum assist;Moderate assist      Arna Snipe.A.L/CCC/SLP

## 2019-03-09 NOTE — Progress Notes
OCCUPATIONAL THERAPY     03/09/19 0915   Cognitive   Orientation Person;Place   Patient Behavior Calm;Cooperative   Cognition Decrease attention/ concentration   Subjective   Subjective Patient demonstrating increased difficulty with verbal communication this date.  He also was unable to verbalize birthdate or today's date.     Upper Body Dressing   UE Dressing Assist Total Assist   Upper Dressing Position Sitting edge of bed  (with maximum assist for trunk stability during task.)   Upper Dressing Garments/Equipment Abdominal binder   Upper Dressing Comments Decreased functional use of R hand noted and patient was unable to use R hand to thread over LUE.  Required assist for all parts.   Bed Mobility   Bed Mobility: Sit to Supine Assist Level Assist of two   Bed Mobility: Sit to supine type of assist Assist with bilateral lower extremity;Assist with trunk   Transfers   Transfer: Assistive Device Mechanical Lift  (electric sit to stand lift)   Transfer Comment Decreased stance noted today with sling up to B axilla. 2nd person steadied patient in lift during transfer for safety.     Postural Control   Balance: Sitting Static Poor  (Requiring frequent maximum assist )   Activity   Therapeutic Activities OT addressing ADLs and functional mobility as described above.  Decreased functional use of RUE, especially hand function, noted today.    Assessment   Assessment Patient demonstrating decline in ability to safely tolerate sit to stand lift, bed mobility, and also functional use of RUE.  MD's notified and were present during OT session to assess patient.    Plan   Plan Comments Continue with currently plan of care as patient is able to tolerate.       Molli Knock, OTR/L

## 2019-03-09 NOTE — Progress Notes
Per Dr Bennie Pierini, Pt is going to ED from MRI for evaluation of new onset of stroke

## 2019-03-09 NOTE — Progress Notes
OCCUPATIONAL THERAPY     03/08/19 1030   Cognitive   Patient Behavior Calm;Cooperative   Cognition Follows Commands   Subjective   Subjective Patient was agreeable to therapy session.     Transfers   Transfer: Assistive Device Mechanical Lift;None   Transfer: Sit to stand assist level Maximum assistance   Transfer: Sit to stand type of assist Facilitation of hip extension;Facilitation of trunk;Facilitation of weight shift;Increased time to complete   Transfer: Stand to sit assist level Moderate assistance   Transfer: Stand to sit type of assist For safety;Requires cues for positioning of hands   Transfer: Stand pivot assist level Maximum assistance   Transfer: Stand pivot type of assist Facilitation of hip extension;Facilitation of weight shift;For safety   Transfer Comment Stand pivot transfer from recliner to edge of bed.   Mechanical Sit to Stand Lift from bed to/from WC x 2.    Activity   Therapeutic Activities OT addressing patient's ability to maintain sitting balance on edge of bed for performance of Sit to stand lift transfer with one person assist in order to prepare for discharge to home.  Patient continues to require 2nd person assist to maintain sitting balance while therapist positions lift in front of him and places feet on platform.   Patient was retropulsive and leaning either to the R of L.  Mirror was used to assist with lateral midline orientation.     Assessment   Assessment Patient continues to require two person assist for getting setup in sit to stand lift while sitting edge of bed.  Sitting balance on edge of bed was poor, requiring moderate to maximum assist.    Plan   Plan Comments Sitting balance, both static and dynamic, functional transfers, UB/LB dressing, eating, and grooming; functional cognition, family training.      Molli Knock, OTR

## 2019-03-10 ENCOUNTER — Encounter: Admit: 2019-03-10 | Discharge: 2019-03-10

## 2019-03-10 DIAGNOSIS — K7290 Hepatic failure, unspecified without coma: Secondary | ICD-10-CM

## 2019-03-10 LAB — ALPHA FETO PROTEIN (AFP)
Lab: 2 ng/mL (ref 0.0–15.0)
Lab: 2.1 ng/mL — ABNORMAL LOW (ref >40)

## 2019-03-10 LAB — CANNABINOIDS-URINE RANDOM: Lab: NEGATIVE

## 2019-03-10 LAB — URINALYSIS, MICROSCOPIC

## 2019-03-10 LAB — CBC AND DIFF
Lab: 14 % — ABNORMAL LOW (ref 11–15)
Lab: 149 K/UL — ABNORMAL LOW (ref 150–400)
Lab: 30 pg (ref 26–34)
Lab: 33 g/dL — ABNORMAL HIGH (ref 32.0–36.0)
Lab: 35 % (ref 24–44)
Lab: 36 % — ABNORMAL LOW (ref 40–50)
Lab: 4.1 M/UL — ABNORMAL LOW (ref 4.4–5.5)
Lab: 50 % (ref 41–77)
Lab: 6.9 10*3/uL (ref 4.5–11.0)
Lab: 7.2 FL (ref 7–11)
Lab: 9 % (ref 4–12)

## 2019-03-10 LAB — PROTIME INR (PT): Lab: 1.2 g/dL — ABNORMAL LOW (ref 0.8–1.2)

## 2019-03-10 LAB — PTT (APTT)
Lab: 33 s — ABNORMAL LOW (ref 24.0–36.5)
Lab: 32 s (ref 24.0–36.5)

## 2019-03-10 LAB — BLOOD GASES, ARTERIAL
Lab: 28 MMOL/L — ABNORMAL HIGH (ref 21–28)
Lab: 44 mmHg (ref 35–45)
Lab: 7.4 (ref 7.35–7.45)
Lab: 97 % (ref 95–99)

## 2019-03-10 LAB — URINALYSIS DIPSTICK
Lab: NEGATIVE
Lab: NEGATIVE
Lab: NEGATIVE
Lab: NEGATIVE
Lab: NEGATIVE
Lab: NEGATIVE

## 2019-03-10 LAB — POC GLUCOSE
Lab: 109 mg/dL — ABNORMAL HIGH (ref 70–100)
Lab: 154 mg/dL — ABNORMAL HIGH (ref 70–100)
Lab: 91 mg/dL (ref 70–100)
Lab: 94 mg/dL (ref 70–100)
Lab: 96 mg/dL (ref 70–100)
Lab: 99 mg/dL (ref 70–100)

## 2019-03-10 LAB — GRAM STAIN

## 2019-03-10 LAB — AMPHETAMINES-URINE RANDOM: Lab: NEGATIVE

## 2019-03-10 LAB — POC LACTATE: Lab: 1.1 MMOL/L (ref 0.5–2.0)

## 2019-03-10 LAB — COVID-19 (SARS-COV-2) PCR

## 2019-03-10 LAB — POC CREATININE, RAD: Lab: 0.7 mg/dL (ref 0.4–1.24)

## 2019-03-10 LAB — COCAINE-URINE RANDOM: Lab: NEGATIVE

## 2019-03-10 LAB — OPIATES-URINE RANDOM: Lab: NEGATIVE

## 2019-03-10 LAB — POC TROPONIN: Lab: 0 ng/mL (ref 0.00–0.05)

## 2019-03-10 LAB — CREATINE KINASE-CPK: Lab: 115 U/L (ref 35–232)

## 2019-03-10 LAB — MAGNESIUM: Lab: 1.9 mg/dL (ref 1.6–2.6)

## 2019-03-10 LAB — LACTIC ACID(LACTATE): Lab: 0.8 MMOL/L (ref 0.5–2.0)

## 2019-03-10 LAB — POC PT/INR: Lab: 1.1 (ref 0.8–1.2)

## 2019-03-10 LAB — BENZODIAZEPINES-URINE RANDOM: Lab: NEGATIVE

## 2019-03-10 LAB — COMPREHENSIVE METABOLIC PANEL
Lab: 137 MMOL/L (ref 137–147)
Lab: 3.6 MMOL/L (ref 3.5–5.1)

## 2019-03-10 LAB — AMMONIA: Lab: 131 umol/L — ABNORMAL HIGH (ref 9–35)

## 2019-03-10 LAB — BNP (B-TYPE NATRIURETIC PEPTI): Lab: 58 pg/mL (ref 0–100)

## 2019-03-10 LAB — PHENCYCLIDINES-URINE RANDOM: Lab: NEGATIVE

## 2019-03-10 LAB — BARBITURATES-URINE RANDOM: Lab: NEGATIVE

## 2019-03-10 MED ORDER — LACTULOSE(#) 200GM/300ML RECTAL ENEMA
200 g | RECTAL | 0 refills | Status: DC
Start: 2019-03-10 — End: 2019-03-16
  Administered 2019-03-10 – 2019-03-16 (×12): 200 g via RECTAL

## 2019-03-10 MED ORDER — ASPIRIN 81 MG PO TBEC
81 mg | Freq: Every morning | ORAL | 0 refills | Status: DC
Start: 2019-03-10 — End: 2019-03-10

## 2019-03-10 MED ORDER — CLOPIDOGREL 75 MG PO TAB
75 mg | Freq: Every day | ORAL | 0 refills | Status: DC
Start: 2019-03-10 — End: 2019-03-16
  Administered 2019-03-10 – 2019-03-16 (×7): 75 mg via ORAL

## 2019-03-10 MED ORDER — INSULIN GLARGINE 100 UNIT/ML (3 ML) SC INJ PEN
5 [IU] | Freq: Every evening | SUBCUTANEOUS | 0 refills | Status: DC
Start: 2019-03-10 — End: 2019-03-16
  Administered 2019-03-11: 03:00:00 5 [IU] via SUBCUTANEOUS

## 2019-03-10 MED ORDER — CITALOPRAM 10 MG PO TAB
20 mg | Freq: Every morning | ORAL | 0 refills | Status: DC
Start: 2019-03-10 — End: 2019-03-16
  Administered 2019-03-11 – 2019-03-16 (×6): 20 mg via ORAL

## 2019-03-10 MED ORDER — LACTULOSE 10 GRAM/15 ML PO SOLN WRAPPER
30 mL | ORAL | 0 refills | Status: DC
Start: 2019-03-10 — End: 2019-03-16
  Administered 2019-03-11 – 2019-03-15 (×18): 20 g via ORAL

## 2019-03-10 MED ORDER — ATORVASTATIN 40 MG PO TAB
80 mg | Freq: Every evening | ORAL | 0 refills | Status: DC
Start: 2019-03-10 — End: 2019-03-16
  Administered 2019-03-11 – 2019-03-16 (×6): 80 mg via ORAL

## 2019-03-10 MED ORDER — DEXTRAN 70-HYPROMELLOSE (PF) 0.1-0.3 % OP DPET
1 [drp] | Freq: Three times a day (TID) | OPHTHALMIC | 0 refills | Status: DC
Start: 2019-03-10 — End: 2019-03-16
  Administered 2019-03-10 – 2019-03-16 (×16): 1 [drp] via OPHTHALMIC

## 2019-03-10 MED ORDER — PROCHLORPERAZINE MALEATE 10 MG PO TAB
10 mg | ORAL | 0 refills | Status: DC | PRN
Start: 2019-03-10 — End: 2019-03-16

## 2019-03-10 MED ORDER — ENOXAPARIN 40 MG/0.4 ML SC SYRG
40 mg | Freq: Every day | SUBCUTANEOUS | 0 refills | Status: DC
Start: 2019-03-10 — End: 2019-03-16
  Administered 2019-03-10 – 2019-03-16 (×7): 40 mg via SUBCUTANEOUS

## 2019-03-10 MED ORDER — PANTOPRAZOLE 20 MG PO TBEC
20 mg | Freq: Every day | ORAL | 0 refills | Status: DC
Start: 2019-03-10 — End: 2019-03-16
  Administered 2019-03-11 – 2019-03-16 (×6): 20 mg via ORAL

## 2019-03-10 MED ORDER — MIDODRINE 5 MG PO TAB
5 mg | Freq: Three times a day (TID) | ORAL | 0 refills | Status: DC
Start: 2019-03-10 — End: 2019-03-16
  Administered 2019-03-10 – 2019-03-16 (×16): 5 mg via ORAL

## 2019-03-10 MED ORDER — ALBUMIN, HUMAN 25 % IV SOLP
25 g | Freq: Once | INTRAVENOUS | 0 refills | Status: CP
Start: 2019-03-10 — End: ?
  Administered 2019-03-10: 11:00:00 25 g via INTRAVENOUS

## 2019-03-10 MED ORDER — BISACODYL 10 MG RE SUPP
10 mg | Freq: Two times a day (BID) | RECTAL | 0 refills | Status: DC | PRN
Start: 2019-03-10 — End: 2019-03-16

## 2019-03-10 MED ORDER — SODIUM CHLORIDE 0.9 % IV SOLP
250 mL | INTRAVENOUS | 0 refills | Status: DC | PRN
Start: 2019-03-10 — End: 2019-03-11

## 2019-03-10 MED ORDER — ASPIRIN 81 MG PO TBEC
81 mg | Freq: Every morning | ORAL | 0 refills | Status: DC
Start: 2019-03-10 — End: 2019-03-16
  Administered 2019-03-11 – 2019-03-16 (×6): 81 mg via ORAL

## 2019-03-10 MED ORDER — MELATONIN 3 MG PO TAB
3 mg | Freq: Every evening | ORAL | 0 refills | Status: DC
Start: 2019-03-10 — End: 2019-03-16

## 2019-03-10 MED ORDER — INSULIN ASPART 100 UNIT/ML SC FLEXPEN
0-6 [IU] | Freq: Before meals | SUBCUTANEOUS | 0 refills | Status: DC
Start: 2019-03-10 — End: 2019-03-16
  Administered 2019-03-11: 17:00:00 1 [IU] via SUBCUTANEOUS

## 2019-03-10 MED ORDER — ACETAMINOPHEN 500 MG PO TAB
500 mg | ORAL | 0 refills | Status: DC | PRN
Start: 2019-03-10 — End: 2019-03-16

## 2019-03-10 MED ORDER — PROCHLORPERAZINE EDISYLATE 5 MG/ML IJ SOLN
10 mg | INTRAVENOUS | 0 refills | Status: DC | PRN
Start: 2019-03-10 — End: 2019-03-16
  Administered 2019-03-12 – 2019-03-16 (×5): 10 mg via INTRAVENOUS

## 2019-03-10 MED ORDER — SODIUM CHLORIDE 0.9 % IV SOLP
250 mL | INTRAVENOUS | 0 refills | Status: DC | PRN
Start: 2019-03-10 — End: 2019-03-10

## 2019-03-10 MED ORDER — LATANOPROST 0.005 % OP DROP
1 [drp] | Freq: Every evening | OPHTHALMIC | 0 refills | Status: DC
Start: 2019-03-10 — End: 2019-03-16
  Administered 2019-03-11: 02:00:00 1 [drp] via OPHTHALMIC

## 2019-03-10 NOTE — Acute Stroke Response
RN Stroke Activation Summary       Date of Service:  03/09/2019  Mitchell Lucero is a 68 y.o.  male.     DOB: [birth year]                  MRN#:  [id number]  Allergies:  Patient has no known allergies.    Patient Arrival: 28  ASRT Arrival: 1901  Location of Response : ED Rm 32    Page Received: 1856     Outside Hospital: Rehab    Clinical Presentation: Decreased LOC, Right sided weakness, Dysarthria    Total Stroke Scale Score: 10    Signs & Symptoms: Last Known Well  Last Known Well - Date: 03/09/19  Last Known Well - Time: 0900     Dysphagia screen:  Did Patient Pass The Swallow Screen Part I?: No  If No Name of Physician Notified: Shamim                  Assessment & Plan     Summary:  Pt is a 68yo male presenting with new onset lethargy, right sided weakness and dysarthria. Pt came from a rehab facility where he is being treated following a previous stroke earlier this month.The rehab facility states he had symptoms primarily starting this morning. Pt has a history of DM, HTN, and previous stroke. CT head negative for blood. Pt had an MRI earlier today which demonstrated an evolving pontine stroke and white matter disease.    Radiology/Interventional Delay  Decision to IR: No  Delays: No      No current facility-administered medications for this encounter.     N/A      Plan: Continue to workup in ED, pt not a stroke. Possibly encephalopathic.         RN handoff: Alvis Lemmings, RN     History of Present Illness     Medical History:   Diagnosis Date   ??? DM (diabetes mellitus) (HCC)    ??? Hypertension    ??? TIA (transient ischemic attack)    ??? Tremors of nervous system    ??? Weight loss      Surgical History:   Procedure Laterality Date   ??? HX BACK SURGERY      C6-7 ACDF (cervical)     Social History     Socioeconomic History   ??? Marital status: Married     Spouse name: [name]   ??? Number of children: 3   ??? Years of education: Not on file   ??? Highest education level: Not on file   Occupational History   ??? Not on file Tobacco Use   ??? Smoking status: Never Smoker   ??? Smokeless tobacco: Former Neurosurgeon     Types: Chew   Substance and Sexual Activity   ??? Alcohol use: Yes     Comment: occasional   ??? Drug use: Never   ??? Sexual activity: Not on file   Other Topics Concern   ??? Not on file   Social History Narrative    - Anterior cervical discectomy and fusion in 1997    - Used to work building metal tanks prior to retiring         Danton Clap, Charity fundraiser

## 2019-03-10 NOTE — Patient Instructions
INTERVENTIONAL RADIOLOGY DISCHARGE INSTRUCTIONS  PARACENTESIS  ???A paracentesis is the removal of an abnormal buildup of fluid in your abdominal cavity.??? This fluid buildup is called ascites and may be caused by conditions such as liver disease, heart failure, or cancer.During this procedure, a needle is inserted into your abdomen to drain the fluid.??? The fluid may then be sent to the lab for testing if medically indicated.??? Removal of the fluid may also relieve belly pressure and shortness of breath caused by the ascites.???  POST-PROCEDURE ACTIVITY:  ??? A responsible adult must drive you home.??? ???If you receive sedation, you should not drive or operate heavy machinery or do anything that requires concentration for at least 24 hours after the procedure.  ??? It is recommended that a responsible adult be with you until morning.  ??? Rest today; you may resume normal activity tomorrow.???  POST-PROCEDURE SITE CARE:  ??? Keep the bandage dry.??? You may remove it after 24 hours.???  ??? A dry gauze bandage may be reapplied as necessary to protect your clothing as the site may sometimes leak for several days after the procedure.???  ??? You may shower in 24 hours, after the bandage is removed.???  ??? Do not submerge the site underwater for 1 week or until fully healed (no tub bath, swimming/hot tub, etc.)???  ??? Be sure your hands are clean when touching near the site.  ??? Do not use ointments, creams or powders on the puncture site.???  DIET/MEDICATIONS:  ??? You may resume your previous diet after the procedure.  ??? If you receive sedation or narcotic pain medications, avoid any foods or beverages containing alcohol for at least 24 hours after the procedure.  ??? Please see the Medication Reconciliation sheet for instructions on resuming your home medications.???  CALL THE DOCTOR IF:  ??? Bright red blood has soaked the bandage.  ??? You have severe abdominal pain unrelieved by pain medications.??? Some soreness is to be expected. ??? You have blood in your urine.  ??? You have signs of infection:  ? Chills, fever greater than 101F.  ? Increased redness, warmth or swelling at the puncture site.  ? Pus draining from the puncture site.???  ???  For any of the above symptoms or for problems or concerns related to the procedure,??? call??? [phone] for Monday-Friday 7-5.??? After-hours and weekends, please call??? [phone] and ask for the Interventional Radiology Resident on-call.  ???

## 2019-03-10 NOTE — Case Management (ED)
Case Management Progress Note    NAME:Mitchell Lucero                          MRN: [id number]              DOB:[birth year]          AGE: 68 y.o.  ADMISSION DATE: 03/09/2019             DAYS ADMITTED: LOS: 1 day      Todays Date: 03/10/2019    Plan  Anticipate discharge back to [facility] when medically stable pending bed availability     Interventions  ? Support      ? Info or Referral      ? Discharge Planning  ? SW reviewed EMR and attended huddle.  ? Pt admitted from [facility].  ? SW updated [facility] rehab admissions team that pt is not anticipated to be stable to discharge this week. Anticipate discharge next week.  ? SW will continue to follow up as needed.      ? Medication Needs      o Financial      ? Legal      ? Other        Disposition  ? Expected Discharge Date    Expected Discharge Date: 03/14/19  ? Transportation      ? Next Level of Care (Acute Psych discharges only)      ? Discharge Disposition                                          Durable Medical Equipment      No service has been selected for the patient.      [place] [facility]      No service has been selected for the patient.      [facility]      No service has been selected for the patient.      [facility]      No service has been selected for the patient.        Azalia Bilis, LMSW  Pager: [phone]  Desk: [phone]

## 2019-03-10 NOTE — ED Notes
Per [facility], pt FAILS swallow assessment.

## 2019-03-10 NOTE — Consults
Hepatology Consult Note  Patient Name:Mitchell Lucero         FAO:[id number]  Admission Date: 03/09/2019  5:45 PM  LOS: LOS: 1 day        Reason for consult:  [facility] admitted for encephalopathy and acute/progression stroke -- please eval/assist in care - thoughts on therapeutic anticoiagulation to prevent stroke (see neuro's note) ?inpatietn liver biopsy? rifaximin and other inputs    Assessment/Plan:  Mitchell Lucero is a 68 y.o. 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  He is readmitted from rehab due to worsening sleepiness and confusion and ongoing constipation despite taking lactulose.    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 Constance Lucero.  No history of decompensation with ascites, HE, or EVB during last hospitalization. Now with AMS and ascites.  >> Daily MELD labs  >> High protein diet with nutritional supplements    Transplant candidacy:  Despite HCC being within [place] criteria, patient is not a candidate due to his comorbidities  >> Now with new infarcts, recommend goals of care discussions     AMS  Hx of recent infarcts and new infarcted areas on MRI 8/26  Concern for hepatic encephalopathy  Started on lactulose at rehab facility, but patient unable to tolerate due to his gag reflex.  Ultrasound liver with Doppler shows patent vessels, now w/ mild ascites UDS is negative, recent UTI 8/17, pending urine culture, chest x-ray negative, elevated Ammonia 131  CT head is negative, MRI of the head shows progression of now moderate acute???early subacute right superior cerebellar/brachium pontis infarcts and new tiny acute or early subacute right tectal superior cerebellar peduncle infarct without hemorrhagic conversion.  Expected evolution of the lateral pontine infarct.  Dx and therapeutic paracentesis performed, 3L removed, no SBP  >> In view of the MRI findings, do not suspect hepatic encephalopathy is underlying etiology of his altered mental status  >> Consider completion of infectious work-up with blood cultures  >> Agree with holding baclofen, however, follow-up with neurology recommendations  >> hold lactulose 2/2 patient intolerance. May consider Xifaxan.   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  AFP 2.1 (non-excreter)  >> Patient does not require a liver biopsy    Fluid management  + ascites on imaging and exam, no LEE  LVP 3 L 8/27, no SBP  >> 2g low-sodium diet  >> Strict I&Os  >> May require diuretics going forward.      Esophageal varices  No prior endoscopies. Plan as outpatient.    Patient seen and discussed with attending on service, Dr. Elam Lucero.    Mitchell Ee, MD  Gastroenterology & Hepatology Fellow  Pager [phone]  ______________________________________________________________________      HPI/Subjective:  Mitchell Lucero is a 68 y.o. 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  He is readmitted from rehab due to worsening sleepiness and confusion and ongoing constipation despite taking lactulose. Patient was admitted from rehab due to worsening altered mental status with worsening somnolence and waxing and waning mental status.  Upon exam patient is asleep and only slightly arousable, not able to follow commands.  Patient's wife provides most of his history.  She reports over the last 1 week he has been more sleepy, over the last 4 days he has been barely awake during the evenings.  Yesterday he also stopped participating in rehab activities.    Over the last week he has been started on lactulose, but has not been able to tolerated with severe gag reflex and decrease in any liquid oral intake because lactulose was being mixed in with other liquids.    Patient has not had sufficient bowel movements either.    No fevers or chills, or other precipitating symptoms were reported from the patient.    Review of Systems:  Unable to provide, too lethargic    Medical History:   Diagnosis Date   ??? CAD (coronary artery disease) 1997    S/P pci   ??? 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 DM (diabetes mellitus) (HCC)    ??? Hypertension    ??? Stroke (HCC)     12/2018 (first one), 01/2019 , 02/2019   ??? Tremors of nervous system      Surgical History:   Procedure Laterality Date   ??? PERCUTANEOUS CORONARY INTERVENTION  1997   ??? HX BACK SURGERY      C6-7 ACDF (cervical)     Social History     Socioeconomic History   ??? Marital status: Married     Spouse name: Mitchell Lucero   ??? Number of children: 3   ??? Years of education: Not on file   ??? Highest education level: Not on file   Occupational History   ??? Not on file   Tobacco Use   ??? Smoking status: Never Smoker   ??? Smokeless tobacco: Former Neurosurgeon     Types: Chew   Substance and Sexual Activity   ??? Alcohol use: Yes     Comment: occasional   ??? Drug use: Never   ??? Sexual activity: Not on file   Other Topics Concern   ??? Not on file   Social History Narrative    - Anterior cervical discectomy and fusion in 1997    - Used to work building metal tanks prior to retiring     Family History Problem Relation Age of Onset   ??? Coronary Artery Disease Other         biological father's side   ??? Heart Disease Other    ??? Stroke Other        Current medications:  Scheduled Meds:aspirin EC tablet 81 mg, 81 mg, Oral, QAM8  atorvastatin (LIPITOR) tablet 80 mg, 80 mg, Oral, QHS  citalopram (CeleXA) tablet 20 mg, 20 mg, Oral, QAM8  dextran 70/hypromellose (GENTEAL TEARS) ophthalmic solution 1 drop, 1 drop, Both Eyes, TID  enoxaparin (LOVENOX) syringe 40 mg, 40 mg, Subcutaneous, QDAY(21)  insulin aspart U-100 (NOVOLOG FLEXPEN) injection PEN 0-6 Units, 0-6 Units, Subcutaneous, ACHS (22)  insulin glargine (LANTUS SOLOSTAR)  injection PEN 5 Units, 5 Units, Subcutaneous, QHS  lactulose oral solution 20 g, 30 mL, Oral, Q4H    Or  lactulose(#) 200 g/300 mL rectal enema 200 g, 200 g, Rectal, Q4H  latanoprost (XALATAN) 0.005 % ophthalmic solution 1 drop, 1 drop, Both Eyes, QHS  melatonin tablet 3 mg, 3 mg, Oral, QHS  midodrine (PROAMATINE) tablet 5 mg, 5 mg, Oral, TID  pantoprazole DR (PROTONIX) tablet 20 mg, 20 mg, Oral, QDAY    Continuous Infusions:  PRN and Respiratory Meds:acetaminophen Q6H PRN, bisacodyL BID PRN, prochlorperazine Q6H PRN **OR** prochlorperazine Q6H PRN          Objective  Intake/Output Summary:  (Last 24 hours)    Intake/Output Summary (Last 24 hours) at 03/10/2019 0801  Last data filed at 03/09/2019 2130  Gross per 24 hour   Intake ???   Output 300 ml   Net -300 ml                                  Vital Signs: Last Filed                 Vital Signs: 24 Hour Range   BP: 155/83 (08/27 0720)  Temp: 36.9 ???C (98.4 ???F) (08/27 0720)  Pulse: 78 (08/27 0720)  Respirations: 18 PER MINUTE (08/27 0720)  SpO2: 100 % (08/27 0720)  SpO2 Pulse: 78 (08/27 0649)  Height: 160 cm (63) (08/26 1749) BP: (114-162)/(79-98)   Temp:  [36.6 ???C (97.8 ???F)-36.9 ???C (98.4 ???F)]   Pulse:  [71-87]   Respirations:  [14 PER MINUTE-22 PER MINUTE]   SpO2:  [94 %-100 %]      Vitals:    03/09/19 1749   Weight: 70.3 kg (155 lb) Body mass index is 27.46 kg/m???.    Physical Exam:  General - lethargic, but slightly arousable, no acute distress.   Head - Normocephalic, atraumatic.   Eyes -  EOMI grossly. No icterus.   Oropharynx- No ulcer or bleeding, moist mucosa.  Neck - No swelling or tracheal deviation.   Pulmonary/Chest - Normal effort, normal breath sounds.  CV - Normal rate, regular rhythm, normal heart sounds and intact distal pulses.  Abd - mildly distended, soft, No TTP. Normal bowel sounds. No masses palpable.  Extremities - Warm, dry. No LEE.  Skin - No exposed rash, no lesions.  Neurological - lethargic, not following commands. Protecting airways    Labs/Imaging:  Pertient labs/imaging were reviewed on initiation of progress note.  24-hour labs:    Results for orders placed or performed during the hospital encounter of 03/09/19 (from the past 24 hour(s))   COMPREHENSIVE METABOLIC PANEL    Collection Time: 03/09/19  6:02 PM   Result Value Ref Range    Sodium 137 137 - 147 MMOL/L    Potassium 3.6 3.5 - 5.1 MMOL/L    Chloride 102 98 - 110 MMOL/L    Glucose 158 (H) 70 - 100 MG/DL    Blood Urea Nitrogen 12 7 - 25 MG/DL    Creatinine 1.61 0.4 - 1.24 MG/DL    Calcium 8.4 (L) 8.5 - 10.6 MG/DL    Total Protein 6.0 6.0 - 8.0 G/DL    Total Bilirubin 1.5 (H) 0.3 - 1.2 MG/DL    Albumin 2.5 (L) 3.5 - 5.0 G/DL    Alk Phosphatase 096 (H) 25 - 110 U/L    AST (SGOT) 28 7 - 40 U/L  CO2 30 21 - 30 MMOL/L    ALT (SGPT) 23 7 - 56 U/L    Anion Gap 5 3 - 12    eGFR Non African American >60 >60 mL/min    eGFR African American >60 >60 mL/min   CBC AND DIFF    Collection Time: 03/09/19  6:02 PM   Result Value Ref Range    White Blood Cells 6.9 4.5 - 11.0 K/UL    RBC 4.11 (L) 4.4 - 5.5 M/UL    Hemoglobin 12.4 (L) 13.5 - 16.5 GM/DL    Hematocrit 16.1 (L) 40 - 50 %    MCV 88.9 80 - 100 FL    MCH 30.1 26 - 34 PG    MCHC 33.9 32.0 - 36.0 G/DL    RDW 09.6 11 - 15 %    Platelet Count 149 (L) 150 - 400 K/UL    MPV 7.2 7 - 11 FL    Neutrophils 50 41 - 77 % Lymphocytes 35 24 - 44 %    Monocytes 9 4 - 12 %    Eosinophils 5 0 - 5 %    Basophils 1 0 - 2 %    Absolute Neutrophil Count 3.40 1.8 - 7.0 K/UL    Absolute Lymph Count 2.43 1.0 - 4.8 K/UL    Absolute Monocyte Count 0.63 0 - 0.80 K/UL    Absolute Eosinophil Count 0.35 0 - 0.45 K/UL    Absolute Basophil Count 0.06 0 - 0.20 K/UL    MDW (Monocyte Distribution Width) 20.6 <20.7   PROTIME INR (PT)    Collection Time: 03/09/19  6:02 PM   Result Value Ref Range    INR 1.2 0.8 - 1.2   PTT (APTT)    Collection Time: 03/09/19  6:02 PM   Result Value Ref Range    APTT 33.4 24.0 - 36.5 SEC   CREATINE KINASE-CPK    Collection Time: 03/09/19  6:02 PM   Result Value Ref Range    Creatine Kinase 115 35 - 232 U/L   MAGNESIUM    Collection Time: 03/09/19  6:02 PM   Result Value Ref Range    Magnesium 1.9 1.6 - 2.6 mg/dL   POC TROPONIN    Collection Time: 03/09/19  7:03 PM   Result Value Ref Range    Troponin-I-POC 0.00 0.00 - 0.05 NG/ML   POC GLUCOSE    Collection Time: 03/09/19  7:05 PM   Result Value Ref Range    Glucose, POC 154 (H) 70 - 100 MG/DL   POC CREATININE, RAD    Collection Time: 03/09/19  7:05 PM   Result Value Ref Range    Creatinine, POC 0.7 0.4 - 1.24 MG/DL   POC PT/INR    Collection Time: 03/09/19  7:05 PM   Result Value Ref Range    INR POC 1.1 0.8 - 1.2   AMPHETAMINES-URINE RANDOM    Collection Time: 03/09/19  8:30 PM   Result Value Ref Range    Amphetamines NEG NEG-NEG   BARBITURATES-URINE RANDOM    Collection Time: 03/09/19  8:30 PM   Result Value Ref Range    Barbiturates,Urine NEG NEG-NEG   BENZODIAZEPINES-URINE RANDOM    Collection Time: 03/09/19  8:30 PM   Result Value Ref Range    Benzodiazepines NEG NEG-NEG   CANNABINOIDS-URINE RANDOM    Collection Time: 03/09/19  8:30 PM   Result Value Ref Range    THC NEG NEG-NEG   COCAINE-URINE RANDOM  Collection Time: 03/09/19  8:30 PM   Result Value Ref Range    Cocaine-Urine NEG NEG-NEG   OPIATES-URINE RANDOM    Collection Time: 03/09/19  8:30 PM Result Value Ref Range    Opiates-Urine NEG NEG-NEG   PHENCYCLIDINES-URINE RANDOM    Collection Time: 03/09/19  8:30 PM   Result Value Ref Range    Phencyclidine (PCP) NEG NEG-NEG   URINALYSIS DIPSTICK    Collection Time: 03/09/19  8:30 PM   Result Value Ref Range    Color,UA AMBER     Turbidity,UA CLEAR CLEAR-CLEAR    Specific Gravity-Urine 1.014 1.003 - 1.035    pH,UA 6.0 5.0 - 8.0    Protein,UA 1+ (A) NEG-NEG    Glucose,UA NEG NEG-NEG    Ketones,UA NEG NEG-NEG    Bilirubin,UA NEG NEG-NEG    Blood,UA 3+ (A) NEG-NEG    Urobilinogen,UA INCREASED (A) NORM-NORMAL    Nitrite,UA NEG NEG-NEG    Leukocytes,UA NEG NEG-NEG    Urine Ascorbic Acid, UA NEG NEG-NEG   URINALYSIS, MICROSCOPIC    Collection Time: 03/09/19  8:30 PM   Result Value Ref Range    WBCs,UA 2-10 0 - 2 /HPF    RBCs,UA PACKED 0 - 3 /HPF   AMMONIA    Collection Time: 03/09/19  8:47 PM   Result Value Ref Range    Ammonia 131 (H) 9 - 35 MCMOL/L   COVID-19 (SARS-COV-2) PCR    Collection Time: 03/09/19  8:54 PM    Specimen: Nasopharyngeal Swab   Result Value Ref Range    COVID-19 (SARS-CoV-2) PCR Source NASOPHARYNGEAL SWAB     COVID-19 (SARS-CoV-2) PCR NOT DETECTED DN-NOT DETECTED   POC LACTATE    Collection Time: 03/09/19  8:54 PM   Result Value Ref Range    LACTIC ACID POC 1.1 0.5 - 2.0 MMOL/L   ALPHA FETO PROTEIN (AFP)    Collection Time: 03/09/19 10:15 PM   Result Value Ref Range    Alpha Feto Protein 2.0 0.0 - 15.0 NG/ML   POC GLUCOSE    Collection Time: 03/10/19  5:39 AM   Result Value Ref Range    Glucose, POC 96 70 - 100 MG/DL   ALPHA FETO PROTEIN (AFP)    Collection Time: 03/10/19  5:45 AM   Result Value Ref Range    Alpha Feto Protein 2.1 0.0 - 15.0 NG/ML   PTT (APTT)    Collection Time: 03/10/19  5:45 AM   Result Value Ref Range    APTT 32.2 24.0 - 36.5 SEC   BNP (B-TYPE NATRIURETIC PEPTI)    Collection Time: 03/10/19  5:45 AM   Result Value Ref Range    B Type Natriuretic Peptide 58.0 0 - 100 PG/ML   LACTIC ACID(LACTATE) Collection Time: 03/10/19  5:45 AM   Result Value Ref Range    Lactic Acid 0.8 0.5 - 2.0 MMOL/L   POC GLUCOSE    Collection Time: 03/10/19  7:45 AM   Result Value Ref Range    Glucose, POC 109 (H) 70 - 100 MG/DL

## 2019-03-10 NOTE — Progress Notes
NAME:Mitchell Lucero             MRN: [id number]             DOB:28-Jan-1951          AGE: 68 y.o.  ADMISSION DATE: 03/09/2019             DAYS ADMITTED: LOS: 1 day    Date of Service:  03/10/2019    Allergies:  Patient has no known allergies.    Type of Acute Stroke Response Team note: Consult       Assessment & Plan     Chief Complaint: Acute onset encephalopathy  Assessment: Mitchell Lucero is a 68 y.o. 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 Patient noted to have waxing and waning mental status at rehab facility through his stay there but this morning he was noted to have decline in functioning so decision was made to obtain STAT MRI which showed a new tiny area of acute or early subacute stroke in right tectal-superior cerebellar peduncle infarct.    Stroke activated on arrival with NIH 10 for BL extremity drift     CTA 8/10 Head: Mild multifocal narrowing of the left ICA. Right A1 segment is hypoplastic with a dominant widely patent left A1 segment. There is persistent mild narrowing at the left M1 origin.moderate focal stenosis of the left vertebral artery at least mild stenosis of the right vertebral   artery.calcified plaque in the basilar artery without stenosis.     CTA 8/10 Neck: Atherosclerotic calcification  right vertebral artery origin resulting in at least mild stenosis. 50% stenosis of RICA origin by NASCET criteria. Atherosclerotic calcification at the left carotid bifurcation and proximal left internal carotid artery results in less than 50% stenosis by NASCET criteria.     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 No new CTA obtained since area of stroke identified on MRI is not consistent with LVO. Additionally, he did not have any new findings on exam concerning for LVO at the time of stroke activation   - MRI head W/WO was completed this AM with new area of stroke   - Allow for permissive hypertension in first 48-72 hours, SBP goal 160 - 220 mmHg and DBP 70-110. IVF prn to maintain this goal  - Telemetry to monitor for arrhythmia   - Evaluation of smoking history and smoking cessation consult if tobaccoism present   - Would continue PTA DAPT for now from a secondary prevention of stroke standpoint given most likely etiology as above.  - Check platelet inhibition test to make sure plavix is effective  - PT/OT/SLP consult eval and treat   - Rehab consult for assessment of post stroke care       PPX: SCDs and Lovenox for DVT prevention     Patient seen and discussed with Dr. Sadie Haber, MD.  Neurology, PGY-2  Pager number [phone]  Available on Voalte      Subjective   No acute events overnight. No new complains per patient or wife.     Review of Systems     All other systems  reviewed and are negative., except for   Generalized weakness, fatigue, deconditioning, dysarthria, orthostatic dizziness.    Stroke Activation Summary     Patient Arrival: 38  ASRT Arrival: 1901  Location of Response : ED Rm 32   Page Received: 1856    Clinical Presentation: Decreased LOC, Right sided weakness, Dysarthria    Signs & Symptoms: Last Known Well  Last Known Well - Date: 03/09/19  Last Known Well - Time: 0900     CT/CTP/CTA:        BP: 155/83 (08/27 0720)  Temp: 36.9 ???C (98.4 ???F) (08/27 0720)  Pulse: 78 (08/27 0720)  Respirations: 18 PER MINUTE (08/27 0720)  SpO2: 100 % (08/27 0720)  SpO2 Pulse: 78 (08/27 0649)  Height: 160 cm (63) (08/26 1749)    NIHSS Completed at: 720 PM       NIH Stroke Scale Item  Scoring Definition  Score 1a. LOC  0=alert and responsive   1=arousable to minor stimulation   2=arousable only to painful stimulation   3=reflex responses or unrousable  1   1b. LOC questions-as patient???s age and month. Must be exact.  0=both correct   1=one correct (or dysarthria, intubated, foreign language)   2=neither correct  2    1c. Commands-open/close eyes, grip and release non-paretic hand (other 1 step commands or mimic OK)  0=both correct (ok if impaired by weakness)   1=one correct   2=neither correct  0    2. Best Gaze-horizontal EOM by voluntary or Mitchell Lucero???s  0=normal   1=partial gaze palsy (abnormal gaze in one or both eyes)   2=forced eye deviation or total paresis which cannot be overcome by Mitchell Lucero???s  0    3. Visual Field-use visual threat if necessary. If monocular, score field of good eye  0=no visual loss   1=partial hemianopia, quadrantanopia, extinction   2=complete hemianopia   3=bilateral hemianopia or blindness  0    4. Facial Palsy-if stuporous, check symmetry of grimace to pain  0=normal   1=minor paralysis, flat NLF, asymm smile   2=partial paralysis (lower face=UMN)   3=complete paralysis (upper and lower face)  1    5. Motor Arm-arms outstretched 90 deg (sitting) or 45 deg (supine) for 10 seconds. Encourage best effort.  0=no drift x 10 seconds   1=drift but doesn???t hit bed   2=some antigravity effort, but can???t sustain   3=no antigravity effort, but even minimal mvt counts   4=no movement at all   X=unable to assess due to amputation, fusion, etc  L/R   1/1    6. Motor Leg-raise leg to 30 degrees supine x 5 seconds  0=no drift x 5 seconds   1=drift but doesn???t hit bed   2=some antigravity effort, but can???t sustain   3=no antigravity effort, but even minimal mvt counts   4=no movement at all   X=unable to assess due to amputation, fusion, etc  L/R   1/1    7. Limb Ataxia-check finger-nose- finger; heel-shin; and score only if out of proportion to paralysis  0=no ataxia (or aphasic, hemiplegic) 1=ataxia in upper or lower extremity   2=ataxia in upper AND lower extremity   X=unable to assess due to amputation, fusion, etc  0   8. Sensory-use safety pin. Check grimace or withdrawal if stuporous. Score only stroke- related losses  0=normal   1=mild-mod unilateral loss but patient aware of touch 9or aphasic, confused)   2=total loss, pt unaware of touch. Coma, bilateral loss  0   9. Best Language-describe cookie jar picture, name objects, read sentences. May use repeating, writing, stereognosis  0=normal   1=mild-mod aphasia (diff but partly comprehensible)   2=severe aphasia (almost no info exchanged)   3=mute, global aphasia, coma. No 1 step commands  1   10. Dysarthria-read list of words  0=normal   1=mild-mod; slurred but intelligible   2=severe; unintelligible or mute  1   11. Extinction/Neglect- simultaneously touch patient on both hands, show fingers in both visual fields, ask about deficit, left hand  0=normal, none detected. (visual loss alone)   1=neglects or extinguishes to double simult stimulation in any modality   2=profound neglect in more than one modality  0    Score    10       Was IV tPA given? No    The patient was not a tPA candidate due to delayed presentation     Advanced imaging was interpreted at   The patient was not a thrombectomy candidate due to MRI finding does not fit with LVO     Dysphagia screen:  Did Patient Pass The Swallow Screen Part I?: No  If No Name of Physician Notified: Mitchell Lucero                Performed by nursing staff and passed or Failed screen by nursing staff and awaiting ST evaluation     Cardiac rhythm on presentation: NSR         Health History     Medical History:   Diagnosis Date   ??? CAD (coronary artery disease) 1997    S/P pci   ??? 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 DM (diabetes mellitus) (HCC)    ??? Hypertension    ??? Stroke (HCC)     12/2018 (first one), 01/2019 , 02/2019   ??? Tremors of nervous system      Surgical History: Procedure Laterality Date   ??? PERCUTANEOUS CORONARY INTERVENTION  1997   ??? HX BACK SURGERY      C6-7 ACDF (cervical)     Family History   Problem Relation Age of Onset   ??? Coronary Artery Disease Other         biological father's side   ??? Heart Disease Other    ??? Stroke Other      Social History     Socioeconomic History   ??? Marital status: Married     Spouse name: Mitchell Lucero   ??? Number of children: 3   ??? Years of education: Not on file   ??? Highest education level: Not on file   Occupational History   ??? Not on file   Tobacco Use   ??? Smoking status: Never Smoker   ??? Smokeless tobacco: Former Neurosurgeon     Types: Chew   Substance and Sexual Activity   ??? Alcohol use: Yes     Comment: occasional   ??? Drug use: Never   ??? Sexual activity: Not on file   Other Topics Concern   ??? Not on file   Social History Narrative    - Anterior cervical discectomy and fusion in 1997    - Used to work building metal tanks prior to retiring        Medications:      PRN Medications:         Physical Exam     HEENT: normocephalic  CV: regular rate, distal pulses palpable  Chest: normal  configuration, equal chest rise bilaterally  Ab: soft    Extended Neuro Exam:    Mental status: alert, oriented to person only   Speech:    Normal Abnormal   Fluency x    Comprehension x    Articulation x    Repetition x    Naming x        Cranial Nerves:    Normal Abnormal   II x    III, IV, VI x    V x    VII                         x                             VIII  Mild left facial droop   IX, X x    XI x    XII x        Muscle/motor:   Tone: nml  Bulk: nml  Fasciculations: none  Pronator drift: none  Asterixis noticed LUE>RUE     NF NE SA EF EE WE WF FF FE FA TA HF HA HE KF KE DF PF   R   5 5 5       4  4 4 4 5 5    L   5 5 5       4  4 4 4 5 5        Sensation:    Normal RUE LUE RLE LLE   Light Touch x       Pin Prick x       Temperature        Vibration        Proprioception            Coordination:    Normal Abnormal Right Abnormal Left Finger to Nose  ataxia Mild ataxia   Rapid alternating       Heel to Shin -     Finger tap      Foot tap        Gait and Sation:  Deferred          Lab/Radiology/Other Diagnostic Tests:  24-hour labs:    Results for orders placed or performed during the hospital encounter of 03/09/19 (from the past 24 hour(s))   COMPREHENSIVE METABOLIC PANEL    Collection Time: 03/09/19  6:02 PM   Result Value Ref Range    Sodium 137 137 - 147 MMOL/L    Potassium 3.6 3.5 - 5.1 MMOL/L    Chloride 102 98 - 110 MMOL/L    Glucose 158 (H) 70 - 100 MG/DL    Blood Urea Nitrogen 12 7 - 25 MG/DL    Creatinine 8.11 0.4 - 1.24 MG/DL    Calcium 8.4 (L) 8.5 - 10.6 MG/DL    Total Protein 6.0 6.0 - 8.0 G/DL    Total Bilirubin 1.5 (H) 0.3 - 1.2 MG/DL    Albumin 2.5 (L) 3.5 - 5.0 G/DL    Alk Phosphatase 914 (H) 25 - 110 U/L    AST (SGOT) 28 7 - 40 U/L    CO2 30 21 - 30 MMOL/L    ALT (SGPT) 23 7 - 56 U/L    Anion Gap 5 3 - 12    eGFR Non African American >60 >60 mL/min    eGFR African American >60 >60 mL/min   CBC AND DIFF  Collection Time: 03/09/19  6:02 PM   Result Value Ref Range    White Blood Cells 6.9 4.5 - 11.0 K/UL    RBC 4.11 (L) 4.4 - 5.5 M/UL    Hemoglobin 12.4 (L) 13.5 - 16.5 GM/DL    Hematocrit 16.1 (L) 40 - 50 %    MCV 88.9 80 - 100 FL    MCH 30.1 26 - 34 PG    MCHC 33.9 32.0 - 36.0 G/DL    RDW 09.6 11 - 15 %    Platelet Count 149 (L) 150 - 400 K/UL    MPV 7.2 7 - 11 FL    Neutrophils 50 41 - 77 %    Lymphocytes 35 24 - 44 %    Monocytes 9 4 - 12 %    Eosinophils 5 0 - 5 %    Basophils 1 0 - 2 %    Absolute Neutrophil Count 3.40 1.8 - 7.0 K/UL    Absolute Lymph Count 2.43 1.0 - 4.8 K/UL    Absolute Monocyte Count 0.63 0 - 0.80 K/UL    Absolute Eosinophil Count 0.35 0 - 0.45 K/UL    Absolute Basophil Count 0.06 0 - 0.20 K/UL    MDW (Monocyte Distribution Width) 20.6 <20.7   PROTIME INR (PT)    Collection Time: 03/09/19  6:02 PM   Result Value Ref Range    INR 1.2 0.8 - 1.2   PTT (APTT)    Collection Time: 03/09/19  6:02 PM Result Value Ref Range    APTT 33.4 24.0 - 36.5 SEC   CREATINE KINASE-CPK    Collection Time: 03/09/19  6:02 PM   Result Value Ref Range    Creatine Kinase 115 35 - 232 U/L   MAGNESIUM    Collection Time: 03/09/19  6:02 PM   Result Value Ref Range    Magnesium 1.9 1.6 - 2.6 mg/dL   POC TROPONIN    Collection Time: 03/09/19  7:03 PM   Result Value Ref Range    Troponin-I-POC 0.00 0.00 - 0.05 NG/ML   POC GLUCOSE    Collection Time: 03/09/19  7:05 PM   Result Value Ref Range    Glucose, POC 154 (H) 70 - 100 MG/DL   POC CREATININE, RAD    Collection Time: 03/09/19  7:05 PM   Result Value Ref Range    Creatinine, POC 0.7 0.4 - 1.24 MG/DL   POC PT/INR    Collection Time: 03/09/19  7:05 PM   Result Value Ref Range    INR POC 1.1 0.8 - 1.2   AMPHETAMINES-URINE RANDOM    Collection Time: 03/09/19  8:30 PM   Result Value Ref Range    Amphetamines NEG NEG-NEG   BARBITURATES-URINE RANDOM    Collection Time: 03/09/19  8:30 PM   Result Value Ref Range    Barbiturates,Urine NEG NEG-NEG   BENZODIAZEPINES-URINE RANDOM    Collection Time: 03/09/19  8:30 PM   Result Value Ref Range    Benzodiazepines NEG NEG-NEG   CANNABINOIDS-URINE RANDOM    Collection Time: 03/09/19  8:30 PM   Result Value Ref Range    THC NEG NEG-NEG   COCAINE-URINE RANDOM    Collection Time: 03/09/19  8:30 PM   Result Value Ref Range    Cocaine-Urine NEG NEG-NEG   OPIATES-URINE RANDOM    Collection Time: 03/09/19  8:30 PM   Result Value Ref Range    Opiates-Urine NEG NEG-NEG   PHENCYCLIDINES-URINE RANDOM  Collection Time: 03/09/19  8:30 PM   Result Value Ref Range    Phencyclidine (PCP) NEG NEG-NEG   URINALYSIS DIPSTICK    Collection Time: 03/09/19  8:30 PM   Result Value Ref Range    Color,UA AMBER     Turbidity,UA CLEAR CLEAR-CLEAR    Specific Gravity-Urine 1.014 1.003 - 1.035    pH,UA 6.0 5.0 - 8.0    Protein,UA 1+ (A) NEG-NEG    Glucose,UA NEG NEG-NEG    Ketones,UA NEG NEG-NEG    Bilirubin,UA NEG NEG-NEG    Blood,UA 3+ (A) NEG-NEG Urobilinogen,UA INCREASED (A) NORM-NORMAL    Nitrite,UA NEG NEG-NEG    Leukocytes,UA NEG NEG-NEG    Urine Ascorbic Acid, UA NEG NEG-NEG   URINALYSIS, MICROSCOPIC    Collection Time: 03/09/19  8:30 PM   Result Value Ref Range    WBCs,UA 2-10 0 - 2 /HPF    RBCs,UA PACKED 0 - 3 /HPF   AMMONIA    Collection Time: 03/09/19  8:47 PM   Result Value Ref Range    Ammonia 131 (H) 9 - 35 MCMOL/L   COVID-19 (SARS-COV-2) PCR    Collection Time: 03/09/19  8:54 PM    Specimen: Nasopharyngeal Swab   Result Value Ref Range    COVID-19 (SARS-CoV-2) PCR Source NASOPHARYNGEAL SWAB     COVID-19 (SARS-CoV-2) PCR NOT DETECTED DN-NOT DETECTED   POC LACTATE    Collection Time: 03/09/19  8:54 PM   Result Value Ref Range    LACTIC ACID POC 1.1 0.5 - 2.0 MMOL/L   ALPHA FETO PROTEIN (AFP)    Collection Time: 03/09/19 10:15 PM   Result Value Ref Range    Alpha Feto Protein 2.0 0.0 - 15.0 NG/ML   POC GLUCOSE    Collection Time: 03/10/19  5:39 AM   Result Value Ref Range    Glucose, POC 96 70 - 100 MG/DL   ALPHA FETO PROTEIN (AFP)    Collection Time: 03/10/19  5:45 AM   Result Value Ref Range    Alpha Feto Protein 2.1 0.0 - 15.0 NG/ML   PTT (APTT)    Collection Time: 03/10/19  5:45 AM   Result Value Ref Range    APTT 32.2 24.0 - 36.5 SEC   BNP (B-TYPE NATRIURETIC PEPTI)    Collection Time: 03/10/19  5:45 AM   Result Value Ref Range    B Type Natriuretic Peptide 58.0 0 - 100 PG/ML   LACTIC ACID(LACTATE)    Collection Time: 03/10/19  5:45 AM   Result Value Ref Range    Lactic Acid 0.8 0.5 - 2.0 MMOL/L   POC GLUCOSE    Collection Time: 03/10/19  7:45 AM   Result Value Ref Range    Glucose, POC 109 (H) 70 - 100 MG/DL     Glucose: (!) 161 (09/60/45 1802)  POC Glucose (Download): (!) 109 (03/10/19 0745)  Pertinent radiology reviewed.      Mitchell Gottron, MD.  Neurology, PGY-2  Pager number [phone]  Available on [facility]

## 2019-03-10 NOTE — Other
Immediate Post Procedure Note    Date:  03/10/2019                                         Attending Physician:   Earvin Hansen  Performing Provider:  Rhunette Croft, MD    Consent:  Consent obtained from patient.  Time out performed: Consent obtained, correct patient verified, correct procedure verified, correct site verified, patient marked as necessary.  Pre/Post Procedure Diagnosis:  ascites  Indications:  ascites    Anesthesia: Local lidocaine  Procedure(s):  Paracentesis  Findings:  Straw colored fluid.      Estimated Blood Loss:  None/Negligible  Specimen(s) Removed/Disposition:  Yes, sent to pathology  Complications: None  Patient Tolerated Procedure: Well  Post-Procedure Condition:  stable    Rhunette Croft, MD  Pager [phone]

## 2019-03-10 NOTE — Progress Notes
Patient arrived to room # [phone] via cart accompanied by transport. Patient transferred to the bed with assistance. Bedside safety checks completed. Initial patient assessment completed. Refer to flowsheet for details.    Admission skin assessment completed with: Leodis Sias, RN and Laurine Blazer, RN    Pressure injury present on arrival?: Yes    1. Head/Face/Neck: No  2. Trunk/Back: No  3. Upper Extremities: No  4. Lower Extremities: No  5. Pelvic/Coccyx: Yes, stage 1 on coccyx  6. Assessed for device associated injury? Yes  7. Malnutrition Screening Tool (Nursing Nutrition Assessment) Completed? Yes    See Doc Flowsheet for additional wound details.     INTERVENTIONS:   Criticaid barrier cream and Q2 turns

## 2019-03-10 NOTE — Consults
Interventional Radiology Consult Note with Pre-procedural History and Physical    Admission Date: 03/09/2019  LOS: 1 day                       Principal Problem:    Acute encephalopathy      Reason for consult: Ascites. Evaluate for paracentesis.    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  -   Platelet Count   Date Value Ref Range Status   03/09/2019 149 (L) 150 - 400 K/UL Final   ;   INR   Date Value Ref Range Status   03/09/2019 1.2 0.8 - 1.2 Final     INR POC   Date Value Ref Range Status   03/09/2019 1.1 0.8 - 1.2 Final       Plan:  - Will proceed with paracentesis.  - Does not need to be kept NPO for this procedure, we use a local anesthetic only.    Procedure: US guided paracentesis       IR Pre Procedure Notes:  Consent w UC.   __________________________________________________________________    Chief Complaint:  Ascites    Previous Anesthetic/Sedation History:  N/A-local anesthetic    Code Status: Full Code    History of present illness:  Mitchell Lucero is a 68 y.o. 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 IR consulted for image-guided paracentesis. See ROS below for current symptoms.     Review of Systems  Constitutional: negative for fevers and chills  Respiratory: negative for cough or dyspnea  Gastrointestinal: negative for nausea, vomiting or abdominal pain    Medications  Scheduled Meds:aspirin EC tablet 81 mg, 81 mg, Oral, QAM8  atorvastatin (LIPITOR) tablet 80 mg, 80 mg, Oral, QHS  citalopram (CeleXA) tablet 20 mg, 20 mg, Oral, QAM8  dextran 70/hypromellose (GENTEAL TEARS) ophthalmic solution 1 drop, 1 drop, Both Eyes, TID enoxaparin (LOVENOX) syringe 40 mg, 40 mg, Subcutaneous, QDAY(21)  insulin aspart U-100 (NOVOLOG FLEXPEN) injection PEN 0-6 Units, 0-6 Units, Subcutaneous, ACHS (22)  insulin glargine (LANTUS SOLOSTAR) injection PEN 5 Units, 5 Units, Subcutaneous, QHS  lactulose oral solution 20 g, 30 mL, Oral, Q4H    Or  lactulose(#) 200 g/300 mL rectal enema 200 g, 200 g, Rectal, Q4H  latanoprost (XALATAN) 0.005 % ophthalmic solution 1 drop, 1 drop, Both Eyes, QHS  melatonin tablet 3 mg, 3 mg, Oral, QHS  midodrine (PROAMATINE) tablet 5 mg, 5 mg, Oral, TID  pantoprazole DR (PROTONIX) tablet 20 mg, 20 mg, Oral, QDAY    Continuous Infusions:  PRN and Respiratory Meds:acetaminophen Q6H PRN, bisacodyL BID PRN, prochlorperazine Q6H PRN **OR** prochlorperazine Q6H PRN      Objective                       Vital Signs: Last Filed                 Vital Signs: 24 Hour Range   BP: 155/83 (08/27 0720)  Temp: 36.9 ???C (98.4 ???F) (08/27 0720)  Pulse: 78 (08/27 0720)  Respirations: 18 PER MINUTE (08/27 0720)  SpO2: 100 % (08/27 0720)  SpO2 Pulse:  78 (08/27 0649)  Height: 160 cm (63) (08/26 1749) BP: (114-162)/(79-98)   Temp:  [36.6 ???C (97.8 ???F)-36.9 ???C (98.4 ???F)]   Pulse:  [71-87]   Respirations:  [14 PER MINUTE-22 PER MINUTE]   SpO2:  [94 %-100 %]      Vitals:    03/09/19 1749   Weight: 70.3 kg (155 lb)         Intake/Output Summary:  (Last 24 hours)    Intake/Output Summary (Last 24 hours) at 03/10/2019 1610  Last data filed at 03/09/2019 2130  Gross per 24 hour   Intake ???   Output 300 ml   Net -300 ml           Physical Exam  General appearance: lethargic  Neurologic: slow to respond  Lungs: Nonlabored with normal effort  Abdomen: soft, non-distended, nontender    Anesthesia Classification:  N/A-local anesthetic  Intra-procedural Sedation/Medication Plan: Lidocaine  Discussion/Reviews:  Physician has discussed risks and alternatives of this type of sedation and above planned procedures with patient  NPO Status: N/A-local anesthestic Pregnancy Status: N/A-local anesthetic and U/S guided    Lab/Radiology/Other Diagnostic Tests:  Labs:  Pertinent labs reviewed  Radiology: Reviewed.         We appreciate being able to participate in this patient's care. Please page with any questions or concerns.    Toya Smothers, MSN,APRN   Pgr [phone]    IR Team Pager [phone] (After-hours and Weekends)

## 2019-03-10 NOTE — Progress Notes
SPEECH-LANGUAGE PATHOLOGY  NO TREATMENT NOTE     Orders received for bedside swallow evaluation. Pt off unit in IR at time of visit. Discussed w/ RN w/ plans to follow up once pt returns.     Therapist: Emeterio Reeve M.A. CCC-L/SLP Voalte: [id number]  Date: 03/10/2019

## 2019-03-10 NOTE — Progress Notes
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 Patient admitted to medicine for hepatic encephalopathy, progression/acute stroke.   ???  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 LIRADS 5. Cirrhosis with portal hypertension, including mild splenomegaly,  portosystemic varices, and small volume ascites. Mild peritoneal thickening and enhancement, which may reflect  infectious peritonitis. Metastatic disease could appear similar. Prominent periportal, portacaval, and epiphrenic lymph nodes, which are indeterminate and may be reactive or metastatic. Cholelithiasis.   - Tbili 1.5, albumin 2.5, Alk phos 200, AST/ALT WNL, INR 1.2         - Afebrile, no leukocytosis  - CXR showed improvement in patchy bibasilar opacities, now minimal, likely atelectasis. No new acute cardiopulmonary findings.    - UDS negative.  - UA w/o evidence of infection but with hematuria  - Paracentesis with no evidence of SBP  - Abd U/S w/ dopplers showed patent hepatic vessels with normal direction of blood flow. 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 There are 2 smaller observations elsewhere in the liver which are subthreshold for imaging characterization, possibly small regenerative nodules. These can be reevaluated on surveillance CT/MR studies for known hepatoma. Portal hypertension, with mild splenomegaly and small volume ascites.???Cholelithiasis and gallbladder sludge. Circumferential bladder wall thickening, which is nonspecific though often related to chronic bladder outlet obstruction or cystitis.   - PVR bladder scan  MELD-Na score: 9 at 03/09/2019  7:05 PM  MELD score: 9 at 03/09/2019  7:05 PM  Calculated from:  Serum Creatinine: 0.7 MG/DL (Rounded to 1 MG/DL) at 1/61/0960  4:54 PM  Serum Sodium: 137 MMOL/L at 03/09/2019  6:02 PM  Total Bilirubin: 1.5 MG/DL at 0/98/1191  4:78 PM  INR(ratio): 1.1 at 03/09/2019  7:05 PM  Age: 68 years 3 months  - At this point of time suspected patient's mental status changes/lethargy is related to stroke + hepatic encephalopathy.  PLAN:   - Hepatology consulted  - Follow up blood cultures  - Continue Lactulose PO and enema q4h prn, titrate 3-4 bowel movements per day  - Hold Baclofen    Recurrent strokes w/ progression of now moderate acute-early subacute right superior cerebellar-brachium pontis infarcts (right SCA territory) on 03/09/2019 MRI  Dysarthria, gait imbalance, weakness  - MRI Head on 8/26 showed progression of now moderate acute-early subacute right superior cerebellar-brachium pontis infarcts (right SCA territory). New tiny acute or early subacute right tectal-superior cerebellar peduncle infarct. No hemorrhagic conversion or significant associated mass effect. Expected evolution of late subacute-chronic left lateral pontine infarct with wallerian degeneration in the left brachium pontis. Persistent mild generalized cerebral volume loss and marked  nonspecific supratentorial white matter disease, likely due to chronic small vessel ischemic changes.   - CT Head 8/26 showed redemonstration of recent right superior cerebellar artery territory infarct. The new small area of infarct (on same-day MRI) in the right superior cerebellar peduncle and tectum is of similar density to the adjacent SCA territory infarct and is also likely subacute. Redemonstration of subacute to chronic left pontine infarct with evolving wallerian degeneration of the left brachium pontis. Moderate to marked nonspecific white matter disease, likely due to chronic microvascular ischemia. No acute intracranial hemorrhage.   - Patient sent to ED from [facility] after findings of new stroke on imaging  - Stroke activated in the ED but not tPA candidate  PLAN:  - Neurology following  - Allow for permissive hypertension in first 48-72 hours, SBP goal 160 - 220 mmHg and DBP 70-110. IVF prn to maintain this goal  - Telemetry to monitor for arrhythmia   - Continue DAPT with ASA and Plavix. Checking plt inhibition test to ensure Plavix is effective  - PT/OT/SLP consulted  ???  IDDM  - Continue PTA Lantus and continue LCDF    History of orthostatic hypotension  - Continue PTA Midodrine  ???  CAD s/p PCI in 1997  HLD  - Denies chest pain, shortness of breath. Denies stress test since 1997.  PLAN:  - Continue DAPT for CVA as noted above  - Continue statin  ???  Depression  - Continue PTA Celexa    Inadequate oral intake  - Dietitian consulted    Bicytopenia (anemia and thrombocytopenia)  - Stable, continue to monitor    FEN: Regular diet  Ppx: Lovenox  FULL CODE  Dispo: Continue inpatient admission  ________________________________________________________________________    Subjective  The patient notes he is feeling better today. He is oriented to person only. He knew he had another stroke. Wife at bedside. The patient denies f/c, CP, SOA, pain. His wife notes he has had very poor appetite and has not wanted to drink. He does not like the Lactulose and reports nausea. He has had multiple bowel movements and okay with using the enemas. Brought up idea of palliative care and hospice to patient and his wife. She notes this is likely but would like to see if he can progress with PT/OT and see if his mentation improves with current treatments.    Medications  Scheduled Meds:aspirin EC tablet 81 mg, 81 mg, Oral, QAM8  atorvastatin (LIPITOR) tablet 80 mg, 80 mg, Oral, QHS  citalopram (CeleXA) tablet 20 mg, 20 mg, Oral, QAM8  dextran 70/hypromellose (GENTEAL TEARS) ophthalmic solution 1 drop, 1 drop, Both Eyes, TID  enoxaparin (LOVENOX) syringe 40 mg, 40 mg, Subcutaneous, QDAY(21)  insulin aspart U-100 (NOVOLOG FLEXPEN) injection PEN 0-6 Units, 0-6 Units, Subcutaneous, ACHS (22)  insulin glargine (LANTUS SOLOSTAR) injection PEN 5 Units, 5 Units, Subcutaneous, QHS  lactulose oral solution 20 g, 30 mL, Oral, Q4H    Or  lactulose(#) 200 g/300 mL rectal enema 200 g, 200 g, Rectal, Q4H  latanoprost (XALATAN) 0.005 % ophthalmic solution 1 drop, 1 drop, Both Eyes, QHS  melatonin tablet 3 mg, 3 mg, Oral, QHS  midodrine (PROAMATINE) tablet 5 mg, 5 mg, Oral, TID  pantoprazole DR (PROTONIX) tablet 20 mg, 20 mg, Oral, QDAY    Continuous Infusions:  PRN and Respiratory Meds:acetaminophen Q6H PRN, bisacodyL BID PRN, prochlorperazine Q6H PRN **OR** prochlorperazine Q6H PRN      Review of Systems:  A 14 point review of systems  was negative except as documented in the above subjective section of note.       Objective:                          Vital Signs: Last Filed                 Vital Signs: 24 Hour Range   BP: 155/83 (08/27 0720)  Temp: 36.9 ???C (98.4 ???F) (08/27 0720)  Pulse: 82 (08/27 0914)  Respirations: 16 PER MINUTE (08/27 0914)  SpO2: 98 % (08/27 0914)  SpO2 Pulse: 78 (08/27 0649)  Height: 160 cm (63) (08/26 1749) BP: (114-162)/(79-98)   Temp:  [36.6 ???C (97.8 ???F)-36.9 ???C (98.4 ???F)]   Pulse:  [71-87]   Respirations:  [14 PER MINUTE-22 PER MINUTE]   SpO2:  [94 %-100 %]      Vitals:    03/09/19 1749   Weight: 70.3 kg (155 lb) Intake/Output Summary:  (Last 24 hours)    Intake/Output Summary (Last 24 hours) at 03/10/2019 1610  Last data filed at 03/09/2019 2130  Gross per 24 hour   Intake ???   Output 300 ml   Net -300 ml           Physical Exam  General:  Alert, cooperative, no distress, appears stated age  Head:  Normocephalic, without obvious abnormality, atraumatic  Eyes:  Conjunctivae/corneas clear. PERRL, EOMs intact.   Neck:    Supple, symmetrical, trachea midline  Lungs:  Clear to auscultation bilaterally  Heart:   Regular rate and rhythm  Abdomen:  Soft, non-tender. Bowel sounds normal.   Extremities: Extremities normal, atraumatic, no cyanosis or edema  Skin: Skin color, texture, turgor normal. No rashes or lesions  Neurologic:  Alert, oriented to person only and understands he is here because he had a stroke. 1/5 strength b/l LEs, 3/5 strength b/l UEs, mild asterixis, slurred speech, R facial droop      Lab Review  24-hour labs:    Results for orders placed or performed during the hospital encounter of 03/09/19 (from the past 24 hour(s))   COMPREHENSIVE METABOLIC PANEL    Collection Time: 03/09/19  6:02 PM   Result Value Ref Range    Sodium 137 137 - 147 MMOL/L    Potassium 3.6 3.5 - 5.1 MMOL/L    Chloride 102 98 - 110 MMOL/L    Glucose 158 (H) 70 - 100 MG/DL    Blood Urea Nitrogen 12 7 - 25 MG/DL    Creatinine 9.60 0.4 - 1.24 MG/DL    Calcium 8.4 (L) 8.5 - 10.6 MG/DL    Total Protein 6.0 6.0 - 8.0 G/DL    Total Bilirubin 1.5 (H) 0.3 - 1.2 MG/DL    Albumin 2.5 (L) 3.5 - 5.0 G/DL    Alk Phosphatase 454 (H) 25 - 110 U/L    AST (SGOT) 28 7 - 40 U/L    CO2 30 21 - 30 MMOL/L    ALT (SGPT) 23 7 - 56 U/L    Anion Gap 5 3 - 12    eGFR Non African American >60 >60 mL/min    eGFR African American >60 >60 mL/min   CBC AND DIFF    Collection Time: 03/09/19  6:02 PM   Result Value Ref Range    White Blood Cells 6.9 4.5 - 11.0 K/UL    RBC 4.11 (L) 4.4 - 5.5 M/UL    Hemoglobin 12.4 (L) 13.5 - 16.5  GM/DL    Hematocrit 16.1 (L) 40 - 50 % MCV 88.9 80 - 100 FL    MCH 30.1 26 - 34 PG    MCHC 33.9 32.0 - 36.0 G/DL    RDW 09.6 11 - 15 %    Platelet Count 149 (L) 150 - 400 K/UL    MPV 7.2 7 - 11 FL    Neutrophils 50 41 - 77 %    Lymphocytes 35 24 - 44 %    Monocytes 9 4 - 12 %    Eosinophils 5 0 - 5 %    Basophils 1 0 - 2 %    Absolute Neutrophil Count 3.40 1.8 - 7.0 K/UL    Absolute Lymph Count 2.43 1.0 - 4.8 K/UL    Absolute Monocyte Count 0.63 0 - 0.80 K/UL    Absolute Eosinophil Count 0.35 0 - 0.45 K/UL    Absolute Basophil Count 0.06 0 - 0.20 K/UL    MDW (Monocyte Distribution Width) 20.6 <20.7   PROTIME INR (PT)    Collection Time: 03/09/19  6:02 PM   Result Value Ref Range    INR 1.2 0.8 - 1.2   PTT (APTT)    Collection Time: 03/09/19  6:02 PM   Result Value Ref Range    APTT 33.4 24.0 - 36.5 SEC   CREATINE KINASE-CPK    Collection Time: 03/09/19  6:02 PM   Result Value Ref Range    Creatine Kinase 115 35 - 232 U/L   MAGNESIUM    Collection Time: 03/09/19  6:02 PM   Result Value Ref Range    Magnesium 1.9 1.6 - 2.6 mg/dL   POC TROPONIN    Collection Time: 03/09/19  7:03 PM   Result Value Ref Range    Troponin-I-POC 0.00 0.00 - 0.05 NG/ML   POC GLUCOSE    Collection Time: 03/09/19  7:05 PM   Result Value Ref Range    Glucose, POC 154 (H) 70 - 100 MG/DL   POC CREATININE, RAD    Collection Time: 03/09/19  7:05 PM   Result Value Ref Range    Creatinine, POC 0.7 0.4 - 1.24 MG/DL   POC PT/INR    Collection Time: 03/09/19  7:05 PM   Result Value Ref Range    INR POC 1.1 0.8 - 1.2   AMPHETAMINES-URINE RANDOM    Collection Time: 03/09/19  8:30 PM   Result Value Ref Range    Amphetamines NEG NEG-NEG   BARBITURATES-URINE RANDOM    Collection Time: 03/09/19  8:30 PM   Result Value Ref Range    Barbiturates,Urine NEG NEG-NEG   BENZODIAZEPINES-URINE RANDOM    Collection Time: 03/09/19  8:30 PM   Result Value Ref Range    Benzodiazepines NEG NEG-NEG   CANNABINOIDS-URINE RANDOM    Collection Time: 03/09/19  8:30 PM   Result Value Ref Range    THC NEG NEG-NEG COCAINE-URINE RANDOM    Collection Time: 03/09/19  8:30 PM   Result Value Ref Range    Cocaine-Urine NEG NEG-NEG   OPIATES-URINE RANDOM    Collection Time: 03/09/19  8:30 PM   Result Value Ref Range    Opiates-Urine NEG NEG-NEG   PHENCYCLIDINES-URINE RANDOM    Collection Time: 03/09/19  8:30 PM   Result Value Ref Range    Phencyclidine (PCP) NEG NEG-NEG   URINALYSIS DIPSTICK    Collection Time: 03/09/19  8:30 PM   Result Value Ref Range    Color,UA AMBER  Turbidity,UA CLEAR CLEAR-CLEAR    Specific Gravity-Urine 1.014 1.003 - 1.035    pH,UA 6.0 5.0 - 8.0    Protein,UA 1+ (A) NEG-NEG    Glucose,UA NEG NEG-NEG    Ketones,UA NEG NEG-NEG    Bilirubin,UA NEG NEG-NEG    Blood,UA 3+ (A) NEG-NEG    Urobilinogen,UA INCREASED (A) NORM-NORMAL    Nitrite,UA NEG NEG-NEG    Leukocytes,UA NEG NEG-NEG    Urine Ascorbic Acid, UA NEG NEG-NEG   URINALYSIS, MICROSCOPIC    Collection Time: 03/09/19  8:30 PM   Result Value Ref Range    WBCs,UA 2-10 0 - 2 /HPF    RBCs,UA PACKED 0 - 3 /HPF   AMMONIA    Collection Time: 03/09/19  8:47 PM   Result Value Ref Range    Ammonia 131 (H) 9 - 35 MCMOL/L   COVID-19 (SARS-COV-2) PCR    Collection Time: 03/09/19  8:54 PM    Specimen: Nasopharyngeal Swab   Result Value Ref Range    COVID-19 (SARS-CoV-2) PCR Source NASOPHARYNGEAL SWAB     COVID-19 (SARS-CoV-2) PCR NOT DETECTED DN-NOT DETECTED   POC LACTATE    Collection Time: 03/09/19  8:54 PM   Result Value Ref Range    LACTIC ACID POC 1.1 0.5 - 2.0 MMOL/L   ALPHA FETO PROTEIN (AFP)    Collection Time: 03/09/19 10:15 PM   Result Value Ref Range    Alpha Feto Protein 2.0 0.0 - 15.0 NG/ML   POC GLUCOSE    Collection Time: 03/10/19  5:39 AM   Result Value Ref Range    Glucose, POC 96 70 - 100 MG/DL   ALPHA FETO PROTEIN (AFP)    Collection Time: 03/10/19  5:45 AM   Result Value Ref Range    Alpha Feto Protein 2.1 0.0 - 15.0 NG/ML   PTT (APTT)    Collection Time: 03/10/19  5:45 AM   Result Value Ref Range    APTT 32.2 24.0 - 36.5 SEC BNP (B-TYPE NATRIURETIC PEPTI)    Collection Time: 03/10/19  5:45 AM   Result Value Ref Range    B Type Natriuretic Peptide 58.0 0 - 100 PG/ML   LACTIC ACID(LACTATE)    Collection Time: 03/10/19  5:45 AM   Result Value Ref Range    Lactic Acid 0.8 0.5 - 2.0 MMOL/L   POC GLUCOSE    Collection Time: 03/10/19  7:45 AM   Result Value Ref Range    Glucose, POC 109 (H) 70 - 100 MG/DL       Point of Care Testing  (Last 24 hours)  Glucose: (!) 158 (03/09/19 1802)  POC Glucose (Download): (!) 109 (03/10/19 0745)    Radiology and other Diagnostics Review:    Pertinent radiology reviewed.    Steffanie Dunn, DO   Pager [phone]

## 2019-03-10 NOTE — Consults
ATTESTATION    I personally performed the key portions of the E/M visit, discussed case with the fellow and concur with documentation of history, physical exam, assessment, and treatment plan unless otherwise noted.    68 years old gentleman who was recently diagnosed with cirrhosis in July 2020 thought to be due to [place]. He was also recently diagnosed with hepatocellular carcinoma with plans for outpatient locoregional treatment.  At time of diagnosis he did not have ascites, variceal hemorrhage or hepatic encephalopathy.    Patient was discharged to rehabilitation in late July 2020.  He was readmitted in August for an acute infarct and discharged to rehabilitation 02/23/2019.  In 03/09/2019, the patient developed altered mental status again.  MRI brain showed new areas of stroke and progression of old infarcts.    Wife present at bedside with appropriate questions about overall prognosis    Physical exam:  Comfortable in bed.  Minimally conversational  Alert and oriented x2  Tremors but no asterixis    MELD-Na score: 9 at 03/09/2019  7:05 PM  MELD score: 9 at 03/09/2019  7:05 PM  Calculated from:  Serum Creatinine: 0.7 MG/DL (Rounded to 1 MG/DL) at 1/61/0960  4:54 PM  Serum Sodium: 137 MMOL/L at 03/09/2019  6:02 PM  Total Bilirubin: 1.5 MG/DL at 0/98/1191  4:78 PM  INR(ratio): 1.1 at 03/09/2019  7:05 PM  Age: 51 years 3 months    Impression/Assessment/Plan/Recommendations:      1. Altered mental status in the setting of acute infarct and cirrhosis  2. Cirrhosis without prior history of ascites, variceal hemorrhage or hepatic encephalopathy  3. Elevated ammonia  4. Hepatocellular carcinoma      ? With the obvious evidence of acute infarction this is likely the cause of his altered mental status rather than hepatic encephalopathy.  Random venous ammonia level is generally unreliable in the setting of known chronic liver disease.  ? I had a long discussion with the wife who would like to have a global picture of his overall prognosis and possibly discuss goals of care  ? Explained to the patient wife that from a liver standpoint, treating his hepatocellular carcinoma would not affect his overall short-term prognosis given his recurrent strokes    Jan Fireman, MD  Gastroenterology and Transplant Hepatology  03/10/2019

## 2019-03-10 NOTE — Response Teams
Rapid Response Team Progress Note    Date: 03/09/2019 Time: 7:01 PM  Patient: Mitchell Lucero  Attending: Dorna Leitz, DO Service: Emergency Medicine  Admission Date: 03/09/2019  LOS: 0 days    A Code/Rapid Response Timeline Event Report has been created for this patient on 03/09/2019 at 1656. Patient was currently admitted at [place] inpatient rehab facility and was at main hospital getting an MRI due to new altered mental status. RRT called to MRI after procedure was completed. Per the MRI tech, the Rehab MD, Dr. Louanne Skye, requested that patient was taken to the ED for further evaluation. Patient has a history of strokes in the past. Patient taken to ED 32 for further management. VSS throughout.       Anthonette Legato, RN

## 2019-03-10 NOTE — Telephone Encounter
Patient readmitted to the hospital with another stroke. Will continue to follow.     Dorris Carnes, RN BSN

## 2019-03-10 NOTE — Progress Notes
SPEECH-LANGUAGE PATHOLOGY  CLINICAL SWALLOW ASSESSMENT     EVALUATION SUMMARY  Summary: A clinical swallow evaluation was completed this date. Results suggest swallow function to be [facility]. No s/s of aspiration noted with PO trials today. Pt endorses worsening of dysarthria. Pt familiar to this SLP from evaluation following prior CVA. Pt/spouse report good tolerance of PO intake while on rehabilitation unit without s/s of aspiration. Reviewed results/recommendations with pt/spouse below, along with encouragement to notify staff should s/s of aspiration occur. Please see details below.    RECOMMENDATIONS  Regular solids and thin liquids  Medications PO as tolerated   Excellent oral care (2-3x/daily) to reduce risk of aspiration of bacteria from the oral cavity  SLP department will f/u to ensure diet tolerance and completion of further evaluation as indicated    Oral Stage Summary*: PO trials of ice chips, thin liquids (via tsp/cup/straw), purees, mechanical soft, and regular solids provided. Bolus withdraw, transfer, and mastication appeared [facility]. No anterior bolus loss nor oral residue. Pt did attempt to masticate purees.     Pharyngeal Stage Summary*: Suspect risk for mistiming of the oropharyngeal swallow; though no s/s of aspiration noted.    Swallow Recommendations*  PO: Regular, Thin Liquids  Swallow Strategies: Small Bites/Sips, Slow Rate of Intake    Plan: Continue Treatment __x/week (Comment).(3-5x)    Prognosis: Good  NOMS Dysphagia Rating: 7-Normal -Ability to eat indep not limited by swallow function. Swallow safe/efficient for all consistencies. Compensatory strategies effectively used when needed.  Results Reported to Physician: Yes    Objective: Mitchell Lucero is a 68 y.o. 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 Patient noted to have waxing and waning mental status at rehab facility through his stay there but this morning he was noted to have decline in functioning so decision was made to obtain STAT MRI which showed a new tiny area of acute or early subacute stroke in right tectal-superior cerebellar peduncle infarct.    CHEST SINGLE VIEW 8/26: IMPRESSION   Improvement in patchy bibasilar opacities, now minimal, likely   atelectasis. No new acute cardiopulmonary findings.     CT HEAD WO CONTRAST: IMPRESSION   1. ???Redemonstration of recent right superior cerebellar artery territory   infarct. The new small area of infarct (on same-day MRI) in the right   superior cerebellar peduncle and tectum is of similar density to the   adjacent SCA territory infarct and is also likely subacute.   2. ???Redemonstration of subacute to chronic left pontine infarct with   evolving wallerian degeneration of the left brachium pontis.   3. ???Moderate to marked nonspecific white matter disease, likely due to   chronic microvascular ischemia.   4. ???No acute intracranial hemorrhage.     MRI HEAD WO CONTRAST: IMPRESSION   1. ???Progression of now moderate acute-early subacute right superior   cerebellar-brachium pontis infarcts (right SCA territory). New tiny acute   or early subacute right tectal-superior cerebellar peduncle infarct. No   hemorrhagic conversion or significant associated mass effect.   2. ???Expected evolution of late subacute-chronic left lateral pontine   infarct with wallerian degeneration in the left brachium pontis.   3. ???Persistent mild generalized cerebral volume loss and marked   nonspecific supratentorial white matter disease, likely due to chronic   small vessel ischemic changes.   Psychosocial Status: Willing and Cooperative to Participate  Persons Present:  Spouse    Subjective*  Pain: Patient has no complaint of pain  Pain Level Current*: No pain  Trach Presence: No Feeding Tube Present During Eval: None    Nutrition*  Nutrition Prior To Hospitalization: Oral, Regular, Thin Liquids  Current Form Of Nutrition: NPO    Swallow Strategies  Small Bites and Sips: Effective  Slow Rate of Intake: Effective  Alternate Solids / Liquids: Effective    ORAL MECH EXAM  Oral Mech WFL*: No  Oral Mech Exam Summary*: Mild dysarthria present characerized by reduced articulatory precision, rate, and phonation. Missing dentition w/ present dentition noted to be in poor quality. Pt unable to produce cough/throat clear on command.    Education*  Persons Educated: Pt/Family  Barriers To Learning: Cognitive Deficits, Impaired Communication  Interventions: Haematologist Educated, Family Educated, Repetition of Instructions  Teaching Methods: Verbal, Demonstration  Topics: Dysphagia  Patient Response: Verbalized Understanding, More Instruction Required, Return Demonstration  Goal Formulation: With Pt/Family    Clinical Swallow Goals*  Goal : Pt will tolerate regular solids and thin liquids with less than 10% s/s of aspiration and negative pulmonary status changes.     Therapist: Marland Kitchen.A. CCC-L/SLP Voalte: [id number]  Date:03/10/2019

## 2019-03-10 NOTE — Progress Notes
RT Adult Assessment Note    NAME:Mitchell Lucero             MRN: [id number]             DOB:[birth year]          AGE: 68 y.o.  ADMISSION DATE: 03/09/2019             DAYS ADMITTED: LOS: 1 day    RT Treatment Plan:       Protocol Plan: Procedures  PEP Therapy: BID x 24 hours  PAP: Place a nursing order for "IS Q1h While Awake" for any of Lung Expansion indicators  Oxygen/Humidity: O2 to keep SpO2 > 92%, if not on any RT modality, D/C protocol if greater than 24 hours on room air  SpO2: PRN    Additional Comments:  Impressions of the patient: Pt resting in bed; no distress noted.  Intervention(s)/outcome(s): No interventions at this time.   Patient education that was completed: No education at this time.   Recommendations to the care team: No recommendations at this time.     Vital Signs:  Pulse: 77  RR: 19 PER MINUTE  SpO2: 100 %  O2 Device: Cannula  Liter Flow: 2 Lpm  O2%:    Breath Sounds: Clear (Implies normal)  Respiratory Effort: Non-Labored

## 2019-03-10 NOTE — Acute Stroke Response
NAME:Mitchell Lucero             MRN: [id number]             DOB:[birth year]          AGE: 68 y.o.  ADMISSION DATE: 03/09/2019             DAYS ADMITTED: LOS: 0 days    Date of Service:  03/09/2019    Allergies:  Patient has no known allergies.    Type of Acute Stroke Response Team note: Consult       Assessment & Plan     Chief Complaint: Acute onset encephalopathy  Assessment: Mitchell Lucero is a 68 y.o. 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 Patient noted to have waxing and waning mental status at rehab facility through his stay there but this morning he was noted to have decline in functioning so decision was made to obtain STAT MRI which showed a new tiny area of acute or early subacute stroke in right tectal-superior cerebellar peduncle infarct.    Stroke activated on arrival with NIH 10 for BL extremity drift     CTA 8/10 Head: Mild multifocal narrowing of the left ICA. Right A1 segment is hypoplastic with a dominant widely patent left A1 segment. There is persistent mild narrowing at the left M1 origin.moderate focal stenosis of the left vertebral artery at least mild stenosis of the right vertebral   artery.calcified plaque in the basilar artery without stenosis.     CTA 8/10 Neck: Atherosclerotic calcification  right vertebral artery origin resulting in at least mild stenosis. 50% stenosis of RICA origin by NASCET criteria. Atherosclerotic calcification at the left carotid bifurcation and proximal left internal carotid artery results in less than 50% stenosis by NASCET criteria.     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 CTA not obtained  Since area of stroke identified on MRI is not consistent with LVO. Additionally, he did not have any new findings on exam concerning for LVO at the time of stroke activation   - MRI head W/WO was completed this AM with new area of stroke   - Allow for permissive hypertension in first 48-72 hours, SBP goal 160 - 220 mmHg and DBP 70-110. IVF prn to maintain this goal  - Telemetry to monitor for arrhythmia   - Evaluation of smoking history and smoking cessation consult if tobaccoism present   - Recommend admission to medicine with stroke following given patients significant medical co morbidities.   - Continue PTA DAPT for now. From secondary prevention of stroke standpoint, patient would be a good candidate for therapeutic Lovenox even with the mild thrombocytopenia given new diagnosis of malignancy and increased risk of recurrent vascular events from hypercoagulable state. Would reccomend oncology input as well while inpatient. Patient was scheduled to see oncology as an outpatient basis to follow up. If decision made to start anticoagulation, his DAPT can be discontinued if he does not have any other indications for it.  - PT/OT/SLP consult eval and treat   - Rehab consult for assessment of post stroke care   - Encephalopathy work  up for lethargy to include ammonia level given history of cirrhosis     FEN: NPO until speech eval, NS @ 100 as pt received contrast media     PPX: SCDs and Lovenox for DVT prevention     The patient was discussed with Dr. 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     Patient was in rehabilitation for stroke deficits including right hemiparesis, right ataxia, dysarthria. 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 Patient noted to have waxing and waning mental status at rehab facility through his stay there but this morning he was noted to have decline in functioning so decision was made to obtain STAT MRI which showed a new tiny area of acute or early subacute stroke in right tectal-superior cerebellar peduncle infarc          Review of Systems     All other systems reviewed and are negative.    Constitutional: negative   Eyes: negative   Ears, nose, mouth, throat, and face: negative   Respiratory: negative   Cardiovascular: negative   Gastrointestinal: negative   Genitourinary: negative   Integument/breast: negative   Hematologic/lymphatic: negative   Musculoskeletal: negative   Neurological: negative   Behavioral/Psych: negative   Endocrine: negative   Allergic/Immunologic: negative    Stroke Activation Summary     Patient Arrival: 62  ASRT Arrival: 1901  Location of Response : ED Rm 32   Page Received: 1856    Clinical Presentation: Decreased LOC, Right sided weakness, Dysarthria    Signs & Symptoms: Last Known Well  Last Known Well - Date: 03/09/19  Last Known Well - Time: 0900     CT/CTP/CTA:        BP: 148/87 (08/26 2000)  Temp: 36.6 ???C (97.8 ???F) (08/26 0941)  Pulse: 75 (08/26 2000)  Respirations: 19 PER MINUTE (08/26 2000)  SpO2: 97 % (08/26 2000)  SpO2 Pulse: 75 (08/26 2000)  Height: 160 cm (63) (08/26 1749)    NIHSS Completed at: 720 PM       NIH Stroke Scale Item  Scoring Definition  Score    1a. LOC  0=alert and responsive   1=arousable to minor stimulation   2=arousable only to painful stimulation   3=reflex responses or unrousable  1 1b. LOC questions-as patient???s age and month. Must be exact.  0=both correct   1=one correct (or dysarthria, intubated, foreign language)   2=neither correct  2    1c. Commands-open/close eyes, grip and release non-paretic hand (other 1 step commands or mimic OK)  0=both correct (ok if impaired by weakness)   1=one correct   2=neither correct  0    2. Best Gaze-horizontal EOM by voluntary or Leslie Dales???s  0=normal   1=partial gaze palsy (abnormal gaze in one or both eyes)   2=forced eye deviation or total paresis which cannot be overcome by Leslie Dales???s  0    3. Visual Field-use visual threat  if necessary. If monocular, score field of good eye  0=no visual loss   1=partial hemianopia, quadrantanopia, extinction   2=complete hemianopia   3=bilateral hemianopia or blindness  0    4. Facial Palsy-if stuporous, check symmetry of grimace to pain  0=normal   1=minor paralysis, flat NLF, asymm smile   2=partial paralysis (lower face=UMN)   3=complete paralysis (upper and lower face)  1    5. Motor Arm-arms outstretched 90 deg (sitting) or 45 deg (supine) for 10 seconds. Encourage best effort.  0=no drift x 10 seconds   1=drift but doesn???t hit bed   2=some antigravity effort, but can???t sustain   3=no antigravity effort, but even minimal mvt counts   4=no movement at all   X=unable to assess due to amputation, fusion, etc  L/R   1/1    6. Motor Leg-raise leg to 30 degrees supine x 5 seconds  0=no drift x 5 seconds   1=drift but doesn???t hit bed   2=some antigravity effort, but can???t sustain   3=no antigravity effort, but even minimal mvt counts   4=no movement at all   X=unable to assess due to amputation, fusion, etc  L/R   1/1    7. Limb Ataxia-check finger-nose- finger; heel-shin; and score only if out of proportion to paralysis  0=no ataxia (or aphasic, hemiplegic)   1=ataxia in upper or lower extremity   2=ataxia in upper AND lower extremity   X=unable to assess due to amputation, fusion, etc  0 8. Sensory-use safety pin. Check grimace or withdrawal if stuporous. Score only stroke- related losses  0=normal   1=mild-mod unilateral loss but patient aware of touch 9or aphasic, confused)   2=total loss, pt unaware of touch. Coma, bilateral loss  0   9. Best Language-describe cookie jar picture, name objects, read sentences. May use repeating, writing, stereognosis  0=normal   1=mild-mod aphasia (diff but partly comprehensible)   2=severe aphasia (almost no info exchanged)   3=mute, global aphasia, coma. No 1 step commands  1   10. Dysarthria-read list of words  0=normal   1=mild-mod; slurred but intelligible   2=severe; unintelligible or mute  1   11. Extinction/Neglect- simultaneously touch patient on both hands, show fingers in both visual fields, ask about deficit, left hand  0=normal, none detected. (visual loss alone)   1=neglects or extinguishes to double simult stimulation in any modality   2=profound neglect in more than one modality  0    Score    10       Was IV tPA given? No    The patient was not a tPA candidate due to delayed presentation     Advanced imaging was interpreted at   The patient was not a thrombectomy candidate due to MRI finding does not fit with LVO     Dysphagia screen:  Did Patient Pass The Swallow Screen Part I?: No  If No Name of Physician Notified:                 Performed by nursing staff and passed or Failed screen by nursing staff and awaiting ST evaluation     Cardiac rhythm on presentation: NSR         Health History     Medical History:   Diagnosis Date   ??? DM (diabetes mellitus) (HCC)    ??? Hypertension    ??? TIA (transient ischemic attack)    ??? Tremors of nervous system    ???  Weight loss      Surgical History:   Procedure Laterality Date   ??? HX BACK SURGERY      C6-7 ACDF (cervical)     Family History   Family history unknown: Yes     Social History     Socioeconomic History   ??? Marital status: Married     Spouse name: [name]   ??? Number of children: 3 ??? Years of education: Not on file   ??? Highest education level: Not on file   Occupational History   ??? Not on file   Tobacco Use   ??? Smoking status: Never Smoker   ??? Smokeless tobacco: Former Neurosurgeon     Types: Chew   Substance and Sexual Activity   ??? Alcohol use: Yes     Comment: occasional   ??? Drug use: Never   ??? Sexual activity: Not on file   Other Topics Concern   ??? Not on file   Social History Narrative    - Anterior cervical discectomy and fusion in 1997    - Used to work building metal tanks prior to retiring        Medications:      PRN Medications:         Physical Exam     HEENT: normocephalic, eyes open with no discharge, nares patent, oropharynx is clear with no lesions, palate intact  CV: regular rate, distal pulses palpable  Chest: normal configuration, equal chest rise bilaterally  Ab: soft, non-tender, no masses, no organomegaly  Skin: no rashes or lesions    Extended Neuro Exam:    Mental status: alert, oriented to person only   Speech:    Normal Abnormal   Fluency x    Comprehension x    Articulation x    Repetition x    Naming x        Cranial Nerves:    Normal Abnormal   II x    III, IV, VI x    V x    VII                         x                             VIII  Mild left facial droop   IX, X x    XI x    XII x        Muscle/motor:   Tone: nml  Bulk: nml  Fasciculations: none  Pronator drift: none     NF NE SA EF EE WE WF FF FE FA TA HF HA HE KF KE DF PF   R   5 5 5       4  4 4 4 5 5    L   5 5 5       4  4 4 4 5 5        Sensation:    Normal RUE LUE RLE LLE   Light Touch x       Pin Prick x       Temperature        Vibration        Proprioception            Coordination:    Normal Abnormal Right Abnormal Left   Finger to Nose x     Rapid alternating  Heel to Shin x     Finger tap      Foot tap        Gait and Sation:  Deferred          Lab/Radiology/Other Diagnostic Tests:  24-hour labs:    Results for orders placed or performed during the hospital encounter of 03/09/19 (from the past 24 hour(s))   COMPREHENSIVE METABOLIC PANEL    Collection Time: 03/09/19  6:02 PM   Result Value Ref Range    Sodium 137 137 - 147 MMOL/L    Potassium 3.6 3.5 - 5.1 MMOL/L    Chloride 102 98 - 110 MMOL/L    Glucose 158 (H) 70 - 100 MG/DL    Blood Urea Nitrogen 12 7 - 25 MG/DL    Creatinine 9.81 0.4 - 1.24 MG/DL    Calcium 8.4 (L) 8.5 - 10.6 MG/DL    Total Protein 6.0 6.0 - 8.0 G/DL    Total Bilirubin 1.5 (H) 0.3 - 1.2 MG/DL    Albumin 2.5 (L) 3.5 - 5.0 G/DL    Alk Phosphatase 191 (H) 25 - 110 U/L    AST (SGOT) 28 7 - 40 U/L    CO2 30 21 - 30 MMOL/L    ALT (SGPT) 23 7 - 56 U/L    Anion Gap 5 3 - 12    eGFR Non African American >60 >60 mL/min    eGFR African American >60 >60 mL/min   CBC AND DIFF    Collection Time: 03/09/19  6:02 PM   Result Value Ref Range    White Blood Cells 6.9 4.5 - 11.0 K/UL    RBC 4.11 (L) 4.4 - 5.5 M/UL    Hemoglobin 12.4 (L) 13.5 - 16.5 GM/DL    Hematocrit 47.8 (L) 40 - 50 %    MCV 88.9 80 - 100 FL    MCH 30.1 26 - 34 PG    MCHC 33.9 32.0 - 36.0 G/DL    RDW 29.5 11 - 15 %    Platelet Count 149 (L) 150 - 400 K/UL    MPV 7.2 7 - 11 FL    Neutrophils 50 41 - 77 %    Lymphocytes 35 24 - 44 %    Monocytes 9 4 - 12 %    Eosinophils 5 0 - 5 %    Basophils 1 0 - 2 %    Absolute Neutrophil Count 3.40 1.8 - 7.0 K/UL    Absolute Lymph Count 2.43 1.0 - 4.8 K/UL    Absolute Monocyte Count 0.63 0 - 0.80 K/UL    Absolute Eosinophil Count 0.35 0 - 0.45 K/UL    Absolute Basophil Count 0.06 0 - 0.20 K/UL    MDW (Monocyte Distribution Width) 20.6 <20.7   PROTIME INR (PT)    Collection Time: 03/09/19  6:02 PM   Result Value Ref Range    INR 1.2 0.8 - 1.2   PTT (APTT)    Collection Time: 03/09/19  6:02 PM   Result Value Ref Range    APTT 33.4 24.0 - 36.5 SEC   CREATINE KINASE-CPK    Collection Time: 03/09/19  6:02 PM   Result Value Ref Range    Creatine Kinase 115 35 - 232 U/L   MAGNESIUM    Collection Time: 03/09/19  6:02 PM   Result Value Ref Range    Magnesium 1.9 1.6 - 2.6 mg/dL POC TROPONIN    Collection Time: 03/09/19  7:03 PM  Result Value Ref Range    Troponin-I-POC 0.00 0.00 - 0.05 NG/ML   POC GLUCOSE    Collection Time: 03/09/19  7:05 PM   Result Value Ref Range    Glucose, POC 154 (H) 70 - 100 MG/DL   POC CREATININE, RAD    Collection Time: 03/09/19  7:05 PM   Result Value Ref Range    Creatinine, POC 0.7 0.4 - 1.24 MG/DL   POC PT/INR    Collection Time: 03/09/19  7:05 PM   Result Value Ref Range    INR POC 1.1 0.8 - 1.2   AMPHETAMINES-URINE RANDOM    Collection Time: 03/09/19  8:30 PM   Result Value Ref Range    Amphetamines NEG NEG-NEG   BARBITURATES-URINE RANDOM    Collection Time: 03/09/19  8:30 PM   Result Value Ref Range    Barbiturates,Urine NEG NEG-NEG   BENZODIAZEPINES-URINE RANDOM    Collection Time: 03/09/19  8:30 PM   Result Value Ref Range    Benzodiazepines NEG NEG-NEG   CANNABINOIDS-URINE RANDOM    Collection Time: 03/09/19  8:30 PM   Result Value Ref Range    THC NEG NEG-NEG   COCAINE-URINE RANDOM    Collection Time: 03/09/19  8:30 PM   Result Value Ref Range    Cocaine-Urine NEG NEG-NEG   OPIATES-URINE RANDOM    Collection Time: 03/09/19  8:30 PM   Result Value Ref Range    Opiates-Urine NEG NEG-NEG   PHENCYCLIDINES-URINE RANDOM    Collection Time: 03/09/19  8:30 PM   Result Value Ref Range    Phencyclidine (PCP) NEG NEG-NEG   URINALYSIS DIPSTICK    Collection Time: 03/09/19  8:30 PM   Result Value Ref Range    Color,UA AMBER     Turbidity,UA CLEAR CLEAR-CLEAR    Specific Gravity-Urine 1.014 1.003 - 1.035    pH,UA 6.0 5.0 - 8.0    Protein,UA 1+ (A) NEG-NEG    Glucose,UA NEG NEG-NEG    Ketones,UA NEG NEG-NEG    Bilirubin,UA NEG NEG-NEG    Blood,UA 3+ (A) NEG-NEG    Urobilinogen,UA INCREASED (A) NORM-NORMAL    Nitrite,UA NEG NEG-NEG    Leukocytes,UA NEG NEG-NEG    Urine Ascorbic Acid, UA NEG NEG-NEG   URINALYSIS, MICROSCOPIC    Collection Time: 03/09/19  8:30 PM   Result Value Ref Range    WBCs,UA 2-10 0 - 2 /HPF    RBCs,UA PACKED 0 - 3 /HPF   AMMONIA Collection Time: 03/09/19  8:47 PM   Result Value Ref Range    Ammonia 131 (H) 9 - 35 MCMOL/L   POC LACTATE    Collection Time: 03/09/19  8:54 PM   Result Value Ref Range    LACTIC ACID POC 1.1 0.5 - 2.0 MMOL/L     Glucose: (!) 158 (03/09/19 1802)  POC Glucose (Download): (!) 154 (03/09/19 1905)  Pertinent radiology reviewed.

## 2019-03-11 ENCOUNTER — Encounter: Admit: 2019-03-11 | Discharge: 2019-03-11

## 2019-03-11 DIAGNOSIS — I10 Essential (primary) hypertension: Secondary | ICD-10-CM

## 2019-03-11 DIAGNOSIS — R251 Tremor, unspecified: Secondary | ICD-10-CM

## 2019-03-11 DIAGNOSIS — I2510 Atherosclerotic heart disease of native coronary artery without angina pectoris: Secondary | ICD-10-CM

## 2019-03-11 DIAGNOSIS — E119 Type 2 diabetes mellitus without complications: Secondary | ICD-10-CM

## 2019-03-11 DIAGNOSIS — K7460 Unspecified cirrhosis of liver: Secondary | ICD-10-CM

## 2019-03-11 DIAGNOSIS — I639 Cerebral infarction, unspecified: Secondary | ICD-10-CM

## 2019-03-11 LAB — CULTURE-URINE W/SENSITIVITY
Lab: >10
Lab: >10
Lab: >10 — AB

## 2019-03-11 LAB — COMPREHENSIVE METABOLIC PANEL: Lab: 139 MMOL/L — ABNORMAL LOW (ref 137–147)

## 2019-03-11 LAB — POC GLUCOSE
Lab: 116 mg/dL — ABNORMAL HIGH (ref 70–100)
Lab: 100 mg/dL (ref 70–100)
Lab: 190 mg/dL — ABNORMAL HIGH (ref 70–100)
Lab: 158 mg/dL — ABNORMAL HIGH (ref 70–100)

## 2019-03-11 LAB — PROTIME INR (PT): Lab: 1.3 MMOL/L — ABNORMAL HIGH (ref 0.8–1.2)

## 2019-03-11 LAB — PLATELET P2Y12 RESPONSE: Lab: 163 [PRU] — ABNORMAL LOW (ref 180–335)

## 2019-03-11 LAB — CBC: Lab: 7.9 K/UL — ABNORMAL LOW (ref >60)

## 2019-03-11 LAB — CELL COUNT W/DIFF-FLUIDS
Lab: 120 /uL
Lab: 240 /uL
Lab: 5 %

## 2019-03-11 LAB — MAGNESIUM: Lab: 1.5 mg/dL — ABNORMAL LOW (ref 1.6–2.6)

## 2019-03-11 LAB — PHOSPHORUS: Lab: 3.2 mg/dL — ABNORMAL LOW (ref >60)

## 2019-03-11 MED ORDER — MAGNESIUM SULFATE IN D5W 1 GRAM/100 ML IV PGBK
1 g | Freq: Once | INTRAVENOUS | 0 refills | Status: CP
Start: 2019-03-11 — End: ?
  Administered 2019-03-11: 16:00:00 1 g via INTRAVENOUS

## 2019-03-11 NOTE — Consults
CLINICAL NUTRITION                                                        Clinical Nutrition Initial Assessment    Name: Mitchell Lucero        MRN: [id number]          DOB: 10-Jun-1951          Age: 68 y.o.  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 Patient admitted to medicine for hepatic encephalopathy, progression/acute stroke. Consult for nutritional assessment. Patient was being seen by dietitian in rehab and patient was not eating well. He prefers salty foods vs sweet protein supplements. He had bran flakes and milk for breakfast. He was okay with eating hard boiled eggs, cottage cheese once a day and work on a goal of eating 2-3 meals a day. 24hr poc 91-116mg /dL. Will clear patient to eat 1 cottage cheese a day. Meds/labs reviewed. Stage 1 pressure injury on coccyx. Patient meets criteria for malnutrition d/t his weight loss, poor po intake and muscle wasting.     Nutrition Assessment of Patient:   ;  ; Desired Weight: 63.7 kg  BMI (Calculated): 27.46; BMI Categories Adult: Over Weight: 25-29.9; Appearance: Appropriate    Pertinent Allergies/Intolerances: n/a  Pertinent Labs: poc 91-116mg /dL; Pertinent Meds: insulin; Unintentional Weight Loss: > 20% in 1 year (severe)  Oral Diet Order: Low sodium;       Current Oral Intake: Inadequate  Estimated Calorie Needs: 7564-3329(51-88CZYS/AY desired wt)  Estimated Protein Needs: 83(1.3g/kg/desired wt)    Malnutrition Assessment: Malnutrition present; ICD-10 code E44: Chronic illness/Moderate non-severe malnutrition;  ;  ; Energy intake: < 75% of estimated energy requirement for 1 month or more, Weight loss: 20% x 1 year, Mild loss of muscle mass;    Malnutrition Interventions: Allow liberalized diet, encouraged good PO efforts     Nutrition Focused Physical Assessment:   Loss of Subcutaneous Fat: No;  ;    Muscle Wasting: Yes; Severity: Moderate; Location: Clavicle, Interosseous, Temple  Edema: Yes; Severity: Moderate; Location: (dendent )     Pressure Injury: coccyx stg 1          Nutrition Diagnosis:  Inadequate protein-energy intake  Etiology: poor appetite, recent CVA  Signs & Symptoms: pt and wife report       Intervention / Plan:  monitor wt, lab trends and GI symptoms           Goals:  Patient to consume >75% of meals/supplements  Time Frame: Within 72 hours           Lajuana Carry, MS, RD, CSR, LD    Office: [phone]   [place]: [phone]

## 2019-03-11 NOTE — Progress Notes
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 Patient admitted to medicine for hepatic encephalopathy, progression/acute stroke.   ???  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 LIRADS 5. Cirrhosis with portal hypertension, including mild splenomegaly,  portosystemic varices, and small volume ascites. Mild peritoneal thickening and enhancement, which may reflect  infectious peritonitis. Metastatic disease could appear similar. Prominent periportal, portacaval, and epiphrenic lymph nodes, which are indeterminate and may be reactive or metastatic. Cholelithiasis.   - Tbili 1.5, albumin 2.5, Alk phos 200, AST/ALT WNL, INR 1.2         - Afebrile, no leukocytosis  - CXR showed improvement in patchy bibasilar opacities, now minimal, likely atelectasis. No new acute cardiopulmonary findings.    - UDS negative.  - UA w/o evidence of infection but with hematuria  - Paracentesis with no evidence of SBP  - Abd U/S w/ dopplers showed patent hepatic vessels with normal direction of blood flow. 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 There are 2 smaller observations elsewhere in the liver which are subthreshold for imaging characterization, possibly small regenerative nodules. These can be reevaluated on surveillance CT/MR studies for known hepatoma. Portal hypertension, with mild splenomegaly and small volume ascites.???Cholelithiasis and gallbladder sludge. Circumferential bladder wall thickening, which is nonspecific though often related to chronic bladder outlet obstruction or cystitis.   - PVR bladder scan  MELD-Na score: 12 at 03/11/2019  3:40 AM  MELD score: 12 at 03/11/2019  3:40 AM  Calculated from:  Serum Creatinine: 0.68 MG/DL (Rounded to 1 MG/DL) at 4/54/0981  1:91 AM  Serum Sodium: 139 MMOL/L (Rounded to 137 MMOL/L) at 03/11/2019  3:40 AM  Total Bilirubin: 2.0 MG/DL at 4/78/2956  2:13 AM  INR(ratio): 1.3 at 03/11/2019  3:40 AM  Age: 23 years 3 months  - At this point of time suspected patient's mental status changes/lethargy is related to stroke + hepatic encephalopathy.  PLAN:   - Discussed GOC with wife and patient's 3 kids via phone. Interested in Palliative care and possible hospice. Palliative care consulted.  - Hepatology following - do not think AMS related to PSE but likely new stroke. Note treating his hepatocellular carcinoma would not affect his overall short-term prognosis given his recurrent strokes  - Hepatology recommends Lasix and Aldactone initiation but will hold for now pending GOC and very poor PO intake  - Follow up blood cultures - neg to date  - Continue Lactulose PO and enema q4h prn, titrate 3-4 bowel movements per day  - Hold Baclofen    Recurrent strokes w/ progression of now moderate acute-early subacute right superior cerebellar-brachium  pontis infarcts (right SCA territory) on 03/09/2019 MRI  Dysarthria, gait imbalance, weakness  - MRI Head on 8/26 showed progression of now moderate acute-early subacute right superior cerebellar-brachium pontis infarcts (right SCA territory). New tiny acute or early subacute right tectal-superior cerebellar peduncle infarct. No hemorrhagic conversion or significant associated mass effect. Expected evolution of late subacute-chronic left lateral pontine infarct with wallerian degeneration in the left brachium pontis. Persistent mild generalized cerebral volume loss and marked nonspecific supratentorial white matter disease, likely due to chronic small vessel ischemic changes.   - CT Head 8/26 showed redemonstration of recent right superior cerebellar artery territory infarct. The new small area of infarct (on same-day MRI) in the right superior cerebellar peduncle and tectum is of similar density to the adjacent SCA territory infarct and is also likely subacute. Redemonstration of subacute to chronic left pontine infarct with evolving wallerian degeneration of the left brachium pontis. Moderate to marked nonspecific white matter disease, likely due to chronic microvascular ischemia. No acute intracranial hemorrhage.   - Patient sent to ED from [facility] after findings of new stroke on imaging  - Stroke activated in the ED but not tPA candidate  PLAN:  - Neurology following  - Allow for permissive hypertension in first 48-72 hours, SBP goal 160 - 220 mmHg and DBP 70-110.  - Telemetry to monitor for arrhythmia   - Continue DAPT with ASA and Plavix.  - PT/OT/SLP consulted  ???  IDDM  - Continue PTA Lantus and continue LCDF    History of orthostatic hypotension  - Continue PTA Midodrine  ???  CAD s/p PCI in 1997  HLD  - Denies chest pain, shortness of breath. Denies stress test since 1997.  PLAN:  - Continue DAPT for CVA as noted above  - Continue statin  ???  Depression  - Continue PTA Celexa    Inadequate oral intake  - Dietitian consulted    Bicytopenia (anemia and thrombocytopenia)  - Stable, continue to monitor    FEN: Regular diet  Ppx: Lovenox  FULL CODE  Dispo: Continue inpatient admission    Today's visit was prolonged due to face-to-face visit with patient and wife and telephone conference with his children about his overall medical conditions, treatment options and GOC.    Visit Start Time 3:30pm Visit End Time 4:35pm.    ________________________________________________________________________    Subjective  No events reported overnight. The patient is oriented to person and knew he was at [facility]. He remains lethargic. PT noted he did well and recommended rehab consult. After further discussion with patient's wife and children about his overall medical conditions and prognosis, they would like to meet with palliative care and may be interested in home hospice.    Medications  Scheduled Meds:aspirin EC tablet 81 mg, 81 mg, Oral, QAM8  atorvastatin (LIPITOR) tablet 80 mg, 80 mg, Oral, QHS  citalopram (CeleXA) tablet 20 mg, 20 mg, Oral, QAM8  clopiDOGrel (PLAVIX) tablet 75 mg, 75 mg, Oral, QDAY  dextran 70/hypromellose (GENTEAL TEARS) ophthalmic solution 1 drop, 1 drop, Both Eyes, TID  enoxaparin (LOVENOX) syringe 40 mg, 40 mg, Subcutaneous, QDAY(21)  insulin aspart U-100 (NOVOLOG FLEXPEN) injection PEN 0-6 Units, 0-6 Units, Subcutaneous, ACHS (22)  insulin glargine (LANTUS SOLOSTAR) injection PEN 5 Units, 5 Units, Subcutaneous, QHS  lactulose oral solution 20 g, 30 mL, Oral, Q4H    Or  lactulose(#) 200 g/300 mL rectal enema 200 g, 200 g, Rectal, Q4H  latanoprost (XALATAN) 0.005 % ophthalmic solution 1 drop, 1 drop,  Both Eyes, QHS  melatonin tablet 3 mg, 3 mg, Oral, QHS  midodrine (PROAMATINE) tablet 5 mg, 5 mg, Oral, TID  pantoprazole DR (PROTONIX) tablet 20 mg, 20 mg, Oral, QDAY    Continuous Infusions:  PRN and Respiratory Meds:acetaminophen Q6H PRN, bisacodyL BID PRN, prochlorperazine Q6H PRN **OR** prochlorperazine Q6H PRN      Review of Systems:  A 14 point review of systems was negative except as documented in the above subjective section of note.       Objective:                          Vital Signs: Last Filed                 Vital Signs: 24 Hour Range BP: 124/66 (08/28 1400)  Temp: 36.8 ???C (98.3 ???F) (08/28 1400)  Pulse: 88 (08/28 0926)  Respirations: 14 PER MINUTE (08/28 1400)  SpO2: 95 % (08/28 1400) BP: (119-139)/(60-80)   Temp:  [36.8 ???C (98.3 ???F)-37.8 ???C (100 ???F)]   Pulse:  [86-100]   Respirations:  [14 PER MINUTE-17 PER MINUTE]   SpO2:  [92 %-98 %]      Vitals:    03/09/19 1749   Weight: 70.3 kg (155 lb)       Intake/Output Summary:  (Last 24 hours)    Intake/Output Summary (Last 24 hours) at 03/11/2019 1741  Last data filed at 03/11/2019 1600  Gross per 24 hour   Intake 640 ml   Output 0 ml   Net 640 ml      Stool Occurrence: 1    Physical Exam  General:  Lethargic, cooperative, no distress, appears stated age  Head:  Normocephalic, without obvious abnormality, atraumatic  Eyes:  Conjunctivae/corneas clear. PERRL, EOMs intact.   Neck:    Supple, symmetrical, trachea midline  Lungs:  Clear to auscultation bilaterally  Heart:   Regular rate and rhythm  Abdomen:  Soft, non-tender. Bowel sounds normal.   Extremities: Extremities normal, atraumatic, no cyanosis or edema  Skin: Skin color, texture, turgor normal. No rashes or lesions  Neurologic:  Lethargic, oriented to person and place. 1/5 strength b/l LEs, 3/5 strength b/l UEs, mild asterixis, slurred speech, R facial droop      Lab Review  24-hour labs:    Results for orders placed or performed during the hospital encounter of 03/09/19 (from the past 24 hour(s))   POC GLUCOSE    Collection Time: 03/10/19  9:11 PM   Result Value Ref Range    Glucose, POC 116 (H) 70 - 100 MG/DL   CBC    Collection Time: 03/11/19  3:40 AM   Result Value Ref Range    White Blood Cells 7.9 4.5 - 11.0 K/UL    RBC 3.97 (L) 4.4 - 5.5 M/UL    Hemoglobin 11.9 (L) 13.5 - 16.5 GM/DL    Hematocrit 41.3 (L) 40 - 50 %    MCV 88.3 80 - 100 FL    MCH 30.1 26 - 34 PG    MCHC 34.0 32.0 - 36.0 G/DL    RDW 24.4 11 - 15 %    Platelet Count 158 150 - 400 K/UL    MPV 7.6 7 - 11 FL   COMPREHENSIVE METABOLIC PANEL    Collection Time: 03/11/19  3:40 AM Result Value Ref Range    Sodium 139 137 - 147 MMOL/L    Potassium 3.9 3.5 - 5.1 MMOL/L  Chloride 104 98 - 110 MMOL/L    Glucose 90 70 - 100 MG/DL    Blood Urea Nitrogen 17 7 - 25 MG/DL    Creatinine 1.61 0.4 - 1.24 MG/DL    Calcium 8.5 8.5 - 09.6 MG/DL    Total Protein 5.7 (L) 6.0 - 8.0 G/DL    Total Bilirubin 2.0 (H) 0.3 - 1.2 MG/DL    Albumin 2.6 (L) 3.5 - 5.0 G/DL    Alk Phosphatase 045 (H) 25 - 110 U/L    AST (SGOT) 28 7 - 40 U/L    CO2 28 21 - 30 MMOL/L    ALT (SGPT) 19 7 - 56 U/L    Anion Gap 7 3 - 12    eGFR Non African American >60 >60 mL/min    eGFR African American >60 >60 mL/min   MAGNESIUM    Collection Time: 03/11/19  3:40 AM   Result Value Ref Range    Magnesium 1.5 (L) 1.6 - 2.6 mg/dL   PHOSPHORUS    Collection Time: 03/11/19  3:40 AM   Result Value Ref Range    Phosphorus 3.2 2.0 - 4.5 MG/DL   PROTIME INR (PT)    Collection Time: 03/11/19  3:40 AM   Result Value Ref Range    INR 1.3 (H) 0.8 - 1.2   POC GLUCOSE    Collection Time: 03/11/19  8:47 AM   Result Value Ref Range    Glucose, POC 100 70 - 100 MG/DL   POC GLUCOSE    Collection Time: 03/11/19 12:13 PM   Result Value Ref Range    Glucose, POC 190 (H) 70 - 100 MG/DL       Point of Care Testing  (Last 24 hours)  Glucose: 90 (03/11/19 0340)  POC Glucose (Download): (!) 190 (03/11/19 1213)    Radiology and other Diagnostics Review:    Pertinent radiology reviewed.    Steffanie Dunn, DO   Pager [phone]

## 2019-03-11 NOTE — Progress Notes
Brief Hepatology Note  Patient Name:Mitchell Lucero         PIR:[id number]  Admission Date: 03/09/2019  5:45 PM  LOS: LOS: 2 days        No acute events overnight. Patient's MS appears improved, he is awake, but slow to respond. Able to follow simple commands. He was able to tolerate lactulose and had 4 BMs. 3L LVP w/o SBP.    NASH cirrhosis, HCC - now decompensated w/ MELD 12  Transplant candidacy  Despite HCC being within [place] criteria, patient is not a candidate due to his comorbidities  >> Now with new infarcts and recurrent readmissions  >> recommend goals of care discussions     AMS  Hx of recent infarcts and new infarcted areas on MRI 8/26, on DAPT  Concern for hepatic encephalopathy  Started on lactulose at rehab facility, but patient unable to tolerate due to his gag reflex.  Ultrasound liver with Doppler shows patent vessels, now w/ mild ascites  UDS is negative, recent UTI 8/17, pending urine culture, chest x-ray negative, elevated Ammonia 131  CT head is negative, MRI of the head shows progression of now moderate acute???early subacute right superior cerebellar/brachium pontis infarcts and new tiny acute or early subacute right tectal superior cerebellar peduncle infarct without hemorrhagic conversion.  Expected evolution of the lateral pontine infarct.  Dx and therapeutic paracentesis performed, 3L removed, no SBP  >> In view of the MRI findings, do not suspect hepatic encephalopathy is underlying etiology of his altered mental status  >> f/u completion of infectious w/u , BCx neg 1xd, UCx pending  >> monitor and replete electrolytes    Fluid management  + ascites on imaging and exam, no LEE  LVP 3 L 8/27, no SBP  >> 2g low-sodium diet  >> Strict I&Os  >> start diuretics at 40 mg Lasix daily and 100 mg aldactone daily     Esophageal varices  No prior endoscopies. Plan as outpatient.    We will sign off, please page [place] with any questions or concerns. Patient seen and discussed with attending on service, Dr. [place].    Renea Ee, MD  Gastroenterology & Hepatology Fellow  Pager [phone]

## 2019-03-11 NOTE — Progress Notes
ORTHOTICS/PROSTHETICS  Consult Note:      NAME: Mitchell Lucero  ADMISSION DATE: admitted to hospital  DOB: 07-Feb-1951  AGE: 68 y.o.  ROOM: [id number]  DOCTOR:     Date of Order: 03/11/19  Date of Service: 03/11/19    Services referred for: Orthotic Eval and Treat: PRAFO-Anatomical concepts    Description of condition/injury, including services:Rt foot drop    Size: Reg Anatomical Concepts PRAFO     Side Alternating      Measurements: Area/circumference/Diameter/Length None    Functional Goals discussed for device use:  Ankle positioning to prevent plantar flexion contracture and unload calcaneus    Device is to be ordered No    Estimated date of delivery Today    Functional goals met: PRAFO delivered to pt's room for RN to fit.     The patient states satisfaction with the fit/function of device: n/a pt sedated    Additional supplies provided to the patient: None    Patient Education:  Written instruction/information provided to Patient  Information provided: device function, donning/doffing of device and care and cleaning      Patient did tolerate the procedure without incident/problem.    Follow-up scheduled: PRN    PLAN:  Order completed    Dahlia Client  03/11/2019

## 2019-03-11 NOTE — Consults
Physical Medicine & Rehabilitation Consult Service    Name: Mitchell Lucero        MRN: [id number]          DOB: 04/05/1951        Age: 68 y.o.   Admission Date: 03/09/2019             LOS: 2     Date of Service: 03/11/19   Financial Class: Payor: Payor: [employer] MEDICARE / Plan: [employer]ia [employer] MEDICARE PPO / Product Type: Medicare /   Referring Physician: Steffanie Dunn, DO  Reason for Consult: evaluate for Post-Acute Rehab/Placement  Precautions: Fall  Weight Bearing Precautions:  WBAT     Active Problems  Patient Active Problem List    Diagnosis Date Noted   ??? Acute encephalopathy 03/09/2019   ??? 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 Cerebellar stroke, acute (HCC) 02/23/2019   ??? Impaired gait 02/23/2019   ??? Postural instability of trunk 02/23/2019   ??? Ataxia 02/23/2019   ??? Impaired mobility and ADLs 02/23/2019   ??? Acute ischemic stroke (HCC) 02/23/2019   ??? Thrombocytopenia (HCC) 02/22/2019   ??? 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 Dysarthria 02/21/2019   ??? Right hemiparesis (HCC) 02/10/2019   ??? Cirrhosis (HCC) 02/10/2019   ??? HTN (hypertension) 02/04/2019   ??? Tremors of nervous system 02/04/2019   ??? T2DM (type 2 diabetes mellitus) (HCC) 02/04/2019   ??? History of neck surgery 02/04/2019   ??? 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 TIA (transient ischemic attack) 02/04/2019   ??? Gait abnormality 02/04/2019   ??? Orthostatic hypotension 01/31/2019     ??? Gait abnormality 03/11/19    ??? Impaired mobility/ADLs 03/11/19    ??? Impaired transfers 03/11/19    ??? Cognitive deficits 03/11/19       Assessment & Plan       Mitchell Lucero is a 68 y.o. male admitted to [facility] on 03/09/2019 with the following issues:    Acute ischemic stroke    Recommendations:  Post-acute care rehabilitation needs: acute inpatient rehabilitation  Patient???s medical complexity warrants daily physician oversight and functional goals consistent with intensive rehabilitation in acute inpatient rehabilitation. No obvious barriers to admission at this time.     Impairments: cognitive impairments, pain, poor activity tolerance and weakness  Activity Limitations:  bathing, dressing - upper, dressing - lower, toileting, transfers, ambulation and stairs  Participation Restrictions: unable to return home safely  Family / Patient Dispositional Goals: return home with family assistance    Overall Functional Goals  Gait and mobility Min A   Transfers Min A   ADLs Min A   Cognition / Communication Speech therapy will evaluate and treat cognition and communication deficits and assess for safe swallow        Barriers/Facilitators:  Barriers: High burden of care, Medical complexity, Poor strength/endurance and Uncontrolled pain  Facilitators: improving strength / endurance and improving medical condition  Rehabilitation Prognosis: Fair    Tolerance for three hours of therapy a day: Fair       Thank you for this consultation.  Please call our consult pager with questions or concerns.     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  This showed progression now moderate right superior cerebellar and pontine infarcts.  These findings were discussed with neurology who favored admitting back to acute care for further work-up.  No TPA due to delayed presentation and no EVT due to no LVO.  Checking platelet inhibition test to make sure Plavix is therapeutic.  Hospital course also complicated by acute encephalopathy for which hematology was consulted he did have diagnostic and therapeutic paracentesis on 8/27. He is currently requiring moderate assist x2 for bed mobility and transfers.    Pt is working with PT and OT to address functional and mobility deficits, and rehab medicine is now consulted for post-acute rehab/placement recommendations.    Patient denies F/C/N/V/D/SOB/CP.     Medical History:   Diagnosis Date   ??? CAD (coronary artery disease) 1997    S/P pci   ??? 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 DM (diabetes mellitus) (HCC)    ??? Hypertension    ??? Stroke (HCC)     12/2018 (first one), 01/2019 , 02/2019   ??? Tremors of nervous system         Surgical History:   Procedure Laterality Date   ??? PERCUTANEOUS CORONARY INTERVENTION  1997   ??? HX BACK SURGERY      C6-7 ACDF (cervical)        Social History     Socioeconomic History   ??? Marital status: Married     Spouse name: [name]   ??? Number of children: 3   ??? Years of education: Not on file   ??? Highest education level: Not on file   Occupational History   ??? Not on file   Tobacco Use   ??? Smoking status: Never Smoker   ??? Smokeless tobacco: Former Neurosurgeon     Types: Chew   Substance and Sexual Activity   ??? Alcohol use: Yes     Comment: occasional   ??? Drug use: Never   ??? Sexual activity: Not on file   Other Topics Concern   ??? Not on file   Social History Narrative    - Anterior cervical discectomy and fusion in 1997    - Used to work building metal tanks prior to retiring     Family History   Problem Relation Age of Onset   ??? Coronary Artery Disease Other         biological father's side   ??? Heart Disease Other    ??? Stroke Other      Family history: noncontributory     Scheduled Meds:aspirin EC tablet 81 mg, 81 mg, Oral, QAM8  atorvastatin (LIPITOR) tablet 80 mg, 80 mg, Oral, QHS  citalopram (CeleXA) tablet 20 mg, 20 mg, Oral, QAM8  clopiDOGrel (PLAVIX) tablet 75 mg, 75 mg, Oral, QDAY  dextran 70/hypromellose (GENTEAL TEARS) ophthalmic solution 1 drop, 1 drop, Both Eyes, TID  enoxaparin (LOVENOX) syringe 40 mg, 40 mg, Subcutaneous, QDAY(21) insulin aspart U-100 (NOVOLOG FLEXPEN) injection PEN 0-6 Units, 0-6 Units, Subcutaneous, ACHS (22)  insulin glargine (LANTUS SOLOSTAR) injection PEN 5 Units, 5 Units, Subcutaneous, QHS  lactulose oral solution 20 g, 30 mL, Oral, Q4H    Or  lactulose(#) 200 g/300 mL rectal enema 200 g, 200 g, Rectal, Q4H  latanoprost (XALATAN) 0.005 % ophthalmic solution 1 drop,  1 drop, Both Eyes, QHS  melatonin tablet 3 mg, 3 mg, Oral, QHS  midodrine (PROAMATINE) tablet 5 mg, 5 mg, Oral, TID  pantoprazole DR (PROTONIX) tablet 20 mg, 20 mg, Oral, QDAY    Continuous Infusions:  PRN and Respiratory Meds:acetaminophen Q6H PRN, bisacodyL BID PRN, prochlorperazine Q6H PRN **OR** prochlorperazine Q6H PRN, sodium chloride 0.9% (NS) PRN       No Known Allergies    Prior Level of Function     Self-Care/ADLs:  Independent   Mobility: independent at community level without assistive device     Home Environment:  Home Situation: Lives with Family (spouse and mother) (03/11/2019 10:00 AM)    Patient Owned Equipment: Roller Walker (03/11/2019 10:00 AM)    Type of Home: House (split level) (03/11/2019 10:00 AM)    Entry Stairs: Stair Glide (03/11/2019 10:00 AM)    In-Home Stairs: Stair Glide (03/11/2019 10:00 AM)    Comments: PT familiar with patient from previous admission, after which patient discharged to [facility] prior to returning to acute setting with new cerebellar infarct. Patient was about to discharge home from [facility] prior to onset of new CVA. Patient visibly and very understandably disappointed by these recent events. Both patient and spouse very pleasant and agreeable to work with PT this afternoon.  (02/22/2019  2:00 PM)    Bathroom Equipment: Binnie Rail in [employer];Shower Chair (02/24/2019 10:00 AM)      Current Level Of Function:   PT        Bed Mobility/Transfers  Bed Mobility: Supine to Sit: Maximum Assist, Head of Bed Elevated, Assist with B LE, Assist with Trunk, Use of Rail  Bed Mobility: Sit to Supine: Moderate Assist, x2 People, Assist with Trunk, Assist with B LE, Bed Flat  Transfer Type: Sit to Stand  Transfer: Assistance Level: From, Bed, Moderate Assist, x2 People  Transfer: Assistive Device: Chief of Staff Assist  Transfers: Type Of Assistance: Verbal Cues, Knees(s) Blocked, For Balance, For Strength Deficit, For Safety Considerations  End Of Activity Status: In Bed, Nursing Notified, Instructed Patient to Request Assist with Mobility, Instructed Patient to Use Call Light(Chair mode)  Comments: TED hose and ab binder donned;  BP stable supine to sitting.  Complains of increased mild dizziness in sitting. Increased with standing.     OT     SLP COGNITIVE EVALUATION SUMMARY     PRAGMATICS:    BEHAVIOR:    AUDITORY COMPREHENSION:      ORIENTATION:    AUDITORY ATTENTION/WORKING MEMORY:    AUDITORY MEMORY/SUSTAINED ATTENTION:    NEW LEARNING:    SEQUENCING/ORGANIZATION:    PROBLEM SOLVING:    REASONING:      MATH/MONEY SKILLS:      VISUAL PERCEPTUAL:    SWALLOW EVALUATION SUMMARY  Summary: A clinical swallow evaluation was completed this date.  Oral Stage Summary*: PO trials of ice chips, thin liquids (via tsp/cup/straw), purees, mechanical soft, and regular solids provided. Bolus withdraw, transfer, and mastication appeared [facility]. No anterior bolus loss nor oral residue. Pt did attempt to masticate purees.   Pharyngeal Stage Summary*: Suspect risk for mistiming of the oropharyngeal swallow; though no s/s of aspiration noted.  Swallow Recommendations*  PO: Regular, Thin Liquids  Swallow Strategies: Small Bites/Sips, Slow Rate of Intake  Plan: Continue Treatment __x/week (Comment).(3-5x)  Prognosis: Good     Review of Systems     A 14 point review of systems was negative except for: that noted in the HPI    Physical Exam  BP: 135/80 (08/28 0926)  Temp: 36.8 ???C (98.3 ???F) (08/28 2130)  Pulse: 88 (08/28 0926)  Respirations: 17 PER MINUTE (08/28 0926)  SpO2: 98 % (08/28 0926)  Body mass index is 27.46 kg/m???.     Gen: awake, alert, NAD  HEENT: NCAT, EOMI Neck: Supple and symmetric  Heart: Extremities are well perfused  Lungs: respirations even and non-labored on room air  Abdomen: Soft, non-distended  Psych: pleasant mood/appropriate affect  Ext: No peripheral edema  MS: LUE grossly full strength, RUE partial antigravity strength, RLE less than antigravity strength.  Neuro: very sleepy but follows commands for exam. Significant right sided ataxia with inability to perform finger to nose on right before falling asleep.    Intake/Output Summary (Last 24 hours) at 03/11/2019 1220  Last data filed at 03/11/2019 1100  Gross per 24 hour   Intake 440 ml   Output 0 ml   Net 440 ml        Hematology:    Lab Results   Component Value Date    HGB 11.9 03/11/2019    HCT 35.0 03/11/2019    PLTCT 158 03/11/2019    WBC 7.9 03/11/2019    NEUT 50 03/09/2019    ANC 3.40 03/09/2019    ALC 2.43 03/09/2019    MONA 9 03/09/2019    AMC 0.63 03/09/2019    ABC 0.06 03/09/2019    MCV 88.3 03/11/2019    MCHC 34.0 03/11/2019    MPV 7.6 03/11/2019    RDW 14.7 03/11/2019   ,   Coagulation:    Lab Results   Component Value Date    PTT 32.2 03/10/2019    INR 1.3 03/11/2019      General Chemistry:    Lab Results   Component Value Date    NA 139 03/11/2019    K 3.9 03/11/2019    CL 104 03/11/2019    GAP 7 03/11/2019    BUN 17 03/11/2019    CR 0.68 03/11/2019    GLU 90 03/11/2019    CA 8.5 03/11/2019    ALBUMIN 2.6 03/11/2019    LACTIC 0.8 03/10/2019    MG 1.5 03/11/2019    TOTBILI 2.0 03/11/2019        Radiology:    Reviewed    Mri Head Wo Contrast    Result Date: 03/09/2019  EXAM: MRI BRAIN HISTORY: , recent history recurrent stroke, waxing/ waning functional changes, TECHNIQUE: Multiplanar and multisequence MR imaging of the head was performed.   COMPARISON: MRI brain February 01, 2019 and February 21, 2019 FINDINGS: Dr. Desma Maxim, M.D. has personally reviewed these images and formulated the interpretations and opinions expressed in this report. Specular evolution of late subacute-chronic left lateral pontine-brachium pontis infarct with associated wallerian degeneration in the left brachium pontis. Interval progression of now moderate-sized acute-early subacute right superior cerebellar-right brachium pontis infarcts. Right lateral tectal-superior cerebellar peduncle acute or early subacute infarct is noted. Numerous dilated perivascular spaces are noted throughout the basal nuclei. Patchy marked areas of FLAIR hyperintensity throughout the supratentorial white matter are unchanged. Small old superimposed right pontine infarct is again noted. There is no hemorrhagic conversion. No midline shift or significant intracranial mass effect. There is stable mild generalized cerebral volume loss. Vascular flow-voids are grossly unremarkable.     1.  Progression of now moderate acute-early subacute right superior cerebellar-brachium pontis infarcts (right SCA territory). New tiny acute or early subacute right tectal-superior cerebellar peduncle infarct. No hemorrhagic conversion or significant associated  mass effect. 2.  Expected evolution of late subacute-chronic left lateral pontine infarct with wallerian degeneration in the left brachium pontis. 3.  Persistent mild generalized cerebral volume loss and marked nonspecific supratentorial white matter disease, likely due to chronic small vessel ischemic changes. Discussed with Dr.Arickx by telephone at 4:35 PM 03/09/2019  Finalized by Desma Maxim, M.D. on 03/09/2019 4:38 PM. Dictated by Desma Maxim, M.D. on 03/09/2019 4:28 PM.     Mri Head Wo Contrast    Result Date: 02/21/2019  MRI head HISTORY: Stroke, suspected, dysarthria, right-sided weakness, left-sided weakness, decreased level of consciousness TECHNIQUE: Multiplanar and multisequence MR imaging of the head was performed.   COMPARISON: Same-day CTA head and neck and CT perfusion, MRI brain 02/01/2019 FINDINGS: Small patchy areas of diffusion restriction within the right superior cerebellum consistent with acute to subacute infarcts. Persistent FLAIR hyperintensity in the left middle cerebellar peduncle and left lateral pons, a portion which anteriorly continues to demonstrate diffusion restriction/reduction. There is no significant associated volume loss, with probable mild associated expansile appearance. Ventricles and subarachnoid spaces otherwise within normal limits. Patchy and confluent FLAIR hyperintensity in the supratentorial white matter and pons consistent with moderate nonspecific white matter disease. Tiny old right cerebellar infarct. There is no midline shift or herniation. Refer to same-day CTA for discussion of the arterial structures. Vascular flow-voids are otherwise grossly unremarkable. No evidence of abnormal intracranial susceptibility. Ocular lens replacements. Trace bilateral mastoid fluid. T1 intermediate to hypointense marrow signal in the upper cervical spine with sparing of the skull base and calvarium, likely due to benign marrow replacement such as red marrow conversion.     1.  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 2.  Similar appearance of mildly expansile FLAIR hyperintensity in the left middle cerebellar peduncle and left pons, a portion of which demonstrates restricted/reduced diffusion. The appearance is not typical for evolving infarct. This finding remains nonspecific and given lack of evolution may represent neoplasm (such as low-grade glioma or possibly anaplastic astrocytoma given diffusion abnormality). Other subacute demyelination or infectious/inflammatory insult could produce this appearance. Short-term follow-up exam in 6-8 weeks without and with contrast recommended for reevaluation. 3. Persistent moderate nonspecific white matter disease, likely secondary to chronic microvascular ischemia.  Finalized by Jason Coop, M.D. on 02/21/2019 11:08 PM. Dictated by Jason Coop, M.D. on 02/21/2019 10:58 PM.     Shirlee Latch Head W Contrast    Result Date: 02/23/2019  MRI head HISTORY: Neurologic deficit. TECHNIQUE: Multiplanar, multisequence MRI images were obtained through the head with contrast. MultiHance was injected. FINDINGS: Comparison is made with the noncontrast MRI images from yesterday as well as CTA images from yesterday. Correlation is also made with prior July MRI. The postcontrast images demonstrate trace enhancement at the peripheral margins of the wedge-shaped signal abnormality at the left pons (image 53 of series 7. There is no enhancement within the adjacent FLAIR hyperintense signal abnormality in the left middle cerebellar peduncle. There is no enhancement within the new superior right cerebellar and right middle cerebellar peduncle There is no supratentorial abnormal enhancement.     1. Minimal thin enhancement at the left pons wedge-shaped signal abnormality. No enhancement of the adjacent left middle cerebellar peduncle FLAIR hyperintensity. The attending differential consideration is a slowly evolving subacute to chronic infarct. Neoplasm or sequelae of infectious or demyelinating etiology are thought less likely.  Attention on follow-up is suggested. 2. No enhancement of the right middle cerebellar peduncle FLAIR/diffusion abnormality consistent with an infarct.  Finalized by Marily Memos, M.D. on 02/23/2019 8:36 AM. Dictated by Marily Memos, M.D. on 02/23/2019 8:17 AM.     Levy Sjogren Head Wo/w Contr+post Pro    Result Date: 02/21/2019  EXAM: CTA HEAD AND NECK, CTA BRAIN PERFUSION HISTORY: Dysarthria, Stroke, suspected, TECHNIQUE: Multiple contiguous axial images were obtained of the brain and neck following the administration of IV contrast. CTA maximum density projection images were obtained of the brain and neck with image post processing.  Perfusion imaging was also obtained with CBV, MTT, TTD, and CBF mapping. COMPARISON: CTA head and neck 01/02/2019, MRI brain 01/02/2019 FINDINGS: CTA head: There is stable size and configuration of the ventricles and subarachnoid spaces. There is patchy and confluent cerebral white matter and left middle cerebellar peduncle hypodensity. No hyperdense intracranial hemorrhage. No mass effect. Persistent diffuse calcified plaque within the cavernous and paraclinoid ICAs and supraclinoid ICAs, greater on the left. This results in at least mild multifocal narrowing of the left ICA, unchanged. The right A1 segment is hypoplastic with a dominant widely patent left A1 segment. There is persistent mild narrowing at the left M1 origin. The anterior and middle cerebral arteries are otherwise patent without occlusion or high-grade stenosis. Persistent dense calcified plaque involving bilateral intracranial vertebral arteries resulting in at least moderate focal stenosis of the left vertebral artery at least mild stenosis of the right vertebral artery. Persistent irregularity and punctate calcified plaque in the basilar artery without stenosis. There are very very small caliber AICA is noted. There are severe stenoses or occlusions of the origins of the superior cerebellar arteries bilaterally, unchanged. The left posterior cerebral artery is patent with occlusion or stenosis. Within the right P2 segment, there is an unchanged short segment area of marked stenosis or occlusion (series 10 image [id number]). 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 Atherosclerotic calcification of the origin of the arch vessels without significant stenosis. Atherosclerotic calcification at the origin of the right vertebral artery origin resulting in at least mild stenosis. Mixed, though predominantly soft plaque atherosclerosis at the right ICA origin resulting in 50% stenosis by NASCET criteria. Atherosclerotic calcification at the left carotid bifurcation and proximal left internal carotid artery results in less than 50% stenosis by NASCET criteria. No aneurysm, AVM, or dissection is identified. Prior ACDF of C6-C7. Cervical spondylosis with multilevel neuroforaminal stenosis to marked degree at C3-C4, C4-C5 bilaterally, and C5-6 on the right. Multilevel mild degenerative central spinal stenosis. Dental and periodontal disease with scattered dental caries and periapical lucencies at teeth numbers teeth #1, 8, 25-26. The mastoid air cells are clear. Minimal right maxillary mucosal thickening. Coronary artery calcification and left lung calcified granulomas. CT Perfusion: There is a small mismatch perfusion defects localizing to the right superior cerebellum with elevated maximal transit time and normal blood flow and volume parameters.     CTA head: 1.  No acute intracranial hemorrhage or mass effect. 2.  No large vessel occlusion. Persistent multifocal atherosclerotic stenoses of the left ICA and left M1 middle cerebral artery. 3.  Unchanged focal occlusions or severe stenoses involving bilateral superior cerebellar arteries and the P2 segment of the right posterior cerebral artery. 4.  Mild and moderate atherosclerotic stenoses of the vertebral arteries. 5.  Persistent moderate cerebral white matter chronic microvascular ischemic change. 6.  Persistent  hyperdensity within the left middle cerebellar, presumably related to a prior related to prior ischemic change. Gliotic sequelae of a prior nonspecific infarct versus appearance. CTA neck: Unchanged exam. 50% right ICA and less than 50% left ICA atherosclerotic narrowing by NASCET criteria. Mild narrowing of the right vertebral artery origin. CT perfusion: Mismatched perfusion localizing to the right superior cerebellar artery territory, likely related to known severe stenosis or occlusion of the right superior cerebellar artery. Noncontrast CT head findings were discussed with Dr. Celene Kras by Dr. Patrici Ranks by telephone at 3:16 PM 02/21/2019. Advanced imaging findings were discussed with Dr. Celene Kras by Dr. Patrici Ranks at 3:28 AM and 3:33 PM 02/21/2019  Finalized by Dimple Casey, M.D. on 02/21/2019 3:48 PM. Dictated by Dimple Casey, M.D. on 02/21/2019 3:14 PM.     Cta Neck Wo/w Contrast+post P    Result Date: 02/21/2019  EXAM: CTA HEAD AND NECK, CTA BRAIN PERFUSION HISTORY: Dysarthria, Stroke, suspected, TECHNIQUE: Multiple contiguous axial images were obtained of the brain and neck following the administration of IV contrast. CTA maximum density projection images were obtained of the brain and neck with image post processing.  Perfusion imaging was also obtained with CBV, MTT, TTD, and CBF mapping. COMPARISON: CTA head and neck 01/02/2019, MRI brain 01/02/2019 FINDINGS: CTA head: There is stable size and configuration of the ventricles and subarachnoid spaces. There is patchy and confluent cerebral white matter and left middle cerebellar peduncle hypodensity. No hyperdense intracranial hemorrhage. No mass effect. Persistent diffuse calcified plaque within the cavernous and paraclinoid ICAs and supraclinoid ICAs, greater on the left. This results in at least mild multifocal narrowing of the left ICA, unchanged. The right A1 segment is hypoplastic with a dominant widely patent left A1 segment. There is persistent mild narrowing at the left M1 origin. The anterior and middle cerebral arteries are otherwise patent without occlusion or high-grade stenosis. Persistent dense calcified plaque involving bilateral intracranial vertebral arteries resulting in at least moderate focal stenosis of the left vertebral artery at least mild stenosis of the right vertebral artery. Persistent irregularity and punctate calcified plaque in the basilar artery without stenosis. There are very very small caliber AICA is noted. There are severe stenoses or occlusions of the origins of the superior cerebellar arteries bilaterally, unchanged. The left posterior cerebral artery is patent with occlusion or stenosis. Within the right P2 segment, there is an unchanged short segment area of marked stenosis or occlusion (series 10 image [id number]). 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 Atherosclerotic calcification of the origin of the arch vessels without significant stenosis. Atherosclerotic calcification at the origin of the right vertebral artery origin resulting in at least mild stenosis. Mixed, though predominantly soft plaque atherosclerosis at the right ICA origin resulting in 50% stenosis by NASCET criteria. Atherosclerotic calcification at the left carotid bifurcation and proximal left internal carotid artery results in less than 50% stenosis by NASCET criteria. No aneurysm, AVM, or dissection is identified. Prior ACDF of C6-C7. Cervical spondylosis with multilevel neuroforaminal stenosis to marked degree at C3-C4, C4-C5 bilaterally, and C5-6 on the right. Multilevel mild degenerative central spinal stenosis. Dental and periodontal disease with scattered dental caries and periapical lucencies at teeth numbers teeth #1, 8, 25-26. The mastoid air cells are clear. Minimal right maxillary mucosal thickening. Coronary artery calcification and left lung calcified granulomas. CT Perfusion: There is a small mismatch perfusion defects localizing to the right superior cerebellum with elevated  maximal transit time and normal blood flow and volume parameters.     CTA head: 1.  No acute intracranial hemorrhage or mass effect. 2.  No large vessel occlusion. Persistent multifocal atherosclerotic stenoses of the left ICA and left M1 middle cerebral artery. 3.  Unchanged focal occlusions or severe stenoses involving bilateral superior cerebellar arteries and the P2 segment of the right posterior cerebral artery. 4.  Mild and moderate atherosclerotic stenoses of the vertebral arteries. 5.  Persistent moderate cerebral white matter chronic microvascular ischemic change. 6.  Persistent hyperdensity within the left middle cerebellar, presumably related to a prior related to prior ischemic change. Gliotic sequelae of a prior nonspecific infarct versus appearance. CTA neck: Unchanged exam. 50% right ICA and less than 50% left ICA atherosclerotic narrowing by NASCET criteria. Mild narrowing of the right vertebral artery origin. CT perfusion: Mismatched perfusion localizing to the right superior cerebellar artery territory, likely related to known severe stenosis or occlusion of the right superior cerebellar artery. Noncontrast CT head findings were discussed with Dr. Celene Kras by Dr. Patrici Ranks by telephone at 3:16 PM 02/21/2019. Advanced imaging findings were discussed with Dr. Celene Kras by Dr. Patrici Ranks at 3:28 AM and 3:33 PM 02/21/2019  Finalized by Dimple Casey, M.D. on 02/21/2019 3:48 PM. Dictated by Dimple Casey, M.D. on 02/21/2019 3:14 PM.     Ct Head Wo Contrast    Result Date: 03/09/2019  EXAM: CT HEAD HISTORY: Male, 68 years old, stroke; dysarthria, generalized weakness, altered mental status TECHNIQUE: Multiple contiguous axial images were obtained of the brain without intravenous contrast. COMPARISON: MRI head from earlier same-day, MRI brain 02/21/2019, CTA head and neck and brain perfusion 02/21/2019 FINDINGS: Redemonstration of recent superior right cerebellar infarct. The small new area of acute to subacute ischemia in the right tectum and superior cerebellar peduncle is of similar density to the adjacent right SCA territory infarct and also likely subacute (such as series 2 image 14). Redemonstration of late subacute to chronic left pontine infarct with evolving wallerian degeneration of the left brachium pontis. The ventricles and subarachnoid spaces are otherwise stable in size and configuration. Patchy and confluent low-attenuation in the supratentorial white matter and pons consistent with moderate to marked nonspecific white matter disease. Dilated perivascular spaces in the basal nuclei, better demonstrated on MRI. Supratentorial gray-white matter interfaces are otherwise maintained. There is no midline shift or herniation. The basal cisterns are patent. There is no evidence of acute intracranial hemorrhage or extra-axial fluid collection. The calvarium is intact. Trace bilateral mastoid effusions. The paranasal sinuses are well aerated. Dental and periodontal disease. Bilateral ocular lens replacement. Globes and orbits are otherwise unremarkable.     1.  Redemonstration of recent right superior cerebellar artery territory infarct. The new small area of infarct (on same-day MRI) in the right superior cerebellar peduncle and tectum is of similar density to the adjacent SCA territory infarct and is also likely subacute. 2.  Redemonstration of subacute to chronic left pontine infarct with evolving wallerian degeneration of the left brachium pontis. 3.  Moderate to marked nonspecific white matter disease, likely due to chronic microvascular ischemia. 4.  No acute intracranial hemorrhage. Meyer Russel, M.D., in consultation with Dr. Jason Coop, discussed these findings with Dr. Elenora Fender by telephone on 03/09/2019 7:26 PM. By my electronic signature, I attest that I have personally reviewed the images for this examination and formulated the interpretations and opinions expressed in this report  Finalized by Jason Coop, M.D. on 03/09/2019 7:33 PM. Dictated by Meyer Russel, M.D.  on 03/09/2019 7:08 PM.     Chest Single View    Result Date: 03/09/2019 Chest single view HISTORY: Stroke activation COMPARISON: Chest x-ray 03/08/2019 FINDINGS: The heart size and pulmonary vasculature are within normal limits. Persistent mild elevation of the right hemidiaphragm with improvement in patchy bibasilar opacities, now minimal. No new consolidation, pneumothorax, or significant pleural effusion identified. ACDF is noted. Thoracic spine osteophytes.     Improvement in patchy bibasilar opacities, now minimal, likely atelectasis. No new acute cardiopulmonary findings.  Finalized by Jason Coop, M.D. on 03/09/2019 7:46 PM. Dictated by Jason Coop, M.D. on 03/09/2019 7:45 PM.     Chest Single View    Result Date: 03/08/2019  CHEST SINGLE VIEW Indication: infection. Comparison: February 21, 2019 Findings: There is a decreased depth of inspiration. The heart and pulmonary vasculature are unremarkable. Bibasilar opacities are noted, likely atelectasis. Infection or aspiration is not excluded. No significant pleural effusion or pneumothorax is identified. Prior cervical ACDF is noted.     Decreased depth of inspiration with bibasilar opacities, likely atelectasis. Infection or aspiration cannot be excluded.  Finalized by Arlana Hove, M.D. on 03/08/2019 3:47 PM. Dictated by Arlana Hove, M.D. on 03/08/2019 3:47 PM.     Chest Single View    Result Date: 02/21/2019  CHEST SINGLE VIEW History: Right-sided and left-sided weakness.. Technique: Single portable AP upright view of the chest was obtained. Comparison: Chest radiograph January 31, 2019. Findings: Decreased inspiration. The cardiac silhouette is normal in size. There is no pulmonary vascular congestion. There is mild blunting of the right costophrenic angle and bibasilar opacities. No significant pleural effusion or pneumothorax. Aortic atherosclerosis. Prior cervical ACDF.     Decreased inspiration with bibasilar right greater than left opacities which may represent atelectasis or aspiration. By my electronic signature, I attest that I have personally reviewed the images for this examination and formulated the interpretations and opinions expressed in this report  Finalized by Ancil Boozer, M.D. on 02/21/2019 5:05 PM. Dictated by Alvester Morin, M.D. on 02/21/2019 4:26 PM.     US Abdomen Complete    Result Date: 03/10/2019  ABDOMINAL ULTRASOUND WITH DOPPLER CLINICAL INDICATION: Male, 68 years old; hepatic encephalopathy, abdominal distention. Evaluate for ascites. TECHNIQUE: Multiple grayscale, color Doppler and spectral Doppler ultrasound images were obtained through the abdomen. COMPARISON: CT abdomen 02/03/2019 FINDINGS: Liver and Biliary System:  Small, cirrhotic liver with diffuse parenchymal heterogeneity and nodular contour. There is a solid, mildly hypoechoic mass at the right hepatic dome, likely corresponding to known hepatoma. Two additional solid, isoechoic observations are noted elsewhere in the liver, measuring up to 1.1 cm in diameter No intrahepatic bile duct dilatation. The common duct is not confidently identified at the porta hepatis The gallbladder is nondilated and contains tiny calculi and sludge. Main portal vein: Slow, antegrade flow with peak velocity 13 cm/s. Right and left portal veins:  Patent, antegrade flow. Splenic vein: Normal direction of flow at midline and splenic hilum. IVC: Patent, normal pulsatility.  Hepatic veins: Patent, normal pulsatility.  Hepatic arteries: Normal systolic acceleration; PHA resistive index is 0.7, PSV is 51 cm/sec. Pancreas: Obscured by bowel gas.  Spleen: Mildly enlarged measuring 13.8 cm.  Kidneys: The right kidney measures 12.1 cm, though the lower pole is partially obscured by bowel gas artifact.  The left kidney measures 12.2 cm.  No hydronephrosis. Bladder: Partially distended. There is circumferential wall thickening and trabeculation. Aorta: Visualized portions are normal in caliber. Peritoneal Space: Mild abdominopelvic ascites. 1.  Patent hepatic vessels with  normal direction of blood flow. 2.  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 There are 2 smaller observations elsewhere in the liver which are subthreshold for imaging characterization, possibly small regenerative nodules. These can be reevaluated on surveillance CT/MR studies for known hepatoma. 3.  Portal hypertension, with mild splenomegaly and small volume ascites. 4.  Cholelithiasis and gallbladder sludge. 5.  Circumferential bladder wall thickening, which is nonspecific though often related to chronic bladder outlet obstruction or cystitis.  Finalized by Judie Petit, M.D. on 03/10/2019 7:36 AM. Dictated by Judie Petit, M.D. on 03/10/2019 7:27 AM.     US Doppler Abd Pelv Retroper Comp    Result Date: 03/10/2019  ABDOMINAL ULTRASOUND WITH DOPPLER CLINICAL INDICATION: Male, 68 years old; hepatic encephalopathy, abdominal distention. Evaluate for ascites. TECHNIQUE: Multiple grayscale, color Doppler and spectral Doppler ultrasound images were obtained through the abdomen. COMPARISON: CT abdomen 02/03/2019 FINDINGS: Liver and Biliary System:  Small, cirrhotic liver with diffuse parenchymal heterogeneity and nodular contour. There is a solid, mildly hypoechoic mass at the right hepatic dome, likely corresponding to known hepatoma. Two additional solid, isoechoic observations are noted elsewhere in the liver, measuring up to 1.1 cm in diameter No intrahepatic bile duct dilatation. The common duct is not confidently identified at the porta hepatis The gallbladder is nondilated and contains tiny calculi and sludge. Main portal vein: Slow, antegrade flow with peak velocity 13 cm/s. Right and left portal veins:  Patent, antegrade flow. Splenic vein: Normal direction of flow at midline and splenic hilum. IVC: Patent, normal pulsatility.  Hepatic veins: Patent, normal pulsatility.  Hepatic arteries: Normal systolic acceleration; PHA resistive index is 0.7, PSV is 51 cm/sec. Pancreas: Obscured by bowel gas.  Spleen: Mildly enlarged measuring 13.8 cm.  Kidneys: The right kidney measures 12.1 cm, though the lower pole is partially obscured by bowel gas artifact.  The left kidney measures 12.2 cm.  No hydronephrosis. Bladder: Partially distended. There is circumferential wall thickening and trabeculation. Aorta: Visualized portions are normal in caliber. Peritoneal Space: Mild abdominopelvic ascites.     1.  Patent hepatic vessels with normal direction of blood flow. 2.  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 There are 2 smaller observations elsewhere in the liver which are subthreshold for imaging characterization, possibly small regenerative nodules. These can be reevaluated on surveillance CT/MR studies for known hepatoma. 3.  Portal hypertension, with mild splenomegaly and small volume ascites. 4.  Cholelithiasis and gallbladder sludge. 5.  Circumferential bladder wall thickening, which is nonspecific though often related to chronic bladder outlet obstruction or cystitis.  Finalized by Judie Petit, M.D. on 03/10/2019 7:36 AM. Dictated by Judie Petit, M.D. on 03/10/2019 7:27 AM.     Ct Brain Perf    Result Date: 02/21/2019  EXAM: CTA HEAD AND NECK, CTA BRAIN PERFUSION HISTORY: Dysarthria, Stroke, suspected, TECHNIQUE: Multiple contiguous axial images were obtained of the brain and neck following the administration of IV contrast. CTA maximum density projection images were obtained of the brain and neck with image post processing.  Perfusion imaging was also obtained with CBV, MTT, TTD, and CBF mapping. COMPARISON: CTA head and neck 01/02/2019, MRI brain 01/02/2019 FINDINGS: CTA head: There is stable size and configuration of the ventricles and subarachnoid spaces. There is patchy and confluent cerebral white matter and left middle cerebellar peduncle hypodensity. No hyperdense intracranial hemorrhage. No mass effect. Persistent diffuse calcified plaque within the cavernous and paraclinoid ICAs and supraclinoid ICAs, greater on the left. This results  in at least mild multifocal narrowing of the left ICA, unchanged. The right A1 segment is hypoplastic with a dominant widely patent left A1 segment. There is persistent mild narrowing at the left M1 origin. The anterior and middle cerebral arteries are otherwise patent without occlusion or high-grade stenosis. Persistent dense calcified plaque involving bilateral intracranial vertebral arteries resulting in at least moderate focal stenosis of the left vertebral artery at least mild stenosis of the right vertebral artery. Persistent irregularity and punctate calcified plaque in the basilar artery without stenosis. There are very very small caliber AICA is noted. There are severe stenoses or occlusions of the origins of the superior cerebellar arteries bilaterally, unchanged. The left posterior cerebral artery is patent with occlusion or stenosis. Within the right P2 segment, there is an unchanged short segment area of marked stenosis or occlusion (series 10 image [id number]). 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 Atherosclerotic calcification of the origin of the arch vessels without significant stenosis. Atherosclerotic calcification at the origin of the right vertebral artery origin resulting in at least mild stenosis. Mixed, though predominantly soft plaque atherosclerosis at the right ICA origin resulting in 50% stenosis by NASCET criteria. Atherosclerotic calcification at the left carotid bifurcation and proximal left internal carotid artery results in less than 50% stenosis by NASCET criteria. No aneurysm, AVM, or dissection is identified. Prior ACDF of C6-C7. Cervical spondylosis with multilevel neuroforaminal stenosis to marked degree at C3-C4, C4-C5 bilaterally, and C5-6 on the right. Multilevel mild degenerative central spinal stenosis. Dental and periodontal disease with scattered dental caries and periapical lucencies at teeth numbers teeth #1, 8, 25-26. The mastoid air cells are clear. Minimal right maxillary mucosal thickening. Coronary artery calcification and left lung calcified granulomas. CT Perfusion: There is a small mismatch perfusion defects localizing to the right superior cerebellum with elevated maximal transit time and normal blood flow and volume parameters.     CTA head: 1.  No acute intracranial hemorrhage or mass effect. 2.  No large vessel occlusion. Persistent multifocal atherosclerotic stenoses of the left ICA and left M1 middle cerebral artery. 3.  Unchanged focal occlusions or severe stenoses involving bilateral superior cerebellar arteries and the P2 segment of the right posterior cerebral artery. 4.  Mild and moderate atherosclerotic stenoses of the vertebral arteries. 5.  Persistent moderate cerebral white matter chronic microvascular ischemic change. 6.  Persistent hyperdensity within the left middle cerebellar, presumably related to a prior related to prior ischemic change. Gliotic sequelae of a prior nonspecific infarct versus appearance. CTA neck: Unchanged exam. 50% right ICA and less than 50% left ICA atherosclerotic narrowing by NASCET criteria. Mild narrowing of the right vertebral artery origin. CT perfusion: Mismatched perfusion localizing to the right superior cerebellar artery territory, likely related to known severe stenosis or occlusion of the right superior cerebellar artery. Noncontrast CT head findings were discussed with Dr. Celene Kras by Dr. Patrici Ranks by telephone at 3:16 PM 02/21/2019. Advanced imaging findings were discussed with Dr. Celene Kras by Dr. Patrici Ranks at 3:28 AM and 3:33 PM 02/21/2019  Finalized by Dimple Casey, M.D. on 02/21/2019 3:48 PM. Dictated by Dimple Casey, M.D. on 02/21/2019 3:14 PM.     Ir Aspiration/drain    Result Date: 03/10/2019  Ultrasound-guided diagnostic and therapeutic paracentesis CLINICAL INDICATION: Ascites MEDICATIONS:  I was personally responsible for the administration of moderate sedation services during the procedure performed and I confirm requirements described in CPT section  on moderate sedation were followed, including the use of an independent trained observer who had no other duties during the procedure.   See nursing log for complete details; the drugs utilized were:   subcutaneous Lidocaine 2% OPERATOR: Marcelline Mates, M.D., Robbie Lis MD TECHNIQUE: Transverse real time images were obtained through the abdomen. The risks and benefits of this procedure were discussed and informed written consent was obtained prior to performing the procedure. The abdomen was then prepped and draped in usual sterile fashion.  Limited ultrasound of the abdomen was performed.  Under ultrasound guidance, a 5 French centesis needle was advanced into the peritoneal fluid collection and catheter advanced into the collection over the needle, and needle was removed. The catheter was connected to Vacutainer bottles and 3000 mL fluid was removed, some of which was sent for analysis. There were no immediate complications of the procedure. No significant blood loss. Patient tolerated procedure well.  FINDINGS:  Free peritoneal fluid demonstrated on ultrasound.     Successful ultrasound guided diagnostic and therapeutic paracentesis. I, Robbie Lis, M.D, the attending radiologist, was present for the critical and key portions of the procedure with a midlevel, resident, and/or fellow participating.  Overlapping portions were non key and I was immediately available.  I interpret the critical and key portion of this procedure to have been needle access. @TT   Approved by Marcelline Mates, M.D. on 03/10/2019 12:23 PM By my electronic signature, I attest that I have personally reviewed the images for this examination and formulated the interpretations and opinions expressed in this report  Finalized by ADAM ALLI on 03/10/2019 1:49 PM. Dictated by Marcelline Mates, M.D. on 03/10/2019 12:23 PM.        Ok Anis, MD

## 2019-03-11 NOTE — Progress Notes
PHYSICAL THERAPY  ASSESSMENT      Name: Mitchell Lucero        MRN: [id number]          DOB: 03-Sep-1950          Age: 68 y.o.  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 Patient admitted to medicine for hepatic encephalopathy, progression/acute stroke.   Mental / Cognitive Status: Alert;Oriented;To Person;Cooperative;Follows Commands  Persons Present: Spouse;Nursing Staff  Pain: Patient has no complaint of pain  Comments: Ted hose and abdominal binder donned prior to upright mobility (history of orthostatic hypotension and multiple drop falls prior to previous admission).   Comments: 1L 02 NC  Ambulation Assist: Independent Mobility at Household Level with Device;Assist Needed with Mobility-Related ADL's/Ambulation  Patient Owned Equipment: Nurse, adult  Home Situation: Lives with Family(spouse and mother)  Type of Home: House(split level)  Entry Stairs: Stair Glide  In-Home Stairs: Stair Glide    ROM  ROM Method: Active Assistive  LE ROM: Bilateral;WFL    Strength  Strength Position Assessed: Seated;Supine  R Hip Flexion: 2/5  R Knee Extension: 2+/5  R Ankle Dorsiflexion: 2-/5  L Hip Flexion: 3/5  L Knee Flexion: 3-/5  L Knee Extension: 3-/5  L Ankle Dorsiflexion: 3-/5    Posture/Neurological  Head Control: Independent Bed Mobility/Transfer  Bed Mobility: Supine to Sit: Maximum Assist;Head of Bed Elevated;Assist with B LE;Assist with Trunk;Use of Rail  Bed Mobility: Sit to Supine: Moderate Assist;x2 People;Assist with Trunk;Assist with B LE;Bed Flat  Transfer Type: Sit to Stand  Transfer: Assistance Level: From;Bed;Moderate Assist;x2 People  Transfer: Assistive Device: Doctor, general practice  Transfers: Type Of Assistance: Verbal Cues;Knees(s) Blocked;For Balance;For Strength Deficit;For Safety Considerations  End Of Activity Status: In Bed;Nursing Notified;Instructed Patient to Request Assist with Mobility;Instructed Patient to Use Call Light(Chair mode)  Comments: TED hose and ab binder donned;  BP stable supine to sitting.  Complains of increased mild dizziness in sitting. Increased with standing.      Balance  Sitting Balance: Static Sitting Balance;2 UE Support;Minimal Assist;Dynamic Sitting Balance;Moderate Assist(Leans to R side; increased with fatigue)    Activity/Exercise  Sit Edge Of Bed: 10 minutes  Sit Edge Of Bed Assist: Minimal Assist;Moderate  Assist  Stand At Bedside : (15 seconds)  Stand At Bedside Assist: Moderate  Assist;x2 People    Education  Persons Educated: Patient/Family  Patient Barriers To Learning: None Noted  Teaching Methods: Verbal Instruction  Patient Response: Verbalized Understanding  Topics: Plan/Goals of PT Interventions;Mobility Progression;Importance of Increasing Activity;Pressure Relief;Positioning;Recommend Continued Therapy;Therapy Schedule    Assessment/Progress  Impaired Mobility Due To: Decreased Strength;Impaired Balance;Cognitive Deficits;Deconditioning;Medical Status Limitation  Assessment/Progress: Should Improve w/ Continued PT  Comments: Patient alert and following commands.  RLE >LLE weakness.  Will benefit from continued skilled therapy to address  strength/mobility deficits.     AM-PAC 6 Clicks Basic Mobility Inpatient Turning from your back to your side while in a flat bed without using bed rails: A lot  Moving from lying on your back to sitting on the side of a flatbed without using bedrails : A Lot  Moving to and from a bed to a chair (including a wheelchair): A Lot  Standing up from a chair using your arms (e.g. wheelchair, or bedside chair): A Lot  To walk in hospital room: Total  Climbing 3-5 steps with a railing: Total  Raw Score: 10  Standardized (T-scale) Score: 28.13  Basic Mobility CMS 0-100%: 71.92  CMS G Code Modifier for Basic Mobility: CL    Goals  Goal Formulation: With Patient  Time For Goal Achievement: 5 days, To, 7 days  Patient Will Go Supine To/From Sit: w/ Minimal Assist  Patient Will Transfer Bed/Chair: w/ Moderate Assist  Patient Will Transfer Sit to Stand: w/ Moderate Assist  Patient Will Achieve Sitting Balance: 2 UE Support, Standby Assist    Plan  Treatment Interventions: Mobility Training;Strengthening  Plan Frequency: 5-7 Days per Week  PT Plan for Next Visit: Bed mobility; sitting balance; sit to stands; standing balance    PT Discharge Recommendations  Recommendation: Inpatient setting;Recommend rehab medicine consult  Patient Currently Requires Physical Assist With: All mobility    Therapist  Ernestine Mcmurray, PT  Date  03/11/2019

## 2019-03-11 NOTE — Progress Notes
BRIEF UPDATE NOTE    Mitchell Lucero is a 68 y.o. 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  MRI was obtained which showed tiny area of new infarct superimposed on his previously known R cerebellar infarct.     Impression:  Pt's slow progressive decline and waxing/waning mental status is unlikely to be secondary to new small acute stroke.  Suspect his decline is secondary to a combination of underlying liver disease, prolonged hospital/rehab stay, and baseline poor CNS reserve. Plavix resistance is adequate.  Continue DAPT with ASA + Plavix for secondary stroke prevention.  Recommend to avoid hypotension given significant atherosclerotic vessel disease.      Katina Degree, DO  Vascular Neurology Fellow  Pager [phone]

## 2019-03-11 NOTE — Progress Notes
SPEECH-LANGUAGE PATHOLOGY  DAILY TREATMENT NOTE      Patient seen 1x this date.  Documentation reflects all daily treatment sessions.    SUMMARY OF THERAPY SESSION: F/u this date for ongoing oropharyngeal dysphagia management. Pt/spouse report good tolerance of regular solids/thin liquids diet without s/s of aspiration. x1 instance of coughing reported by spouse w/ lactulose, though suspected to be caused by taste rather than aspiration. Dysarthria remains, of which pt/spouse report little improvement. Speech judged to be with reduced articulatory precision and reduced loudness. Intelligibility noted to be decreased at the sentence/phrase level. Provided verbal and written education re: dysarthria. Will continue to follow, please see details below.     RECOMMENDATIONS  Regular solids and thin liquids  Medications PO as tolerated   Excellent oral care (2-3x/daily) to reduce risk of aspiration of bacteria from the oral cavity  Ongoing SLP services at next level of care    Goal : Pt will tolerate regular solids and thin liquids with less than 10% s/s of aspiration and negative pulmonary status changes.  Met  Comment: Pt/spouse endorse regular solids/thin liquids diet has been tolerated with no overt s/s of aspiration.  Spouse reports 1 instance of coughing with lactulose, suspected to be from taste rather than aspiration. No concerns reported from staff re: diet tolerance.  Continue to address this goal    New Goal: Pt will participate in ongoing education re: motor speech strategies given mild cues.     PLAN / RECOMMENDATIONS:  Continue treatment 3-5x/week    Therapist: Tommie Ard SLP Student  Date: 03/11/2019

## 2019-03-11 NOTE — Progress Notes
Shift skin assessment.     Admission skin assessment completed with: Gloris [place], RN    Pressure injury present on arrival?: No    1. Head/Face/Neck: No  2. Trunk/Back: No  3. Upper Extremities: No  4. Lower Extremities: No  5. Pelvic/Coccyx: Yes (previously charted stage 1; see doc flowsheet)  6. Assessed for device associated injury? No  7. Malnutrition Screening Tool (Nursing Nutrition Assessment) Completed? No    See Doc Flowsheet for additional wound details.     INTERVENTIONS:

## 2019-03-12 LAB — PHOSPHORUS: Lab: 2.8 mg/dL — ABNORMAL LOW (ref >60)

## 2019-03-12 LAB — POC GLUCOSE
Lab: 149 mg/dL — ABNORMAL HIGH (ref 70–100)
Lab: 179 mg/dL — ABNORMAL HIGH (ref 70–100)
Lab: 143 mg/dL — ABNORMAL HIGH (ref 70–100)
Lab: 181 mg/dL — ABNORMAL HIGH (ref 70–100)
Lab: 172 mg/dL — ABNORMAL HIGH (ref 70–100)

## 2019-03-12 LAB — COMPREHENSIVE METABOLIC PANEL: Lab: 138 MMOL/L — ABNORMAL LOW (ref 137–147)

## 2019-03-12 LAB — PROTIME INR (PT): Lab: 1.2 MMOL/L (ref >60)

## 2019-03-12 LAB — MAGNESIUM: Lab: 1.6 mg/dL — ABNORMAL LOW (ref 1.6–2.6)

## 2019-03-12 LAB — CBC: Lab: 6.9 K/UL — ABNORMAL HIGH (ref >60)

## 2019-03-12 NOTE — Consults
PALLIATIVE CARE INPATIENT NOTE     Name: Mitchell Lucero            MRN: [id number]                DOB: 1951-07-11          Age: 68 y.o.  Admission Date: 03/09/2019             LOS: 3 days    ASSESSMENT     Mitchell Lucero is a 68 y.o. male with 69 y/o man w/ CAD s/p PCI, DM, orthostatic hypotension, multiple recurrent CVA, HCC diagnosed in 01/2019, NASH cirrhosis who was recently discharged to [facility] from an admission to neurolgoy service for AMS, found to have hepatic encephalopathy and progession of subactue cerebellar infarcts.       Advance Care Planning:   Code Status: DNAR-Full Intervention  Identified Health Care Decision Maker:  Wife [name] identified by the patient, provided with DPOA form    Foisy,Teresa  Spouse  [phone]   [phone]    Trinna Post  Daughter  [phone]   [phone]    Traynor, Peter Minium    [phone]    lasater, sabrina  Daughter    [phone]      Prognosis (Estimated):  Based on current function, current medical issues, goals of care, and prognosis models, my estimated prognosis is Unknown.  Potential Disposition: Too soon to determine      PLAN       #Weight loss  #Declining functional status  #Hepatic encephalopathy   #Recurrent CVAs  # Goals of care   Discussion:   See ACP note from today for full discussion.     The patient was clear that he just wants to go home. He does not want to go anywhere to get stronger and does not want to come back to the hospital even if he got sicker, it means he would pass away. I explained that if these are his goals, hospice would be the most support he could have at home, and would help him to have the best quality of life possible for whatever time he has left.     Family are very interested in this. They say that he enjoys being at home, watching TV and generally being around his many grandchildren and great grandchildren. They are hoping that he will be able to make it to his granddaughter's wedding on September 18th. RECOMMENDATIONS:  ? Code status changed to DNR- LI.   ? Continue current treatments, per wife mental status improved significantly since yesterday  ? Wife and daughter [name], as well as Neftaly himself want to go home with hospice. They would like to wait until Monday when their other children can be a part of the conversation before sending the referral, but do not anticipate that they will disagree. Will plan for Zoom call then with palliative team.  ? Provided family with DPOA form, will need to be signed and notarized  ? Will need TPOPP when ready for discharge        SUBJECTIVE     CC/Reason for Visit: Goals of care       History of Present Illness:   Mitchell Lucero is a 68 y.o. male with 68 y/o man w/ CAD s/p PCI, DM, orthostatic hypotension, multiple recurrent CVA, HCC diagnosed in 01/2019, NASH cirrhosis who was recently discharged to [facility] from an admission to [employer] service for AMS. He was found to have hepatic encephalopathy  and progession of subactue cerebellar infarcts after being rapid responded from Rehab and stroke activated. Neurology was consulted and feel that his overall mental status changed is not due to the progression of his stroke. Hepatolgoy is also consulted and they do not feel that mental status is due to hepatic encephalopathy.   Today, Gawain say she is feeling well, denies pain, nausea and shortness of breath. He is oriented but speech is slow. His wife describes that several months ago he had been healthly and independent. After his first stroke he was able to recover somewhat but for the last week he has had an even more precipitous decline where now he can no longer stand up and has been getting more confused. She noted that he had been more constipated. Yesterday, he received an enema and she says his mental status is now much better. She and her family are interested in talking about going home with hospice.       Medical History:   Diagnosis Date ??? CAD (coronary artery disease) 1997    S/P pci   ??? 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 DM (diabetes mellitus) (HCC)    ??? Hypertension    ??? Stroke (HCC)     12/2018 (first one), 01/2019 , 02/2019   ??? Tremors of nervous system      Surgical History:   Procedure Laterality Date   ??? PERCUTANEOUS CORONARY INTERVENTION  1997   ??? HX BACK SURGERY      C6-7 ACDF (cervical)     Social History     Tobacco Use   ??? Smoking status: Never Smoker   ??? Smokeless tobacco: Former Neurosurgeon     Types: Chew   Substance Use Topics   ??? Alcohol use: Yes     Comment: occasional   ??? Drug use: Never     Social History     Social History Narrative    - Anterior cervical discectomy and fusion in 1997    - Used to work Therapist, nutritional prior to retiring     Occupation:   Marital status: married  Children:  Other significant loved ones:  Spiritual Screen: Deferred at this time    Family History:   Family History   Problem Relation Age of Onset   ??? Coronary Artery Disease Other         biological father's side   ??? Heart Disease Other    ??? Stroke Other      Family Status   Relation Name Status   ??? Other  (Not Specified)         ROS: Review of Systems   Constitutional: Positive for malaise/fatigue and weight loss.   Neurological: Positive for focal weakness.   All 12 systems otherwise negative.        OBJECTIVE     Blood pressure 117/65, pulse 98, temperature 36.8 ???C (98.2 ???F), height 160 cm (63), weight 70.3 kg (155 lb), SpO2 94 %.  Physical Exam   Constitutional: He is oriented to person, place, and time. No distress.   Chronically ill appearing   HENT:   Head: Normocephalic and atraumatic.   Eyes: Pupils are equal, round, and reactive to light. Conjunctivae and EOM are normal.   Neck: Neck supple.   ROM limited by weakness   Cardiovascular: Normal rate, regular rhythm, normal heart sounds and intact distal pulses.   Pulmonary/Chest: Effort normal and breath sounds normal. No respiratory distress. He  has no wheezes. Abdominal: Soft. Bowel sounds are normal.   Musculoskeletal:         General: No tenderness or edema.      Comments: contractures   Neurological: He is alert and oriented to person, place, and time.   Diffuse weakness, dysarthria present   Skin: Skin is warm and dry. There is pallor.   Psychiatric: Mood, memory, affect and judgment normal.   Nursing note and vitals reviewed.      Current PPS%:       Lab Results:  CBC   Lab Results   Component Value Date/Time    WBC 6.9 03/12/2019 04:57 AM    HGB 11.7 (L) 03/12/2019 04:57 AM    PLTCT 141 (L) 03/12/2019 04:57 AM     Lab Results   Component Value Date/Time    NEUT 50 03/09/2019 06:02 PM    ANC 3.40 03/09/2019 06:02 PM      Chemistries   Lab Results   Component Value Date/Time    NA 138 03/12/2019 04:57 AM    K 3.5 03/12/2019 04:57 AM    BUN 17 03/12/2019 04:57 AM    CR 0.65 03/12/2019 04:57 AM    GLU 133 (H) 03/12/2019 04:57 AM     Lab Results   Component Value Date/Time    CA 8.3 (L) 03/12/2019 04:57 AM    PO4 2.8 03/12/2019 04:57 AM    ALBUMIN 2.4 (L) 03/12/2019 04:57 AM    TOTPROT 5.5 (L) 03/12/2019 04:57 AM    ALKPHOS 175 (H) 03/12/2019 04:57 AM    AST 26 03/12/2019 04:57 AM    ALT 17 03/12/2019 04:57 AM    TOTBILI 1.8 (H) 03/12/2019 04:57 AM    GFR >60 03/12/2019 04:57 AM    GFRAA >60 03/12/2019 04:57 AM          I spent 65 minutes in patient care today, in at 0925 , out at 1030. >50% of my time was spent in counseling and coordination of care. Counseled patient and his family on disease processes and trajectory, symptoms, and goals of care including hospice.

## 2019-03-12 NOTE — Progress Notes
Shift skin assessment completed with: Jody, RN    Pressure injury presentYes    1. Head/Face/Neck: No  2. Trunk/Back: No  3. Upper Extremities: No   4. Lower Extremities: No  5. Pelvic/Coccyx: Yes, already present on admission and currently being documenting on.  6. Assessed for device associated injury? No      See Doc Flowsheet for additional wound details.

## 2019-03-12 NOTE — Progress Notes
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 Patient admitted to medicine for hepatic encephalopathy, progression/acute stroke.   ???  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 LIRADS 5. Cirrhosis with portal hypertension, including mild splenomegaly,  portosystemic varices, and small volume ascites. Mild peritoneal thickening and enhancement, which may reflect  infectious peritonitis. Metastatic disease could appear similar. Prominent periportal, portacaval, and epiphrenic lymph nodes, which are indeterminate and may be reactive or metastatic. Cholelithiasis.   - Tbili 1.5, albumin 2.5, Alk phos 200, AST/ALT WNL, INR 1.2         - Afebrile, no leukocytosis  - CXR showed improvement in patchy bibasilar opacities, now minimal, likely atelectasis. No new acute cardiopulmonary findings.    - UDS negative.  - UA w/o evidence of infection but with hematuria  - Paracentesis with no evidence of SBP  - Abd U/S w/ dopplers showed patent hepatic vessels with normal direction of blood flow. 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 There are 2 smaller observations elsewhere in the liver which are subthreshold for imaging characterization, possibly small regenerative nodules. These can be reevaluated on surveillance CT/MR studies for known hepatoma. Portal hypertension, with mild splenomegaly and small volume ascites.???Cholelithiasis and gallbladder sludge. Circumferential bladder wall thickening, which is nonspecific though often related to chronic bladder outlet obstruction or cystitis.   - PVR bladder scan  - At this point of time suspected patient's mental status changes/lethargy is related to stroke + hepatic encephalopathy.  - Hepatology following. Note treating his hepatocellular carcinoma would not affect his overall short-term prognosis given his recurrent strokes  - Hepatology recommends Lasix and Aldactone initiation but will hold for now pending GOC and very poor PO intake  - Blood cultures from 8/27 negative x2 days, will continue to monitor  - Continue Lactulose PO and enema q4h prn, titrate 3-4 bowel movements per day  - Cont to hold Baclofen  - MS improved with lactulose enema, will cont oral lactulose    Recurrent strokes w/ progression of now moderate acute-early subacute right superior cerebellar-brachium pontis infarcts (right SCA territory) on 03/09/2019 MRI  Dysarthria, gait imbalance, weakness  - MRI Head on 8/26 showed progression of now moderate acute-early subacute right superior cerebellar-brachium pontis infarcts (right SCA territory). New tiny acute or early subacute right tectal-superior cerebellar peduncle infarct. No hemorrhagic conversion or significant associated mass effect. Expected evolution of late subacute-chronic left lateral pontine infarct with wallerian degeneration in the left brachium pontis. Persistent mild generalized cerebral volume loss and marked nonspecific supratentorial white matter disease, likely due to chronic small vessel ischemic changes.   -  CT Head 8/26 showed redemonstration of recent right superior cerebellar artery territory infarct. The new small area of infarct (on same-day MRI) in the right superior cerebellar peduncle and tectum is of similar density to the adjacent SCA territory infarct and is also likely subacute. Redemonstration of subacute to chronic left pontine infarct with evolving wallerian degeneration of the left brachium pontis. Moderate to marked nonspecific white matter disease, likely due to chronic microvascular ischemia. No acute intracranial hemorrhage.   - Patient sent to ED from [facility] after findings of new stroke on imaging  - Stroke activated in the ED but not tPA candidate  - Neurology following  - Allow for permissive hypertension in first 48-72 hours, SBP goal 160 - 220 mmHg and DBP 70-110.  - Telemetry to monitor for arrhythmia   - Continue DAPT with ASA and Plavix.  - PT/OT/SLP consulted  ???  IDDM  - Continue PTA Lantus and continue LCDF    Orthostatic hypotension  - Continue PTA Midodrine  ???  CAD s/p PCI in 1997  HLD  - Denies chest pain, shortness of breath. Denies stress test since 1997.  - Continue DAPT for CVA as noted above  - Continue statin  ???  Depression  - Continue PTA Celexa    Inadequate oral intake  - Dietitian consulted    Bicytopenia (anemia and thrombocytopenia)  - Stable, continue to monitor    FEN: Regular diet  Ppx: Lovenox  DNAR-FI. Dr Myrtis Ser had discussion with wife and pt's children they were agreeable to palliative consult. Pt much improved mentally today, he states he would not want to be on ventilator, he is uncertain if he would want CPR. Pt wife in room, she is in agreement with no intubation, but would like to talk with palliative before changing code status further  Dispo: Continue inpatient admission  ________________________________________________________________________    Subjective  No events reported overnight. The patient is oriented to person and knew he was at [facility]. Pt awake and interactive, pt denies pain nausea or dyspnea. Pt wife in room, updated on plan of care.     Medications  Scheduled Meds:aspirin EC tablet 81 mg, 81 mg, Oral, QAM8  atorvastatin (LIPITOR) tablet 80 mg, 80 mg, Oral, QHS  citalopram (CeleXA) tablet 20 mg, 20 mg, Oral, QAM8  clopiDOGrel (PLAVIX) tablet 75 mg, 75 mg, Oral, QDAY  dextran 70/hypromellose (GENTEAL TEARS) ophthalmic solution 1 drop, 1 drop, Both Eyes, TID  enoxaparin (LOVENOX) syringe 40 mg, 40 mg, Subcutaneous, QDAY(21)  insulin aspart U-100 (NOVOLOG FLEXPEN) injection PEN 0-6 Units, 0-6 Units, Subcutaneous, ACHS (22)  insulin glargine (LANTUS SOLOSTAR) injection PEN 5 Units, 5 Units, Subcutaneous, QHS  lactulose oral solution 20 g, 30 mL, Oral, Q4H    Or  lactulose(#) 200 g/300 mL rectal enema 200 g, 200 g, Rectal, Q4H  latanoprost (XALATAN) 0.005 % ophthalmic solution 1 drop, 1 drop, Both Eyes, QHS  melatonin tablet 3 mg, 3 mg, Oral, QHS  midodrine (PROAMATINE) tablet 5 mg, 5 mg, Oral, TID  pantoprazole DR (PROTONIX) tablet 20 mg, 20 mg, Oral, QDAY    Continuous Infusions:  PRN and Respiratory Meds:acetaminophen Q6H PRN, bisacodyL BID PRN, prochlorperazine Q6H PRN **OR** prochlorperazine Q6H PRN      Review of Systems:  A 14 point review of systems was negative except as documented in the above subjective section of note.       Objective:  Vital Signs: Last Filed                 Vital Signs: 24 Hour Range   BP: 122/71 (08/29 0447)  Temp: 36.7 ???C (98.1 ???F) (08/29 0447)  Pulse: 88 (08/29 0447)  Respirations: 14 PER MINUTE (08/29 0447)  SpO2: 95 % (08/29 0447) BP: (99-135)/(64-80)   Temp:  [36.5 ???C (97.7 ???F)-37.1 ???C (98.8 ???F)]   Pulse:  [88-104]   Respirations:  [14 PER MINUTE-17 PER MINUTE]   SpO2:  [92 %-98 %]      Vitals:    03/09/19 1749   Weight: 70.3 kg (155 lb)       Intake/Output Summary:  (Last 24 hours)    Intake/Output Summary (Last 24 hours) at 03/12/2019 0905  Last data filed at 03/12/2019 0300  Gross per 24 hour   Intake 640 ml   Output 50 ml   Net 590 ml Stool Occurrence: 1    Physical Exam  General:  Awake, alert, cooperative, no distress, appears stated age  Lungs:  Clear to auscultation bilaterally  Heart:   Regular rate and rhythm  Abdomen:  Soft, non-tender. Bowel sounds normal.   Extremities: Extremities normal, atraumatic, no cyanosis or edema  Skin: Skin color, texture, turgor normal. No rashes or lesions  Neurologic:  Lethargic, oriented to person and place. 1/5 strength b/l LEs, 3/5 strength b/l UEs, mild asterixis, slurred speech, R facial droop      Lab Review  24-hour labs:    Results for orders placed or performed during the hospital encounter of 03/09/19 (from the past 24 hour(s))   POC GLUCOSE    Collection Time: 03/11/19 12:13 PM   Result Value Ref Range    Glucose, POC 190 (H) 70 - 100 MG/DL   POC GLUCOSE    Collection Time: 03/11/19  5:44 PM   Result Value Ref Range    Glucose, POC 158 (H) 70 - 100 MG/DL   POC GLUCOSE    Collection Time: 03/11/19  8:36 PM   Result Value Ref Range    Glucose, POC 149 (H) 70 - 100 MG/DL   POC GLUCOSE    Collection Time: 03/11/19  9:13 PM   Result Value Ref Range    Glucose, POC 179 (H) 70 - 100 MG/DL   CBC    Collection Time: 03/12/19  4:57 AM   Result Value Ref Range    White Blood Cells 6.9 4.5 - 11.0 K/UL    RBC 3.84 (L) 4.4 - 5.5 M/UL    Hemoglobin 11.7 (L) 13.5 - 16.5 GM/DL    Hematocrit 16.1 (L) 40 - 50 %    MCV 89.3 80 - 100 FL    MCH 30.5 26 - 34 PG    MCHC 34.2 32.0 - 36.0 G/DL    RDW 09.6 11 - 15 %    Platelet Count 141 (L) 150 - 400 K/UL    MPV 7.4 7 - 11 FL   COMPREHENSIVE METABOLIC PANEL    Collection Time: 03/12/19  4:57 AM   Result Value Ref Range    Sodium 138 137 - 147 MMOL/L    Potassium 3.5 3.5 - 5.1 MMOL/L    Chloride 103 98 - 110 MMOL/L    Glucose 133 (H) 70 - 100 MG/DL    Blood Urea Nitrogen 17 7 - 25 MG/DL    Creatinine 0.45 0.4 - 1.24 MG/DL    Calcium 8.3 (L) 8.5 - 10.6 MG/DL  Total Protein 5.5 (L) 6.0 - 8.0 G/DL    Total Bilirubin 1.8 (H) 0.3 - 1.2 MG/DL    Albumin 2.4 (L) 3.5 - 5.0 G/DL Alk Phosphatase 161 (H) 25 - 110 U/L    AST (SGOT) 26 7 - 40 U/L    CO2 29 21 - 30 MMOL/L    ALT (SGPT) 17 7 - 56 U/L    Anion Gap 6 3 - 12    eGFR Non African American >60 >60 mL/min    eGFR African American >60 >60 mL/min   MAGNESIUM    Collection Time: 03/12/19  4:57 AM   Result Value Ref Range    Magnesium 1.6 1.6 - 2.6 mg/dL   PHOSPHORUS    Collection Time: 03/12/19  4:57 AM   Result Value Ref Range    Phosphorus 2.8 2.0 - 4.5 MG/DL   PROTIME INR (PT)    Collection Time: 03/12/19  4:57 AM   Result Value Ref Range    INR 1.2 0.8 - 1.2       Point of Care Testing  (Last 24 hours)  Glucose: (!) 133 (03/12/19 0457)  POC Glucose (Download): (!) 179 (03/11/19 2113)    Radiology and other Diagnostics Review:    Pertinent radiology reviewed.    Synthia Innocent, MD   Pager [phone]

## 2019-03-12 NOTE — Progress Notes
I have reviewed the notes, assessment, and/or procedures performed by Cheri Fowler, RN and concur with her/his documentation unless otherwise noted.

## 2019-03-12 NOTE — Progress Notes
I have reviewed the notes, assessment, and/or procedures performed by Danielle Swearengin, RN and concur with her/his documentation unless otherwise noted.

## 2019-03-13 LAB — COMPREHENSIVE METABOLIC PANEL: Lab: 137 MMOL/L — ABNORMAL HIGH (ref 137–147)

## 2019-03-13 LAB — POC GLUCOSE
Lab: 143 mg/dL — ABNORMAL HIGH (ref 70–100)
Lab: 106 mg/dL — ABNORMAL HIGH (ref 70–100)
Lab: 191 mg/dL — ABNORMAL HIGH (ref 70–100)
Lab: 207 mg/dL — ABNORMAL HIGH (ref 70–100)

## 2019-03-13 LAB — MAGNESIUM: Lab: 1.5 mg/dL — ABNORMAL LOW (ref >60)

## 2019-03-13 LAB — PHOSPHORUS: Lab: 2.8 mg/dL — ABNORMAL LOW (ref 2.0–4.5)

## 2019-03-13 LAB — CBC: Lab: 6.7 K/UL — ABNORMAL LOW (ref 4.5–11.0)

## 2019-03-13 LAB — PROTIME INR (PT): Lab: 1.3 MMOL/L — ABNORMAL HIGH (ref >60)

## 2019-03-13 MED ORDER — POTASSIUM CHLORIDE 20 MEQ PO TBTQ
40 meq | ORAL | 0 refills | Status: CP
Start: 2019-03-13 — End: ?
  Administered 2019-03-13 (×2): 40 meq via ORAL

## 2019-03-13 MED ORDER — SPIRONOLACTONE 100 MG PO TAB
100 mg | Freq: Every day | ORAL | 0 refills | Status: DC
Start: 2019-03-13 — End: 2019-03-16
  Administered 2019-03-13 – 2019-03-16 (×4): 100 mg via ORAL

## 2019-03-13 MED ORDER — MAGNESIUM SULFATE IN D5W 1 GRAM/100 ML IV PGBK
1 g | INTRAVENOUS | 0 refills | Status: CP
Start: 2019-03-13 — End: ?
  Administered 2019-03-13 (×2): 1 g via INTRAVENOUS

## 2019-03-13 MED ORDER — FUROSEMIDE 40 MG PO TAB
40 mg | Freq: Every day | ORAL | 0 refills | Status: DC
Start: 2019-03-13 — End: 2019-03-16
  Administered 2019-03-13 – 2019-03-16 (×4): 40 mg via ORAL

## 2019-03-13 NOTE — Progress Notes
Shift skin assessment note:      Skin assessment completed with: Bonnee Quin, RN      1. Head/Face/Neck: No  2. Trunk/Back: No  3. Upper Extremities: No  4. Lower Extremities: No  5. Pelvic/Coccyx: Yes - present on admission; actively being charted on.   6. Assessed for device associated injury? Yes

## 2019-03-13 NOTE — Progress Notes
I have reviewed the notes, assessment, and/or procedures performed by Danielle Swearengin, RN and concur with her/his documentation unless otherwise noted.

## 2019-03-13 NOTE — Progress Notes
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 Patient admitted to medicine for hepatic encephalopathy, progression/acute stroke.   ???  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 LIRADS 5. Cirrhosis with portal hypertension, including mild splenomegaly,  portosystemic varices, and small volume ascites. Mild peritoneal thickening and enhancement, which may reflect  infectious peritonitis. Metastatic disease could appear similar. Prominent periportal, portacaval, and epiphrenic lymph nodes, which are indeterminate and may be reactive or metastatic. Cholelithiasis.   - Tbili 1.5, albumin 2.5, Alk phos 200, AST/ALT WNL, INR 1.2         - Afebrile, no leukocytosis  - CXR showed improvement in patchy bibasilar opacities, now minimal, likely atelectasis. No new acute cardiopulmonary findings.    - UDS negative. UA w/o evidence of infection but with hematuria. Paracentesis with no evidence of SBP  - Abd U/S w/ dopplers showed patent hepatic vessels with normal direction of blood flow. 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 There are 2 smaller observations elsewhere in the liver which are subthreshold for imaging characterization, possibly small regenerative nodules. These can be reevaluated on surveillance CT/MR studies for known hepatoma. Portal hypertension, with mild splenomegaly and small volume ascites.???Cholelithiasis and gallbladder sludge. Circumferential bladder wall thickening, which is nonspecific though often related to chronic bladder outlet obstruction or cystitis.   - PVR bladder scan  - Hepatology following. Note treating his hepatocellular carcinoma would not affect his overall short-term prognosis given his recurrent strokes  - Hepatology recommends Lasix and Aldactone initiation, will restart today   - Blood and ascitic fluid cultures from 8/27 negative x 3 days, will continue to monitor  - Continue Lactulose PO and enema q4h prn, titrate 3-4 bowel movements per day  - Cont to hold Baclofen  - MS improved with lactulose enema, will cont oral lactulose  -pt and family now considering discharge to home with hospice vs palliative care. Family meeting to be arranged for tomorrow. 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 New tiny acute or early subacute right tectal-superior cerebellar peduncle infarct. No hemorrhagic conversion or significant associated mass effect. Expected evolution of late subacute-chronic left lateral pontine infarct with wallerian degeneration in the left brachium pontis. Persistent mild generalized cerebral volume loss and marked nonspecific supratentorial white matter disease, likely due to chronic small vessel  ischemic changes.   - CT Head 8/26 showed redemonstration of recent right superior cerebellar artery territory infarct. The new small area of infarct (on same-day MRI) in the right superior cerebellar peduncle and tectum is of similar density to the adjacent SCA territory infarct and is also likely subacute. Redemonstration of subacute to chronic left pontine infarct with evolving wallerian degeneration of the left brachium pontis. Moderate to marked nonspecific white matter disease, likely due to chronic microvascular ischemia. No acute intracranial hemorrhage.   - Patient sent to ED from [facility] after findings of new stroke on imaging  - Stroke activated in the ED but not tPA candidate  - Neurology following  - Allow for permissive hypertension in first 48-72 hours, SBP goal 160 - 220 mmHg and DBP 70-110.  - Telemetry to monitor for arrhythmia   - Continue DAPT with ASA and Plavix.  - PT/OT/SLP consulted  ???  IDDM  - Continue PTA Lantus and continue LCDF    Orthostatic hypotension  - Continue PTA Midodrine  ???  CAD s/p PCI in 1997  HLD  - Denies chest pain, shortness of breath. Denies stress test since 1997.  - Continue DAPT for CVA as noted above  - Continue statin  ???  Depression  - Continue PTA Celexa    Inadequate oral intake  - Dietitian consulted    Bicytopenia (anemia and thrombocytopenia)  - Stable, continue to monitor    FEN: Regular diet, low mag and K replaced  Ppx: Lovenox  DNAR-FI. Dr Myrtis Ser had discussion with wife and pt's children they were agreeable to palliative consult. Pt much improved mentally today, he states he would not want to be on ventilator, he is uncertain if he would want CPR. Pt wife in room, she is in agreement with no intubation, but would like to talk with palliative before changing code status further  Dispo: Continue inpatient admission  ________________________________________________________________________    Subjective  No events reported overnight. The patient is oriented to person and knew he was at [facility]. Pt awake and interactive, pt denies pain nausea or dyspnea. Pt wife in room, updated on plan of care.     Medications  Scheduled Meds:aspirin EC tablet 81 mg, 81 mg, Oral, QAM8  atorvastatin (LIPITOR) tablet 80 mg, 80 mg, Oral, QHS  citalopram (CeleXA) tablet 20 mg, 20 mg, Oral, QAM8  clopiDOGrel (PLAVIX) tablet 75 mg, 75 mg, Oral, QDAY  dextran 70/hypromellose (GENTEAL TEARS) ophthalmic solution 1 drop, 1 drop, Both Eyes, TID  enoxaparin (LOVENOX) syringe 40 mg, 40 mg, Subcutaneous, QDAY(21)  insulin aspart U-100 (NOVOLOG FLEXPEN) injection PEN 0-6 Units, 0-6 Units, Subcutaneous, ACHS (22)  insulin glargine (LANTUS SOLOSTAR) injection PEN 5 Units, 5 Units, Subcutaneous, QHS  lactulose oral solution 20 g, 30 mL, Oral, Q4H    Or  lactulose(#) 200 g/300 mL rectal enema 200 g, 200 g, Rectal, Q4H  latanoprost (XALATAN) 0.005 % ophthalmic solution 1 drop, 1 drop, Both Eyes, QHS  melatonin tablet 3 mg, 3 mg, Oral, QHS  midodrine (PROAMATINE) tablet 5 mg, 5 mg, Oral, TID  pantoprazole DR (PROTONIX) tablet 20 mg, 20 mg, Oral, QDAY    Continuous Infusions:  PRN and Respiratory Meds:acetaminophen Q6H PRN, bisacodyL BID PRN, prochlorperazine Q6H PRN **OR** prochlorperazine Q6H PRN      Review of Systems:  A 14 point review of systems was negative except as documented in the above subjective section of note.       Objective:  Vital Signs: Last Filed                 Vital Signs: 24 Hour Range   BP: 114/61 (08/30 0442)  Temp: 36.4 ???C (97.5 ???F) (08/30 0442)  Pulse: 89 (08/30 0442)  Respirations: 16 PER MINUTE (08/30 0442)  SpO2: 95 % (08/30 0442) BP: (107-132)/(61-72)   Temp:  [36.3 ???C (97.4 ???F)-36.9 ???C (98.4 ???F)]   Pulse:  [81-98]   Respirations:  [15 PER MINUTE-18 PER MINUTE]   SpO2:  [92 %-96 %]      Vitals:    03/09/19 1749 03/13/19 0655   Weight: 70.3 kg (155 lb) 67.9 kg (149 lb 11.1 oz)       Intake/Output Summary:  (Last 24 hours)    Intake/Output Summary (Last 24 hours) at 03/13/2019 0840  Last data filed at 03/13/2019 0600  Gross per 24 hour   Intake 0 ml Output 0 ml   Net 0 ml      Stool Occurrence: 1    Physical Exam  General:  Awake, alert, cooperative, no distress, appears stated age  Lungs:  Clear to auscultation bilaterally  Heart:   Regular rate and rhythm  Abdomen:  Soft, non-tender. Bowel sounds normal.   Extremities: Extremities normal, atraumatic, no cyanosis or edema  Skin: Skin color, texture, turgor normal. No rashes or lesions  Neurologic:  Lethargic, oriented to person and place. 1/5 strength b/l LEs, 3/5 strength b/l UEs, mild asterixis, slurred speech, R facial droop      Lab Review  24-hour labs:    Results for orders placed or performed during the hospital encounter of 03/09/19 (from the past 24 hour(s))   POC GLUCOSE    Collection Time: 03/12/19  9:09 AM   Result Value Ref Range    Glucose, POC 143 (H) 70 - 100 MG/DL   POC GLUCOSE    Collection Time: 03/12/19 11:50 AM   Result Value Ref Range    Glucose, POC 181 (H) 70 - 100 MG/DL   POC GLUCOSE    Collection Time: 03/12/19  5:50 PM   Result Value Ref Range    Glucose, POC 172 (H) 70 - 100 MG/DL   POC GLUCOSE    Collection Time: 03/12/19  9:32 PM   Result Value Ref Range    Glucose, POC 143 (H) 70 - 100 MG/DL   PROTIME INR (PT)    Collection Time: 03/13/19  4:50 AM   Result Value Ref Range    INR 1.3 (H) 0.8 - 1.2   PHOSPHORUS    Collection Time: 03/13/19  4:50 AM   Result Value Ref Range    Phosphorus 2.8 2.0 - 4.5 MG/DL   MAGNESIUM    Collection Time: 03/13/19  4:50 AM   Result Value Ref Range    Magnesium 1.5 (L) 1.6 - 2.6 mg/dL   COMPREHENSIVE METABOLIC PANEL    Collection Time: 03/13/19  4:50 AM   Result Value Ref Range    Sodium 137 137 - 147 MMOL/L    Potassium 3.3 (L) 3.5 - 5.1 MMOL/L    Chloride 102 98 - 110 MMOL/L    Glucose 101 (H) 70 - 100 MG/DL    Blood Urea Nitrogen 16 7 - 25 MG/DL    Creatinine 1.61 0.4 - 1.24 MG/DL    Calcium 8.1 (L) 8.5 - 10.6 MG/DL    Total Protein 5.5 (L) 6.0 - 8.0 G/DL    Total Bilirubin 1.8 (H) 0.3 - 1.2 MG/DL  Albumin 2.4 (L) 3.5 - 5.0 G/DL Alk Phosphatase 295 (H) 25 - 110 U/L    AST (SGOT) 29 7 - 40 U/L    CO2 28 21 - 30 MMOL/L    ALT (SGPT) 19 7 - 56 U/L    Anion Gap 7 3 - 12    eGFR Non African American >60 >60 mL/min    eGFR African American >60 >60 mL/min   CBC    Collection Time: 03/13/19  4:50 AM   Result Value Ref Range    White Blood Cells 6.7 4.5 - 11.0 K/UL    RBC 3.79 (L) 4.4 - 5.5 M/UL    Hemoglobin 11.5 (L) 13.5 - 16.5 GM/DL    Hematocrit 28.4 (L) 40 - 50 %    MCV 88.1 80 - 100 FL    MCH 30.2 26 - 34 PG    MCHC 34.4 32.0 - 36.0 G/DL    RDW 13.2 11 - 15 %    Platelet Count 138 (L) 150 - 400 K/UL    MPV 7.4 7 - 11 FL   POC GLUCOSE    Collection Time: 03/13/19  8:07 AM   Result Value Ref Range    Glucose, POC 106 (H) 70 - 100 MG/DL       Point of Care Testing  (Last 24 hours)  Glucose: (!) 101 (03/13/19 0450)  POC Glucose (Download): (!) 106 (03/13/19 4401)    Radiology and other Diagnostics Review:    Pertinent radiology reviewed.    Synthia Innocent, MD   Pager [phone]

## 2019-03-13 NOTE — Progress Notes
RT Adult Assessment Note    NAME:Mitchell Lucero             MRN: [id number]             DOB:August 26, 1950          AGE: 68 y.o.  ADMISSION DATE: 03/09/2019             DAYS ADMITTED: LOS: 4 days    RT Treatment Plan:       Protocol Plan: Procedures  PEP Therapy: Discontinued  PAP: Discontinued  Oxygen/Humidity: Discontinued  SpO2: Discontinued    Additional Comments:  Impressions of the patient: Pt currently resting in bed on RA, non-labored; no acute respiratory related issues to note at this time; criteria no longer met   Intervention(s)/outcome(s): RT re-eval  Patient education that was completed: n/a  Recommendations to the care team: n/a    Vital Signs:  Pulse: 80  RR: 18 PER MINUTE  SpO2: 96 %  O2 Device: Standby  Liter Flow:    O2%: (RA)  Breath Sounds: Clear (Implies normal)  Respiratory Effort: Non-Labored

## 2019-03-14 LAB — POC GLUCOSE
Lab: 212 mg/dL — ABNORMAL HIGH (ref 70–100)
Lab: 157 mg/dL — ABNORMAL HIGH (ref 70–100)
Lab: 174 mg/dL — ABNORMAL HIGH (ref 70–100)
Lab: 170 mg/dL — ABNORMAL HIGH (ref 70–100)

## 2019-03-14 LAB — MAGNESIUM: Lab: 1.6 mg/dL — ABNORMAL LOW (ref 1.6–2.6)

## 2019-03-14 LAB — COMPREHENSIVE METABOLIC PANEL
Lab: 0.7 mg/dL (ref 0.4–1.24)
Lab: 1.8 mg/dL — ABNORMAL HIGH (ref 0.3–1.2)
Lab: 11 mg/dL (ref 7–25)
Lab: 136 MMOL/L — ABNORMAL LOW (ref 137–147)
Lab: 156 U/L — ABNORMAL HIGH (ref 25–110)
Lab: 19 U/L (ref 7–56)
Lab: 2.3 g/dL — ABNORMAL LOW (ref 3.5–5.0)
Lab: 29 MMOL/L (ref 21–30)
Lab: 32 U/L (ref 7–40)
Lab: 5.3 g/dL — ABNORMAL LOW (ref 6.0–8.0)
Lab: 6 (ref 3–12)
Lab: >60 mL/min (ref >60)
Lab: >60 mL/min (ref >60)

## 2019-03-14 LAB — CBC: Lab: 6.6 10*3/uL (ref 4.5–11.0)

## 2019-03-14 LAB — PHOSPHORUS: Lab: 2.7 mg/dL — ABNORMAL LOW (ref 2.0–4.5)

## 2019-03-14 LAB — PROTIME INR (PT): Lab: 1.3 mg/dL — ABNORMAL HIGH (ref 0.8–1.2)

## 2019-03-14 MED ORDER — BISACODYL 5 MG PO TBEC
5 mg | Freq: Every day | ORAL | 0 refills | Status: DC
Start: 2019-03-14 — End: 2019-03-16
  Administered 2019-03-14 – 2019-03-16 (×3): 5 mg via ORAL

## 2019-03-14 MED ORDER — MIRTAZAPINE 15 MG PO TAB
15 mg | Freq: Every evening | ORAL | 0 refills | Status: DC
Start: 2019-03-14 — End: 2019-03-16
  Administered 2019-03-15 – 2019-03-16 (×2): 15 mg via ORAL

## 2019-03-14 NOTE — Progress Notes
PHYSICAL THERAPY  PROGRESS NOTE        Name: Mitchell Lucero        MRN: [id number]          DOB: 1951/02/07          Age: 68 y.o.  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 Patient admitted to medicine for hepatic encephalopathy, progression/acute stroke.   Mental / Cognitive Status: Alert;Cooperative;Follows Commands(drowsy at first- mentation improved with activity. )  Persons Present: Spouse;RehabTechnician  Pain: Patient has no complaint of pain  Comments: Ted hose and abdominal binder donned prior to upright mobility (history of orthostatic hypotension and multiple drop falls prior to previous admission).   R LE Precautions: RLE AFO: Ankle Foot Orthosis  Ambulation Assist: Independent Mobility at Household Level with Device;Assist Needed with Mobility-Related ADL's/Ambulation  Patient Owned Equipment: Nurse, adult  Home Situation: Lives with Family(spouse and mother)  Type of Home: House(split level)  Entry Stairs: Stair Glide  In-Home Stairs: Stair Glide  Comments: spouse reports patient wants to return home instead of returning to rehab. goal is to be able to transfer. however, per spouse report, rehab was going to set patient up with lift for returning home. palliative care and family meeting later this date to evaluate further goals of care.     Bed Mobility/Transfer  Bed Mobility: Supine to Sit: Moderate Assist;Head of Bed Elevated;Assist with Trunk  Transfer Type: Sit to Stand  Transfer: Assistance Level: To/From;Bed;Moderate Assist;of 1st person;Minimal Assist;of 2nd person  Transfer: Assistive Device: Chief of Staff Assist  Transfers: Type Of Assistance: Verbal Cues;Knees(s) Blocked;For Balance;For Strength Deficit;For Safety Considerations  Other Transfer Type: Squat Pivot  Other Transfer: Assistance Level: To;Bed Side Chair;Moderate Assist;x2 People  Other Transfer: Assistive Device: Doctor, general practice  Other Transfer: Type Of Assistance: Verbal Cues;For Balance;Requires Extra Time;For Safety Considerations  End Of Activity Status: Up in Chair;Nursing Notified;Instructed Patient to Request Assist with Mobility;Instructed Patient to Use Call Light(hoyer sling and TABS alarm in place)  Comments: patient dizzy and nauseated at end of session.     Balance  Sitting Balance: Static Sitting Balance;2 UE Support;Minimal Assist;Dynamic Sitting Balance;Moderate Assist(Leans to L side; increased with fatigue)  Standing Balance: Static Standing Balance;2 UE support;Moderate Assist;of 1st Person;Minimal Assist;of 2nd Person    Gait  Gait Distance: (N/A)       Activity/Exercise  Sit Edge Of Bed: 10 minutes  Sit Edge Of Bed Assist: Minimal Assist;Moderate  Assist;Variable  Stand At Bedside : (15 seconds)  Stand At Bedside Assist: Minimal Assist;Moderate  Assist;x2 People;Variable  Weight Shift Repetitions: 5 repetitions  Weight Shift Assist: Minimal Assist    Education  Persons Educated: Patient/Family  Patient Barriers To Learning: None Noted  Teaching Methods: Verbal Instruction  Patient Response: Verbalized Understanding  Topics: Plan/Goals of PT Interventions;Mobility Progression;Importance of Increasing Activity;Pressure Relief;Positioning;Recommend Continued Therapy;Therapy Schedule Assessment/Progress  Impaired Mobility Due To: Decreased Strength;Impaired Balance;Cognitive Deficits;Deconditioning;Medical Status Limitation  Assessment/Progress: Slow Progress, Medical Status Limitations  Comments: patient able to tolerate activity. still demonstrates difficulty with upright tolerance due to dizziness. patient will need to be able to tolerate activity- 1 hour car ride home. family planning for discharging  home. meeting today to identify further goals of care.     AM-PAC 6 Clicks Basic Mobility Inpatient  Turning from your back to your side while in a flat bed without using bed rails: A lot  Moving from lying on your back to sitting on the side of a flatbed without using bedrails : A Lot  Moving to and from a bed to a chair (including a wheelchair): A Lot  Standing up from a chair using your arms (e.g. wheelchair, or bedside chair): A Lot  To walk in hospital room: Total  Climbing 3-5 steps with a railing: Total  Raw Score: 10  Standardized (T-scale) Score: 28.13  Basic Mobility CMS 0-100%: 71.92  CMS G Code Modifier for Basic Mobility: CL    Goals  Goal Formulation: With Patient  Time For Goal Achievement: 5 days, To, 7 days  Patient Will Go Supine To/From Sit: w/ Minimal Assist  Patient Will Transfer Bed/Chair: w/ Moderate Assist  Patient Will Transfer Sit to Stand: w/ Moderate Assist  Patient Will Achieve Sitting Balance: 2 UE Support, Standby Assist    Plan  Treatment Interventions: Mobility Training;Strengthening  Plan Frequency: 5-7 Days per Week  PT Plan for Next Visit: Bed mobility; sitting balance; sit to stands; standing balance    PT Discharge Recommendations  Recommendation: Inpatient setting  Recommendation for Therapy Post Discharge: Home health(if patient discharges home)  Patient Currently Requires Physical Assist With: All mobility  Patient Currently Requires Equipment: Hospital bed;Mechanical lift(if patient discharges home. )    Therapist: Joice Lofts Brizza Nathanson  Date: 03/14/2019

## 2019-03-14 NOTE — Progress Notes
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 Patient admitted to medicine for hepatic encephalopathy, progression/acute stroke.   ???  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 LIRADS 5. Cirrhosis with portal hypertension, including mild splenomegaly,  portosystemic varices, and small volume ascites. Mild peritoneal thickening and enhancement, which may reflect  infectious peritonitis. Metastatic disease could appear similar. Prominent periportal, portacaval, and epiphrenic lymph nodes, which are indeterminate and may be reactive or metastatic. Cholelithiasis.   - Tbili 1.5, albumin 2.5, Alk phos 200, AST/ALT WNL, INR 1.2         - Afebrile, no leukocytosis  - CXR showed improvement in patchy bibasilar opacities, now minimal, likely atelectasis. No new acute cardiopulmonary findings.    - UDS negative. UA w/o evidence of infection but with hematuria. Paracentesis with no evidence of SBP  - Abd U/S w/ dopplers showed patent hepatic vessels with normal direction of blood flow. 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 There are 2 smaller observations elsewhere in the liver which are subthreshold for imaging characterization, possibly small regenerative nodules. These can be reevaluated on surveillance CT/MR studies for known hepatoma. Portal hypertension, with mild splenomegaly and small volume ascites.???Cholelithiasis and gallbladder sludge. Circumferential bladder wall thickening, which is nonspecific though often related to chronic bladder outlet obstruction or cystitis.   - PVR bladder scan  - Hepatology following. Note treating his hepatocellular carcinoma would not affect his overall short-term prognosis given his recurrent strokes  - Hepatology recommends Lasix and Aldactone initiation, restarted on 8/30  - Blood and ascitic fluid cultures from 8/27 negative x 4 days, will continue to monitor  -  Baclofen held on admission  - MS improved with lactulose enema and holding baclofen  -pt and family now considering discharge to home with hospice vs palliative care. Family meeting was held today with palliative care team  - Continue Lactulose PO  titrate 3-4 bowel movements per day. Pt with no BM today, he is hesistant to increase lactulose, will add dulcolax and monitor closely    Recurrent strokes w/ progression of now moderate acute-early subacute right superior cerebellar-brachium pontis infarcts (right SCA territory) on 03/09/2019 MRI  Dysarthria, gait imbalance, weakness  - MRI Head on 8/26 showed progression of now moderate acute-early subacute right superior cerebellar-brachium pontis infarcts (right SCA territory). New tiny acute or early subacute right tectal-superior cerebellar peduncle infarct. No hemorrhagic conversion or significant associated mass effect. Expected evolution of late subacute-chronic left lateral pontine infarct with wallerian degeneration in the left brachium pontis. Persistent mild generalized cerebral volume loss and marked nonspecific supratentorial white matter disease, likely due  to chronic small vessel ischemic changes. - CT Head 8/26 showed redemonstration of recent right superior cerebellar artery territory infarct. The new small area of infarct (on same-day MRI) in the right superior cerebellar peduncle and tectum is of similar density to the adjacent SCA territory infarct and is also likely subacute. Redemonstration of subacute to chronic left pontine infarct with evolving wallerian degeneration of the left brachium pontis. Moderate to marked nonspecific white matter disease, likely due to chronic microvascular ischemia. No acute intracranial hemorrhage.   - Patient sent to ED from [facility] after findings of new stroke on imaging.  Stroke activated in the ED but not tPA candidate  - Neurology consulted  - Continue DAPT with ASA and Plavix.  - PT/OT/SLP consulted  ???  Possible UTI  -urine cultures pta with enterococcus faecalis and aerococcus urinae  -does not appear to have been treated. However pt with normal wbc and clinically improving  -will repeat ua and culture today    IDDM  - Continue PTA Lantus and continue LCDF    Orthostatic hypotension  - Continue PTA Midodrine  ???  CAD s/p PCI in 1997  HLD  - Denies chest pain, shortness of breath. Denies stress test since 1997.  - Continue DAPT for CVA as noted above  - Continue statin  ???  Depression  - Continue PTA Celexa    Inadequate oral intake  - Dietitian consulted    Bicytopenia (anemia and thrombocytopenia)  - Stable, continue to monitor    Malnutrition Details:  ICD-10 code E44: Chronic illness/Moderate non-severe malnutrition      Energy intake: < 75% of estimated energy requirement for 1 month or more, Weight loss: 20% x 1 year, Mild loss of muscle mass    Loss of Subcutaneous Fat: No      Muscle Wasting: Yes Moderate Clavicle, Interosseous, Temple  Edema: Yes Moderate (dendent )  Malnutrition Interventions: Allow liberalized diet, encouraged good PO efforts   -will add remeron and boost supplement today    FEN: Regular diet, low mag  replaced  Ppx: Lovenox DNAR-LI, palliative care team following, appreciate their assistance  Dispo: Continue inpatient admission  ________________________________________________________________________    Subjective  No events reported overnight. The patient is oriented to person and knew he was at [facility]. Pt awake and interactive, pt denies pain nausea or dyspnea. Pt wife in room, updated on plan of care.     Medications  Scheduled Meds:aspirin EC tablet 81 mg, 81 mg, Oral, QAM8  atorvastatin (LIPITOR) tablet 80 mg, 80 mg, Oral, QHS  citalopram (CeleXA) tablet 20 mg, 20 mg, Oral, QAM8  clopiDOGrel (PLAVIX) tablet 75 mg, 75 mg, Oral, QDAY  dextran 70/hypromellose (GENTEAL TEARS) ophthalmic solution 1 drop, 1 drop, Both Eyes, TID  enoxaparin (LOVENOX) syringe 40 mg, 40 mg, Subcutaneous, QDAY(21)  furosemide (LASIX) tablet 40 mg, 40 mg, Oral, QDAY  insulin aspart U-100 (NOVOLOG FLEXPEN) injection PEN 0-6 Units, 0-6 Units, Subcutaneous, ACHS (22)  insulin glargine (LANTUS SOLOSTAR) injection PEN 5 Units, 5 Units, Subcutaneous, QHS  lactulose oral solution 20 g, 30 mL, Oral, Q4H    Or  lactulose(#) 200 g/300 mL rectal enema 200 g, 200 g, Rectal, Q4H  latanoprost (XALATAN) 0.005 % ophthalmic solution 1 drop, 1 drop, Both Eyes, QHS  melatonin tablet 3 mg, 3 mg, Oral, QHS  midodrine (PROAMATINE) tablet 5 mg, 5 mg, Oral, TID  pantoprazole DR (PROTONIX) tablet 20 mg, 20 mg, Oral, QDAY  spironolactone (ALDACTONE) tablet 100 mg, 100 mg, Oral,  QDAY    Continuous Infusions:  PRN and Respiratory Meds:acetaminophen Q6H PRN, bisacodyL BID PRN, prochlorperazine Q6H PRN **OR** prochlorperazine Q6H PRN      Review of Systems:  A 14 point review of systems was negative except as documented in the above subjective section of note.       Objective:                          Vital Signs: Last Filed                 Vital Signs: 24 Hour Range   BP: 113/66 (08/31 9147)  Temp: 36.4 ???C (97.5 ???F) (08/31 8295)  Pulse: 92 (08/31 6213) Respirations: 18 PER MINUTE (08/31 0638)  SpO2: 96 % (08/31 0638) BP: (98-147)/(52-94)   Temp:  [36.3 ???C (97.3 ???F)-36.9 ???C (98.5 ???F)]   Pulse:  [80-92]   Respirations:  [16 PER MINUTE-18 PER MINUTE]   SpO2:  [96 %-99 %]      Vitals:    03/09/19 1749 03/13/19 0655 03/14/19 0732   Weight: 70.3 kg (155 lb) 67.9 kg (149 lb 11.1 oz) 67.3 kg (148 lb 5.9 oz)       Intake/Output Summary:  (Last 24 hours)    Intake/Output Summary (Last 24 hours) at 03/14/2019 0865  Last data filed at 03/14/2019 7846  Gross per 24 hour   Intake 240 ml   Output 0 ml   Net 240 ml      Stool Occurrence: 1    Physical Exam  General:  Awake, alert, cooperative, no distress, appears stated age  Lungs:  Clear to auscultation bilaterally  Heart:   Regular rate and rhythm  Abdomen:  Soft, non-tender. Bowel sounds normal.   Extremities: Extremities normal, atraumatic, no cyanosis or edema  Skin: Skin color, texture, turgor normal. No rashes or lesions  Neurologic:  Lethargic, oriented to person and place. 1/5 strength b/l LEs, 3/5 strength b/l UEs, mild asterixis, slurred speech, R facial droop      Lab Review  24-hour labs:    Results for orders placed or performed during the hospital encounter of 03/09/19 (from the past 24 hour(s))   POC GLUCOSE    Collection Time: 03/13/19 12:09 PM   Result Value Ref Range    Glucose, POC 191 (H) 70 - 100 MG/DL   POC GLUCOSE    Collection Time: 03/13/19  5:20 PM   Result Value Ref Range    Glucose, POC 207 (H) 70 - 100 MG/DL   POC GLUCOSE    Collection Time: 03/13/19  8:14 PM   Result Value Ref Range    Glucose, POC 212 (H) 70 - 100 MG/DL   CBC    Collection Time: 03/14/19  2:35 AM   Result Value Ref Range    White Blood Cells 6.6 4.5 - 11.0 K/UL    RBC 3.82 (L) 4.4 - 5.5 M/UL    Hemoglobin 11.5 (L) 13.5 - 16.5 GM/DL    Hematocrit 96.2 (L) 40 - 50 %    MCV 87.8 80 - 100 FL    MCH 30.2 26 - 34 PG    MCHC 34.4 32.0 - 36.0 G/DL    RDW 95.2 11 - 15 %    Platelet Count 154 150 - 400 K/UL    MPV 7.5 7 - 11 FL COMPREHENSIVE METABOLIC PANEL    Collection Time: 03/14/19  2:35 AM   Result Value Ref Range  Sodium 136 (L) 137 - 147 MMOL/L    Potassium 3.9 3.5 - 5.1 MMOL/L    Chloride 101 98 - 110 MMOL/L    Glucose 112 (H) 70 - 100 MG/DL    Blood Urea Nitrogen 11 7 - 25 MG/DL    Creatinine 3.22 0.4 - 1.24 MG/DL    Calcium 8.3 (L) 8.5 - 10.6 MG/DL    Total Protein 5.3 (L) 6.0 - 8.0 G/DL    Total Bilirubin 1.8 (H) 0.3 - 1.2 MG/DL    Albumin 2.3 (L) 3.5 - 5.0 G/DL    Alk Phosphatase 025 (H) 25 - 110 U/L    AST (SGOT) 32 7 - 40 U/L    CO2 29 21 - 30 MMOL/L    ALT (SGPT) 19 7 - 56 U/L    Anion Gap 6 3 - 12    eGFR Non African American >60 >60 mL/min    eGFR African American >60 >60 mL/min   MAGNESIUM    Collection Time: 03/14/19  2:35 AM   Result Value Ref Range    Magnesium 1.6 1.6 - 2.6 mg/dL   PHOSPHORUS    Collection Time: 03/14/19  2:35 AM   Result Value Ref Range    Phosphorus 2.7 2.0 - 4.5 MG/DL   PROTIME INR (PT)    Collection Time: 03/14/19  2:35 AM   Result Value Ref Range    INR 1.3 (H) 0.8 - 1.2   POC GLUCOSE    Collection Time: 03/14/19  8:28 AM   Result Value Ref Range    Glucose, POC 157 (H) 70 - 100 MG/DL       Point of Care Testing  (Last 24 hours)  Glucose: (!) 112 (03/14/19 0235)  POC Glucose (Download): (!) 157 (03/14/19 4270)    Radiology and other Diagnostics Review:    Pertinent radiology reviewed.    Synthia Innocent, MD   Pager [phone]

## 2019-03-14 NOTE — Progress Notes
Shift skin assessment completed with: Bonnee Quin, RN      1. Head/Face/Neck: No  2. Trunk/Back: No  3. Upper Extremities: No  4. Lower Extremities: No  5. Pelvic/Coccyx: Yes- present on admission.   6. Assessed for device associated injury? No    See Doc Flowsheet for additional wound details.     INTERVENTIONS:

## 2019-03-14 NOTE — Case Management (ED)
Notified by Ledell Noss (SW) that patient is likely to go home tomorrow with hospice vs. palliative care - pending discussion with family meeting today. Will remove pt from working list.     Kristeen Miss, BSN RN   [place] Admission Nurse (office: [phone] or voalte)

## 2019-03-14 NOTE — Progress Notes
PALLIATIVE CARE INPATIENT NOTE     Name: Mitchell Lucero            MRN: [id number]                DOB: 26-Nov-1950          Age: 68 y.o.  Admission Date: 03/09/2019             LOS: 5 days    ASSESSMENT     Sanav Remer Kaley is a 68 y.o. male with 68 y/o man w/ CAD s/p PCI, DM, orthostatic hypotension, multiple recurrent CVA, HCC diagnosed in 01/2019, NASH cirrhosis who was recently discharged to  Rehab from an admission to neurolgoy service for AMS, found to have hepatic encephalopathy and progession of subactue cerebellar infarcts.       Advance Care Planning:   Code Status: DNAR-Limited Intervention  Identified Health Care Decision Maker:  Wife Absalom, Aro  Spouse  [phone]   [phone]    Trinna Post  Daughter  [phone]   [phone]    Delaguila, Peter Minium    [phone]    lasater, sabrina  Daughter    [phone]      Prognosis (Estimated):  Based on current function, current medical issues, goals of care, and prognosis models, my estimated prognosis is weeks.  Potential Disposition: Considering home hospice      PLAN       #Weight loss  #Declining functional status  #Hepatic encephalopathy   #Recurrent CVAs  # Goals of care     Discussion:   See ACP note from today for full discussion.   Our team met with patient and wife (in person) and children, Margarette [place] and [place] via zoom. Family reports having good understanding of patient's current medical issues and are supportive of whatever his wishes are.  Wife asked about palliative home health resources vs hospice resources at home. We discussed that palliative home health provides services with the goal of stabilizing/improving function and leaves the door open to come back to the hospital if this is consistent with goals.  Explained that hospice provides more robust support and services in the home with goals focused on comfort, but not coming back to the hospital as his body is shutting down.  Daughter in law also joined by phone and expressed concerns regarding experiences she has had with hospice where the patient was so uncomfortable that they had to call 911 to go to the hospital to get more sx mgmt. We discussed that the hospice agency should have a better plan in place to prevent this from happening, however if there are no other options this is not unreasonable.  We encouraged the family to interview hospice agencies and ask them how they would handle such a situation.    Patient was largely silent during meeting, but was able to say repeatedly that he just wants to go home.  We encouraged family to discuss options and to let us know which resource they feel more comfortable with.     Later in the day received word that family has decided to use hospice care. We will follow along to help with discharge needs.     Would continue current medications for now.  Discussed with family that it would be reasonable to DC statin on discharge.  Would continue lactulose enemas as long as patient perceives benefit from this.  Will discuss other medications with hospice as ASA/plavix as well as midodrine could  contribute to comfort/quality time.     Will also need to explore options for paracentesis while on hospice.         SUBJECTIVE     CC/Reason for Visit: Condition check, plan of care.       Subjective   Patient reports doing okay.  Very lethargic and not very interactive today.     ROS:   +AMS  +weakness        OBJECTIVE     Blood pressure 125/76, pulse 85, temperature 37.1 ???C (98.7 ???F), height 160 cm (63), weight 67.3 kg (148 lb 5.9 oz), SpO2 95 %.   Physical Exam  Constitutional: Appears stated age, resting in bed.  Alert but quite lethargic, in no distress  HEENT: NC/AT, EOMI  Lungs:  Even, non-labored  Neuro: Face symmetric, no tremor or myoclonus  Skin: Warm/dry  Psych: Calm  Ext: No edema noted      Current PPS%:   30    Lab Results:  CBC   Lab Results Component Value Date/Time    WBC 6.6 03/14/2019 02:35 AM    HGB 11.5 (L) 03/14/2019 02:35 AM    PLTCT 154 03/14/2019 02:35 AM     Lab Results   Component Value Date/Time    NEUT 50 03/09/2019 06:02 PM    ANC 3.40 03/09/2019 06:02 PM      Chemistries   Lab Results   Component Value Date/Time    NA 136 (L) 03/14/2019 02:35 AM    K 3.9 03/14/2019 02:35 AM    BUN 11 03/14/2019 02:35 AM    CR 0.72 03/14/2019 02:35 AM    GLU 112 (H) 03/14/2019 02:35 AM     Lab Results   Component Value Date/Time    CA 8.3 (L) 03/14/2019 02:35 AM    PO4 2.7 03/14/2019 02:35 AM    ALBUMIN 2.3 (L) 03/14/2019 02:35 AM    TOTPROT 5.3 (L) 03/14/2019 02:35 AM    ALKPHOS 156 (H) 03/14/2019 02:35 AM    AST 32 03/14/2019 02:35 AM    ALT 19 03/14/2019 02:35 AM    TOTBILI 1.8 (H) 03/14/2019 02:35 AM    GFR >60 03/14/2019 02:35 AM    GFRAA >60 03/14/2019 02:35 AM        Visit time: 1610-9604.  >50% of time spent in counseling and coordination of care.     Discussed with Dr. Yolanda Bonine.     Katharina Caper, MD  Palliative Care  [phone]  Team pgr [phone]  Also on Voalte and Cureatr.

## 2019-03-14 NOTE — Progress Notes
OCCUPATIONAL THERAPY  PROGRESS NOTE      Name: Mitchell Lucero        MRN: [id number]          DOB: [birth year]          Age: 68 y.o.  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 Patient admitted to medicine for hepatic encephalopathy, progression/acute stroke.   Precautions: Falls;Seizure Precautions(abdominal binder, compression stockings)  Pain / Complaints: Patient agrees to participate in therapy;Patient demonstrates no signs of pain    Objective  Psychosocial Status: Willing and Cooperative to Participate  Persons Present: RehabTechnician    Home Living  Type of Home: House(split level)  Home Layout: Bed/Bath IT sales professional / Tub: Pension scheme manager: Standard  Bathroom Equipment: Engineer, materials in Air traffic controller;Shower Chair  Home Equipment: Walker  Comment: Prior to hospital admission, pt's wife would set up him in the morning & then leave for work. Patient would go down to the basement level, where he spent most of the day with his mother. Pt's mother has mobility challenges but is mentally intact.     Prior Function  Level Of Independence: Independent with ADLs and functional transfers  Lives With: Spouse  Receives Help From: Family  Vocational: On Disability Other Function Comments: Spouse works. After patient's stroke, patient's spouse assisted patient to same level as his mother before she left for work. Patient's spouse is hoping to be able to continue to work. Patient/spouse indicate fall history - spouse reports 18+ falls. (L pontine stroke on 12/18/2018, cerebellar stroke on 01/2019) HCC diagnosed 01/2019- recently discharged from the Neurology service and admitted to [facility]; however rapid response after MRI on 03/09/2019 for worsening mental status and sent to [facility] ED for further evaluation. Patient admitted to medicine for hepatic encephalopathy, progression/acute stroke.     Vision  Current Vision: Wears Glasses All of the Time    ADL's  Where Assessed: Chair;Supine, Bed  LE Dressing Assist: Total Assist  LE Dressing Deficits: Don/Doff R Shoe;Don/Doff L Shoe;Tie Shoes(R AFO)    ADL Mobility  Bed Mobility: Sit to Supine: Maximum assist;x2 people  Transfer Type: Squat pivot  Transfer: Assistance Level: Maximum assist;x2 people  Transfer: Assistive Device: None  Transfer: Type of Assistance: For safety considerations;For balance;For strength deficit  End of Activity Status: In bed;Instructed patient to request assist with mobility;Instructed patient to use call light;Nursing notified(Bed alarm activated)  Sitting Balance: Static sitting balance;Maximum assist    Activity Tolerance  Endurance: 1/5 Tolerates <10 Minutes Exercises, No Significant Change in Vital Signs  Comment: Pt very lethargic this during therapy session, thus requiring increased assist for transfers and bed mobility    Cognition  Overall Cognitive Status: Impaired  Comprehension: (Lethargic; follows commands)  Expression: Increased Time  for Expression;Mumbling/Slurred  Social Interaction: Increased Time to Adjust  Attention: Somnolent    UE AROM  Overall BUE AROM WNL: (Approx 90 degrees flexion/abduct at b/l GH joint)  Coordination: Moderate Delay  Grasp: R Weakened;L Weakened(R weaker than L ) Education  Persons Educated: Patient  Barriers To Learning: Decreased Alertness  Teaching Methods: Verbal Instruction  Patient Response: Verbalized and Demo Understanding  Topics: Role of OT, Goals for Therapy  Goal Formulation: With Family    Assessment  Assessment: Decreased ADL Status;Decreased Safe/Judg during ADL;Decreased Cognition;Decreased Endurance;Decreased Self-Care Trans;Decreased High-Level ADLs  Prognosis: Guarded;w/Cont OT s/p Acute Discharge  Goal Formulation: Pt/family  Comments: Pt with complicated medical course 2/2 multiple CVAs in June, July and August as well as [facility] diagnosis in July and AMS 2/2 CVA/heptic encephalopathy. Pt was ambulatory and transferring with as little as MinA ~2 weeks ago and is now requiring Mod-MaxAx2 for standing. Pt and pt's wife have expressed interest in home hospice. Wife reports she feels very torn with how to move forward. She is encouraged by his improvement in cognition, but neither of them wish to keep cycling back and forth from rehab/hospital until he passes. Current goal discussed of getting patient to functional level of SPT with Ax1 for home d/c. They also have a goal of going to their granddaughter's wedding with pt in wheelchair 9/18. Will continue to assess realistic goals/POC for patient to support he and his wife's wishes.     Plan  OT Frequency: 5x/week  OT Plan for Next Visit: Monitor vitals (don TED/abd binder prior to mobility). Sit>stand with Ax2 progressing as appropriate, ADLs at EOB    ADL Goals  Patient Will Perform Grooming: w/ Stand By Assist  Patient Will Perform Toileting: w/ Moderate Assist    Functional Transfer Goals  Pt Will Perform All Functional Transfers: Moderate Assist  Pt Will Transfer To Bedside Commode: w/ Moderate Assist    OT Discharge Recommendations  Recommendation: Inpatient setting  Patient Currently Requires Physical Assist With: All mobility;All personal care ADLs;All home functioning ADLs Patient Currently Requires Equipment: Commode: drop arm;Hospital bed;Mechanical lift;Wheelchair: cushion(Pt will need the following equpiment if home)     Patient requires a hospital bed at discharge.  Patient requires positioning of the body in ways not feasible with ordinary beds, and the patient requires frequent repositioning of the body or has an immediate need for change in body position.     A. The patient has a mobility limitation that significantly impairs ability to participate in one or more mobility-related activity of daily living (MRADL???s) such as toileting, feeding, dressing, grooming and bathing in customary locations in the home  B. The patient???s mobility limitation cannot be sufficiently resolved by the use of an appropriately fitting cane or walker   C. Use of a manual wheelchair will significantly improve the patient???s ability to participate in MRADL???s and the patient will use it on the regular basis in the home  D. The patient has not expressed an unwillingness to use the manual wheelchair that is provided in the home.  E. The patient has a caregiver who is available, willing and able to provide assistance with the wheelchair.  F. The patient would be confined to a chair or bed without use of a wheelchair.    The patient???s home provides adequate access between rooms, maneuvering space, and surfaces for use of the manual wheelchair that is provided.      Wheelchair width recommendation: 20 inches with a standard cushion  Therapist: Michaelle Copas, OTR/L [phone]  Date: 03/14/2019

## 2019-03-15 LAB — PHOSPHORUS: Lab: 3.9 mg/dL — ABNORMAL HIGH (ref >60)

## 2019-03-15 LAB — POC GLUCOSE
Lab: 142 mg/dL — ABNORMAL HIGH (ref 70–100)
Lab: 122 mg/dL — ABNORMAL HIGH (ref 70–100)
Lab: 184 mg/dL — ABNORMAL HIGH (ref 70–100)
Lab: 170 mg/dL — ABNORMAL HIGH (ref 70–100)

## 2019-03-15 LAB — PROTIME INR (PT): Lab: 1.2 mg/dL — ABNORMAL HIGH (ref >60)

## 2019-03-15 LAB — CBC: Lab: 6.9 K/UL — ABNORMAL HIGH (ref 4.5–11.0)

## 2019-03-15 LAB — COMPREHENSIVE METABOLIC PANEL: Lab: 138 MMOL/L — ABNORMAL LOW (ref >60)

## 2019-03-15 LAB — MAGNESIUM: Lab: 1.6 mg/dL — ABNORMAL HIGH (ref 1.6–2.6)

## 2019-03-15 MED ORDER — POLYETHYLENE GLYCOL 3350 17 GRAM PO PWPK
1 | ORAL | 0 refills | Status: DC
Start: 2019-03-15 — End: 2019-03-16
  Administered 2019-03-15 – 2019-03-16 (×2): 17 g via ORAL

## 2019-03-15 NOTE — Case Management (ED)
CMA Note:       Delivered PCS form to pt.    Chriss Czar  Case Management Assistant

## 2019-03-15 NOTE — Progress Notes
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 Patient admitted to medicine for hepatic encephalopathy, progression/acute stroke.   ???  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 LIRADS 5. Cirrhosis with portal hypertension, including mild splenomegaly,  portosystemic varices, and small volume ascites. Mild peritoneal thickening and enhancement, which may reflect  infectious peritonitis. Metastatic disease could appear similar. Prominent periportal, portacaval, and epiphrenic lymph nodes, which are indeterminate and may be reactive or metastatic. Cholelithiasis.   - Tbili 1.5, albumin 2.5, Alk phos 200, AST/ALT WNL, INR 1.2         - Afebrile, no leukocytosis  - CXR showed improvement in patchy bibasilar opacities, now minimal, likely atelectasis. No new acute cardiopulmonary findings.    - UDS negative. UA w/o evidence of infection but with hematuria. Paracentesis with no evidence of SBP  - Abd U/S w/ dopplers showed patent hepatic vessels with normal direction of blood flow. 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 There are 2 smaller observations elsewhere in the liver which are subthreshold for imaging characterization, possibly small regenerative nodules. These can be reevaluated on surveillance CT/MR studies for known hepatoma. Portal hypertension, with mild splenomegaly and small volume ascites.???Cholelithiasis and gallbladder sludge. Circumferential bladder wall thickening, which is nonspecific though often related to chronic bladder outlet obstruction or cystitis.   - PVR bladder scan  - Hepatology following. Note treating his hepatocellular carcinoma would not affect his overall short-term prognosis given his recurrent strokes  - Hepatology recommends Lasix and Aldactone initiation, restarted on 8/30  - Blood and ascitic fluid cultures from 8/27 were negative   -  Baclofen held on admission  - MS improved with lactulose enema and holding baclofen  -pt and family now considering discharge to home with hospice vs palliative care. Family meeting was held today with palliative care team  - Continue Lactulose PO  titrate 3-4 bowel movements per day. Pt with increased somnolence, encouraged use of lactulose enema. Miralax ordered, however pt is not taking a good volume of liquid unclear if he will tolerate taking.     Recurrent strokes w/ progression of now moderate acute-early subacute right superior cerebellar-brachium pontis infarcts (right SCA territory) on 03/09/2019 MRI  Dysarthria, gait imbalance, weakness  - MRI Head on 8/26 showed progression of now moderate acute-early subacute right superior cerebellar-brachium pontis infarcts (right SCA territory). New tiny acute or early subacute right tectal-superior cerebellar peduncle infarct. No hemorrhagic conversion or significant associated mass effect. Expected evolution of late subacute-chronic left lateral pontine infarct with wallerian degeneration in the left brachium pontis. Persistent mild generalized cerebral volume loss and marked nonspecific  supratentorial white matter disease, likely due to chronic small vessel ischemic changes.   - CT Head 8/26 showed redemonstration of recent right superior cerebellar artery territory infarct. The new small area of infarct (on same-day MRI) in the right superior cerebellar peduncle and tectum is of similar density to the adjacent SCA territory infarct and is also likely subacute. Redemonstration of subacute to chronic left pontine infarct with evolving wallerian degeneration of the left brachium pontis. Moderate to marked nonspecific white matter disease, likely due to chronic microvascular ischemia. No acute intracranial hemorrhage.   - Patient sent to ED from [facility] after findings of new stroke on imaging.  Stroke activated in the ED but not tPA candidate  - Neurology consulted  - Continue DAPT with ASA and Plavix.  - PT/OT/SLP consulted  ???  Possible UTI  -urine cultures pta with enterococcus faecalis and aerococcus urinae  -does not appear to have been treated. However pt with normal wbc and clinically improving  -will repeat ua and culture today    IDDM  - Continue PTA Lantus and continue LCDF    Orthostatic hypotension  - Continue PTA Midodrine  ???  CAD s/p PCI in 1997  HLD  - Denies chest pain, shortness of breath. Denies stress test since 1997.  - Continue DAPT for CVA as noted above  - Continue statin  ???  Depression  - Continue PTA Celexa    Inadequate oral intake  - Dietitian consulted    Bicytopenia (anemia and thrombocytopenia)  - Stable, continue to monitor    Malnutrition Details:  ICD-10 code E44: Chronic illness/Moderate non-severe malnutrition      Energy intake: < 75% of estimated energy requirement for 1 month or more, Weight loss: 20% x 1 year, Mild loss of muscle mass    Loss of Subcutaneous Fat: No      Muscle Wasting: Yes Moderate Clavicle, Interosseous, Temple  Edema: Yes Moderate (dendent )  Malnutrition Interventions: Allow liberalized diet, encouraged good PO efforts -will d/c remeron for increased sedation today. Cont boost supplement today    FEN: Regular diet, low mag  replaced  Ppx: Lovenox  DNAR-LI, palliative care team following, appreciate their assistance. Plan to discharge with hospice, likely tomorrow  Dispo: Continue inpatient admission  ________________________________________________________________________    Subjective  No events reported overnight. The patient is oriented to person and knew he was at [facility]. Pt awake but less interactive, pt denies pain nausea or dyspnea. Pt wife in room, updated on plan of care. She confirms she is calling hospice companies, hopeful for discharge soon.     Medications  Scheduled Meds:aspirin EC tablet 81 mg, 81 mg, Oral, QAM8  atorvastatin (LIPITOR) tablet 80 mg, 80 mg, Oral, QHS  bisacodyL (DULCOLAX) tablet 5 mg, 5 mg, Oral, QDAY  citalopram (CeleXA) tablet 20 mg, 20 mg, Oral, QAM8  clopiDOGrel (PLAVIX) tablet 75 mg, 75 mg, Oral, QDAY  dextran 70/hypromellose (GENTEAL TEARS) ophthalmic solution 1 drop, 1 drop, Both Eyes, TID  enoxaparin (LOVENOX) syringe 40 mg, 40 mg, Subcutaneous, QDAY(21)  furosemide (LASIX) tablet 40 mg, 40 mg, Oral, QDAY  insulin aspart U-100 (NOVOLOG FLEXPEN) injection PEN 0-6 Units, 0-6 Units, Subcutaneous, ACHS (22)  insulin glargine (LANTUS SOLOSTAR) injection PEN 5 Units, 5 Units, Subcutaneous, QHS  lactulose oral solution 20 g, 30 mL, Oral, Q4H    Or  lactulose(#) 200 g/300 mL rectal enema 200 g, 200 g, Rectal, Q4H  latanoprost (XALATAN) 0.005 % ophthalmic solution 1 drop, 1 drop, Both Eyes,  QHS  melatonin tablet 3 mg, 3 mg, Oral, QHS  midodrine (PROAMATINE) tablet 5 mg, 5 mg, Oral, TID  mirtazapine (REMERON) tablet 15 mg, 15 mg, Oral, QHS  pantoprazole DR (PROTONIX) tablet 20 mg, 20 mg, Oral, QDAY  spironolactone (ALDACTONE) tablet 100 mg, 100 mg, Oral, QDAY    Continuous Infusions:  PRN and Respiratory Meds:acetaminophen Q6H PRN, bisacodyL BID PRN, prochlorperazine Q6H PRN **OR** prochlorperazine Q6H PRN      Review of Systems:  A 14 point review of systems was negative except as documented in the above subjective section of note.       Objective:                          Vital Signs: Last Filed                 Vital Signs: 24 Hour Range   BP: 105/69 (09/01 0649)  Temp: 36.6 ???C (97.8 ???F) (09/01 1610)  Pulse: 94 (09/01 0649)  Respirations: 16 PER MINUTE (09/01 0649)  SpO2: 96 % (09/01 0649) BP: (96-140)/(64-81)   Temp:  [36.6 ???C (97.8 ???F)-37.2 ???C (99 ???F)]   Pulse:  [83-94]   Respirations:  [16 PER MINUTE-18 PER MINUTE]   SpO2:  [90 %-98 %]      Vitals:    03/13/19 0655 03/14/19 0732 03/15/19 0649   Weight: 67.9 kg (149 lb 11.1 oz) 67.3 kg (148 lb 5.9 oz) 67.7 kg (149 lb 4 oz)       Intake/Output Summary:  (Last 24 hours)    Intake/Output Summary (Last 24 hours) at 03/15/2019 9604  Last data filed at 03/15/2019 5409  Gross per 24 hour   Intake 0 ml   Output 0 ml   Net 0 ml      Stool Occurrence: 1    Physical Exam  General:  Awake, alert, cooperative, no distress, appears stated age  Lungs:  Clear to auscultation bilaterally  Heart:   Regular rate and rhythm  Abdomen:  Soft, non-tender. Bowel sounds normal.   Extremities: Extremities normal, atraumatic, no cyanosis or edema  Skin: Skin color, texture, turgor normal. No rashes or lesions  Neurologic:  Lethargic, oriented to person and place. 1/5 strength b/l LEs, 3/5 strength b/l UEs, mild asterixis, slurred speech, R facial droop      Lab Review  24-hour labs:    Results for orders placed or performed during the hospital encounter of 03/09/19 (from the past 24 hour(s))   POC GLUCOSE    Collection Time: 03/14/19  8:28 AM   Result Value Ref Range    Glucose, POC 157 (H) 70 - 100 MG/DL   POC GLUCOSE    Collection Time: 03/14/19 11:44 AM   Result Value Ref Range    Glucose, POC 174 (H) 70 - 100 MG/DL   POC GLUCOSE    Collection Time: 03/14/19  5:27 PM   Result Value Ref Range    Glucose, POC 170 (H) 70 - 100 MG/DL POC GLUCOSE    Collection Time: 03/14/19 10:16 PM   Result Value Ref Range    Glucose, POC 142 (H) 70 - 100 MG/DL   CBC    Collection Time: 03/15/19  4:58 AM   Result Value Ref Range    White Blood Cells 6.9 4.5 - 11.0 K/UL    RBC 4.09 (L) 4.4 - 5.5 M/UL    Hemoglobin 12.1 (L) 13.5 - 16.5 GM/DL    Hematocrit  36.4 (L) 40 - 50 %    MCV 88.9 80 - 100 FL    MCH 29.5 26 - 34 PG    MCHC 33.2 32.0 - 36.0 G/DL    RDW 16.1 11 - 15 %    Platelet Count 157 150 - 400 K/UL    MPV 7.4 7 - 11 FL   COMPREHENSIVE METABOLIC PANEL    Collection Time: 03/15/19  4:58 AM   Result Value Ref Range    Sodium 138 137 - 147 MMOL/L    Potassium 3.9 3.5 - 5.1 MMOL/L    Chloride 101 98 - 110 MMOL/L    Glucose 107 (H) 70 - 100 MG/DL    Blood Urea Nitrogen 11 7 - 25 MG/DL    Creatinine 0.96 0.4 - 1.24 MG/DL    Calcium 8.7 8.5 - 04.5 MG/DL    Total Protein 5.5 (L) 6.0 - 8.0 G/DL    Total Bilirubin 1.8 (H) 0.3 - 1.2 MG/DL    Albumin 2.4 (L) 3.5 - 5.0 G/DL    Alk Phosphatase 409 (H) 25 - 110 U/L    AST (SGOT) 30 7 - 40 U/L    CO2 29 21 - 30 MMOL/L    ALT (SGPT) 18 7 - 56 U/L    Anion Gap 8 3 - 12    eGFR Non African American >60 >60 mL/min    eGFR African American >60 >60 mL/min   MAGNESIUM    Collection Time: 03/15/19  4:58 AM   Result Value Ref Range    Magnesium 1.6 1.6 - 2.6 mg/dL   PHOSPHORUS    Collection Time: 03/15/19  4:58 AM   Result Value Ref Range    Phosphorus 3.9 2.0 - 4.5 MG/DL   PROTIME INR (PT)    Collection Time: 03/15/19  4:58 AM   Result Value Ref Range    INR 1.2 0.8 - 1.2   POC GLUCOSE    Collection Time: 03/15/19  7:55 AM   Result Value Ref Range    Glucose, POC 122 (H) 70 - 100 MG/DL       Point of Care Testing  (Last 24 hours)  Glucose: (!) 107 (03/15/19 0458)  POC Glucose (Download): (!) 122 (03/15/19 0755)    Radiology and other Diagnostics Review:    Pertinent radiology reviewed.    Synthia Innocent, MD   Pager [phone]

## 2019-03-15 NOTE — Care Plan
Problem: Nutrition Deficit  Goal: Adequate nutritional intake  Note: Patient plans to d/c on home hospice today. Spoke with wife at bedside. He is very lethargic and refuses to eat most of the time. She got him to eat a bowl of cereal and 1/2 banana for lunch today. He also likes cottage cheese. He enjoys more savory vs sweet foods such as Boost. Discussed offering patient creamy soups at home, boost supplements and cottage cheese or other foods he enjoys. Spouse denied any additional nutritional questions/concerns at this time. Meds/labs reviewed.      Recommendations:  Cont Low Sodium Diet  Offer supplements PRN      Hermina Barters, MS, RD, CSR, LD    Office: [phone]   [place]: [phone]

## 2019-03-15 NOTE — Discharge Instructions - Pharmacy
Physician Discharge Summary      Name: Mitchell Lucero  Medical Record Number: [id number]        Account Number:  [id number]  Date Of Birth:  09-11-1950                         Age:  68 years   Admit date:  03/09/2019                     Discharge date:  03/16/2019    Attending Physician:  Synthia Innocent, MD                Service: Med Private E- [phone]    Physician Summary completed by: Synthia Innocent, MD     Reason for hospitalization:   68 y.o.   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 Patient admitted to medicine for hepatic encephalopathy, progression/acute stroke.     Significant PMH:   Medical History:   Diagnosis Date   ??? CAD (coronary artery disease) 1997    S/P pci   ??? 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 DM (diabetes mellitus) (HCC)    ??? Hypertension    ??? Stroke (HCC)     12/2018 (first one), 01/2019 , 02/2019   ??? Tremors of nervous system           Allergies: Patient has no known allergies.    Admission Physical Exam notable for:      General:  Alert but delayed in response .  VS as above. No acute distress .     Eyes: No conjunctival injection.  PERRLA.   No scleral icterus.    Ears, nose, mouth, and throat : No erythema.   MMM.   No erythema, exudate noted.    Neck:  Symmetric appearance without crepitus, no obvious mass or noticeable swelling,   Lungs:  No use of accessory muscles.  Clear to auscultation bilaterally without wheezes, rales, or rhonchi.   Cardiovascular: Regular rate, S1, S2 normal, no murmurs, clicks, rubs, or gallops appreciated .  2+ and symmetric, all extremities.  No edema in BLE. Abdomen:  BS+, soft, no guarding or rigidity.  Nontender to palpation . +distended  Skin: Skin color normal, no obvious evidence of rashes.   Musculoskeletal:  4/5 distal strength in BLE, RUE weakness >LUE w/ contracted right hands.    Neurologic:  Alerted but disoriented to time. Weakness in BLE 4/5 and RUE weakness >LUE. Right hand contracted.    Psych:  Alerted , calm, normal affect    Admission Lab/Radiology studies notable for: wbc 7.2    Brief Hospital Course:  The patient was admitted and the following issues were addressed during this hospitalization: (with pertinent details).        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 Patient admitted to medicine for hepatic encephalopathy, progression/acute stroke.   ???  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 LIRADS 5. Cirrhosis with portal hypertension, including mild splenomegaly,  portosystemic varices, and small volume ascites. Mild peritoneal thickening and enhancement, which may reflect  infectious peritonitis. Metastatic disease could appear similar. Prominent periportal, portacaval, and epiphrenic lymph nodes, which are indeterminate and may be reactive or metastatic. Cholelithiasis.     On admission, Tbili 1.5, albumin 2.5, Alk phos 200, AST/ALT WNL, INR 1.2. pt  afebrile, no leukocytosis. CXR showed improvement in patchy bibasilar opacities, now minimal, likely atelectasis. No new acute cardiopulmonary findings. UDS negative. UA w/o evidence of infection but with hematuria. Paracentesis with no evidence of SBP    Abd U/S w/ dopplers showed patent hepatic vessels with normal direction of blood flow. 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 There are 2 smaller observations elsewhere in the liver which are subthreshold for imaging characterization, possibly small regenerative nodules. These can be reevaluated on surveillance CT/MR studies for known hepatoma. Portal hypertension, with mild splenomegaly and small volume ascites.???Cholelithiasis and gallbladder sludge. Circumferential bladder wall thickening, which is nonspecific though often related to chronic bladder outlet obstruction or cystitis.     PVR bladder scan    Hepatology consulted, noted treating his hepatocellular carcinoma would not affect his overall short-term prognosis given his recurrent strokes. Hepatology recommends Lasix and Aldactone initiation, restarted on 8/30    Blood and ascitic fluid cultures from 8/27 were negative     Baclofen held on admission.  MS improved with lactulose enema and holding baclofen.     Pt and family have elected to discharge to home with hospice. Continue Lactulose PO  titrate 3-4 bowel movements per day. Pt with increased somnolence, encouraged use of lactulose enema. Miralax ordered, however pt is not taking a good volume of liquid unclear if he will tolerate taking.     Recurrent strokes w/ progression of now moderate acute-early subacute right superior cerebellar-brachium pontis infarcts (right SCA territory) on 03/09/2019 MRI  Dysarthria, gait imbalance, weakness  MRI Head on 8/26 showed progression of now moderate acute-early subacute right superior cerebellar-brachium pontis infarcts (right SCA territory). New tiny acute or early subacute right tectal-superior cerebellar peduncle infarct. No hemorrhagic conversion or significant associated mass effect. Expected evolution of late subacute-chronic left lateral pontine infarct with wallerian degeneration in the left brachium pontis. Persistent mild generalized cerebral volume loss and marked nonspecific supratentorial white matter disease, likely due to chronic small vessel ischemic changes.     CT Head 8/26 showed redemonstration of recent right superior cerebellar artery territory infarct. The new small area of infarct (on same-day MRI) in the right superior cerebellar peduncle and tectum is of similar density to the adjacent SCA territory infarct and is also likely subacute. Redemonstration of subacute to chronic left pontine infarct with evolving wallerian degeneration of the left brachium pontis. Moderate to marked nonspecific white matter disease, likely due to chronic microvascular ischemia. No acute intracranial hemorrhage.     Patient sent to ED from [facility] after findings of new stroke on imaging.  Stroke activated in the ED but not tPA candidate, Neurology consulted, rec continue DAPT with ASA and Plavix.  ???  Possible  UTI  -urine cultures pta with enterococcus faecalis and aerococcus urinae. It does not appear to have been treated, unclear if this was straight cath sample. However pt with normal wbc and clinically improving. Did not repeat as this would have required straight cath, and pt had change GOC at that time.     IDDM  - Continue PTA Lantus and continue LCDF    Orthostatic hypotension  - Continue PTA Midodrine  ???  CAD s/p PCI in 1997  HLD  - Denies chest pain, shortness of breath. Denies stress test since 1997. Continue DAPT for CVA as noted above  ???  Depression  - Continue PTA Celexa    Inadequate oral intake  - Dietitian consulted    Bicytopenia (anemia and thrombocytopenia)  - Stable, continue to monitor    Malnutrition Details:  ICD-10 code E44: Chronic illness/Moderate non-severe malnutrition Energy intake: < 75% of estimated energy requirement for 1 month or more, Weight loss: 20% x 1 year, Mild loss of muscle mass    Loss of Subcutaneous Fat: No      Muscle Wasting: Yes Moderate Clavicle, Interosseous, Temple  Edema: Yes Moderate (dendent )  Malnutrition Interventions: Allow liberalized diet, encouraged good PO efforts     Trialed remeron for appetite stimulation, however pt with increased sedation, will not discharge on this med. Cont boost supplement today as tolerated        Condition at Discharge: Terminal, comfort measures    Discharge Diagnoses:      Hospital Problems        Active Problems    * (Principal) Acute encephalopathy          Surgical Procedures: None    Significant Diagnostic Studies and Procedures: noted in brief hospital course    Consults:  Hepatology, Neurology and Palliative Care    Patient Disposition: Home with Hospice       Patient instructions/medications:       Regular Diet    You have no dietary restriction. Please continue with a healthy balanced diet.     Report These Signs and Symptoms    Please contact your doctor if you have any of the following symptoms:uncontrolled pain, persistent nausea and/or vomiting or difficulty breathing     Questions About Your Stay    If you have an emergency after discharge, please dial [phone].    You may contact your discharging physician up to 7 days after discharge for questions about your hospitalization, discharge instructions, or medications by calling [phone] during regular business hours (8AM-4PM) and asking to speak to the doctor listed on the discharge information.       If you are not calling during business hours, ask for the on-call doctor.    If you have new  or worsening symptoms, you may be directed to your primary care provider (PCP) for ongoing questions, an Emergency Department, or an Urgent Care Clinic for a more immediate evaluation.    For all calls or questions more than 7 days after discharge, please contact your primary care provider (PCP).    For medications after discharge: pain (opioid) medicine cannot be refilled or prescribed by calling your discharging physician.  These medications need to be filled by your primary care provider (PCP).  Regular refill requests should be directed to your primary care provider (PCP).     Discharging attending physician: Synthia Innocent [[id number]]      Activity as Tolerated    It is important to keep increasing your  activity level after you leave the hospital.  Moving around can help prevent blood clots, lung infection (pneumonia) and other problems.  Gradually increasing the number of times you are up moving around will help you return to your normal activity level more quickly.  Continue to increase the number of times you are up to the chair and walking daily to return to your normal activity level. Begin to work toward your normal activity level at discharge      Current Discharge Medication List       START taking these medications    Details   diazePAM (DIAZEPAM INTENSOL) 5 mg/mL solution Take 0.1 mL by mouth every 2 hours as needed.  Qty: 30 mL, Refills: 0    PRESCRIPTION TYPE:  Print      furosemide (LASIX) 40 mg tablet Take one tablet by mouth daily.  Qty: 90 tablet, Refills: 0    PRESCRIPTION TYPE:  Print      haloperidol (HALDOL) 2 mg/mL oral solution Take 0.5 mL by mouth every 4 hours as needed. As needed for agitation or nausea  Qty: 30 mL, Refills: 1    PRESCRIPTION TYPE:  Print      lactulose(#) 200 g/300 mL Insert or Apply 300 mL to rectal area as directed every 4 hours as needed.  Qty: 300 mL, Refills: 1    PRESCRIPTION TYPE:  Print      LORazepam (ATIVAN) 2 mg/mL conc oral solution Take 0.25 mL by mouth every 2 hours as needed for Nausea or Other... (anxiety).  Qty: 30 mL, Refills: 1    PRESCRIPTION TYPE:  Print      morphine (ROXANOL) 100 mg/5 mL (20 mg/mL) oral concentrate Take 0.25 mL by mouth every 1 hour as needed  Qty: 180 mL, Refills: 0 PRESCRIPTION TYPE:  Print      spironolactone (ALDACTONE) 100 mg tablet Take one tablet by mouth daily. Take with food.  Qty: 90 tablet, Refills: 0    PRESCRIPTION TYPE:  Print          CONTINUE these medications which have NOT CHANGED    Details   acetaminophen (TYLENOL) 325 mg tablet Take two tablets by mouth every 4 hours as needed.  Qty:      PRESCRIPTION TYPE:  No Print      aspirin EC 81 mg tablet Take one tablet by mouth every morning. Take with food.  Qty: 90 tablet    PRESCRIPTION TYPE:  No Print      atorvastatin (LIPITOR) 80 mg tablet Take 80 mg by mouth at bedtime daily.    PRESCRIPTION TYPE:  Historical Med      Bimatoprost 0.01 % drop Apply 1 drop to both eyes as Needed.    PRESCRIPTION TYPE:  No Print      bisacodyL (DULCOLAX) 10 mg rectal suppository Insert or Apply one suppository to rectal area as directed daily as needed.  Qty:      PRESCRIPTION TYPE:  No Print      citalopram (CELEXA) 40 mg tablet Take 20 mg by mouth every morning.    PRESCRIPTION TYPE:  Historical Med      clopiDOGrel (PLAVIX) 75 mg tablet Take one tablet by mouth daily.  Qty: 60 tablet, Refills: 0    PRESCRIPTION TYPE:  Print  Comments: For stroke. Can stop after runs out to complete 3 months.      insulin glargine (LANTUS SOLOSTAR) 100 unit/mL (3 mL) injection PEN Inject five Units under the skin  at bedtime daily.  Qty: 45 mL    PRESCRIPTION TYPE:  No Print  Comments: The pharmacist may select Lantus Solorstar or Elon Jester based on what is cheaper for the patient.      lactulose 10 gram/15 mL oral solution Take 30 mL by mouth three times daily with meals.  Qty:      PRESCRIPTION TYPE:  No Print      melatonin 3 mg tab Take one tablet by mouth at bedtime daily.    PRESCRIPTION TYPE:  OTC      midodrine (PROAMATINE) 5 mg tablet Take one tablet by mouth three times daily.  Qty:      PRESCRIPTION TYPE:  No Print      ondansetron (ZOFRAN ODT) 4 mg rapid dissolve tablet Dissolve one tablet by mouth before meals and at bedtime. Place on tongue to disolve.  Qty: 30 tablet    PRESCRIPTION TYPE:  No Print      pantoprazole DR (PROTONIX) 20 mg tablet Take one tablet by mouth daily.  Qty: 30 tablet, Refills: 1    PRESCRIPTION TYPE:  Normal          The following medications were removed from your list. This list includes medications discontinued this stay and those removed from your prior med list in our system        aluminum/magnesium hydroxide (MAALOX) 200/200 mg/5 mL susp oral suspension        baclofen (LIORESAL) 10 mg tablet        docusate (COLACE) 100 mg capsule        enoxaparin (LOVENOX) 40 mg injection syringe        insulin aspart U-100 (NOVOLOG FLEXPEN) 100 unit/mL (3 mL) injection PEN        insulin regular (NOVOLIN R) 100 unit/mL injection        loperamide (IMODIUM A-D) 2 mg capsule        magnesium citrate oral solution        milk of magnesia (CONC) 2,400 mg/10 mL oral suspension        polyethylene glycol 3350 (MIRALAX) 17 g packet        senna (SENOKOT) 8.6 mg tablet        traZODone (DESYREL) 50 mg tablet        vitamin A & D oint            Scheduled appointments:    Mar 23, 2019  9:00 AM CDT  (Arrive by 8:45 AM)  New Patient with Harsh Delia Chimes, MD  [facility] (Neurology) [address]  [address]  [place] [address]  [phone]   Apr 20, 2019 11:30 AM CDT  (Arrive by 11:15 AM)  Return Patient with Su Monks, MD  [facility] Medicine) [address]  [place] [address]  [phone]   May 13, 2019  2:00 PM CDT  Return Patient with Jill Alexanders, MBChB  [facility] (Neurology) [address]  [place] [address]  [phone]          Pending items needing follow up: none    Signed:  Synthia Innocent, MD  03/16/2019      cc:  Primary Care Physician:  Wilford Grist   Verified  Referring physicians:  No ref. provider found   Additional provider(s):

## 2019-03-15 NOTE — Progress Notes
2328- Notified Dr. Brett Albino of pt experiencing increased lethargy and requiring sternal rub to elicit response. Notified that pt's vitals are BP 96/65, O2 98, HR 84. MD stated it was OK to let pt sleep and skip 0000 dose of lactulose per pt's wife's request. Wife stated she would not like any further intervention at this time due to pt going home on hospice in the morning.

## 2019-03-15 NOTE — Progress Notes
PALLIATIVE CARE INPATIENT NOTE     Name: Mitchell Lucero            MRN: [id number]                DOB: 1950-08-29          Age: 68 y.o.  Admission Date: 03/09/2019             LOS: 6 days    ASSESSMENT     Mitchell Lucero is a 68 y.o. male with 68 y/o man w/ CAD s/p PCI, DM, orthostatic hypotension, multiple recurrent CVA, HCC diagnosed in 01/2019, NASH cirrhosis who was recently discharged to [facility] from an admission to neurolgoy service for AMS, found to have hepatic encephalopathy and progession of subactue cerebellar infarcts.       Advance Care Planning:   Code Status: DNAR-Limited Intervention  Identified Health Care Decision Maker:  Wife Quade, Ramirez  Spouse  [phone]   [phone]    Trinna Post  Daughter  [phone]   [phone]    Lamp, Peter Minium    [phone]    lasater, sabrina  Daughter    [phone]      Prognosis (Estimated):  Based on current function, current medical issues, goals of care, and prognosis models, my estimated prognosis is weeks.  Potential Disposition: Likely home with hospice when an agency that serves his area can be identified.     PLAN       #Hepatic encephalopathy   #Recurrent CVAs  #Ascites    Encephalopathy   - Continue lactulose enemas as this seems to be the most helpful/tolerated intervention.   - Okay to use oral lactulose or miralax (whatever is more effective) as tolerated though wife notes that he had discomfort trying to take in much volume earlier today.     Ascites   - Does not appear to need a paracentesis or pleurex right now - will need to ask hospice provider if they would be able to accommodate paracentesis or pleurex in the future if indicated.    - Continue diuretics as tolerated    Recurrent CVA   - Reasonable to continue ASA/Plavix - will need to ensure that hospice can accommodate.     On discharge, would continue the following medications:  Asa 81mg  q day  Dulcolax 5mg  PO q day  Celexa 20mg  q day  Plavix 75mg  q day Lasix 40mg  PO q day  Lantus  Lactulose (both oral and enema)  Midodrine 5mg  TID  Remeron - might reduce to 7.5mg  q HS as patient has a tendency to be quite fatigued at baseline  Protonix  Spironolactone    Would also order:  Roxanol 5mg  PO/SL q 1 hour PRN pain/dyspnea  Lorazepam intensol 0.5mg  PO/SL q 2 hours PRN anxiety/nausea  Haloperidol suspension 1mg  PO/SL q 4 hours PRN agitation/nausea      Discussion:   Wife at bedside today.  They are interested in going home with hospice.  Wife expressed that she doesn't like the word hospice as she feels it means that he is giving up the option of seeking more care.  We reframed this in terms of hospice bringing care to him in a place that he is comfortable.  Also discussed that even if he were to come back to the hospital, there is no guarantee that interventions would help him to get any better.  She found peace in this.  She also inquired about his liver  and if there were any treatments that could help his cirrhosis.  I let her know that typically, treatment for cirrhosis is supportive with things like lactulose, but the only thing that could actually fix his liver dysfunction would be a transplant which she recognizes he isn't a candidate for.  She felt much better knowing this and knowing that they have tried everything to help him.  Patient was able to again confirm his desire to go home and not come back to the hospital.     We will continue to assist with hospice discharge planning.  TPOPP completed today with CMO elected.       SUBJECTIVE     CC/Reason for Visit: Condition check, plan of care.       Subjective   Patient is much more alert today.  Wife notes that last night he was very lethargic and she worried he was drifting into a coma.  He had a lactulose enema and this was very helpful and he is more interactive today.  Patient denied any discomfort during our visit.     ROS:   +AMS  +weakness  +Poor appetite        OBJECTIVE Blood pressure 126/75, pulse 85, temperature 37.1 ???C (98.8 ???F), height 160 cm (63), weight 67.7 kg (149 lb 4 oz), SpO2 99 %.   Physical Exam  Constitutional: Appears stated age, resting in bed. More alert and interactive today, in no distress  HEENT: NC/AT, EOMI  Lungs:  Even, non-labored  Neuro: Face symmetric, no tremor or myoclonus  Abd: Soft, non-tender, + fluid wave  Skin: Warm/dry  Psych: Calm  Ext: No edema noted      Current PPS%:   30    Lab Results:  CBC   Lab Results   Component Value Date/Time    WBC 6.9 03/15/2019 04:58 AM    HGB 12.1 (L) 03/15/2019 04:58 AM    PLTCT 157 03/15/2019 04:58 AM     Lab Results   Component Value Date/Time    NEUT 50 03/09/2019 06:02 PM    ANC 3.40 03/09/2019 06:02 PM      Chemistries   Lab Results   Component Value Date/Time    NA 138 03/15/2019 04:58 AM    K 3.9 03/15/2019 04:58 AM    BUN 11 03/15/2019 04:58 AM    CR 0.85 03/15/2019 04:58 AM    GLU 107 (H) 03/15/2019 04:58 AM     Lab Results   Component Value Date/Time    CA 8.7 03/15/2019 04:58 AM    PO4 3.9 03/15/2019 04:58 AM    ALBUMIN 2.4 (L) 03/15/2019 04:58 AM    TOTPROT 5.5 (L) 03/15/2019 04:58 AM    ALKPHOS 166 (H) 03/15/2019 04:58 AM    AST 30 03/15/2019 04:58 AM    ALT 18 03/15/2019 04:58 AM    TOTBILI 1.8 (H) 03/15/2019 04:58 AM    GFR >60 03/15/2019 04:58 AM    GFRAA >60 03/15/2019 04:58 AM        Katharina Caper, MD  Palliative Care  [phone]  Team [phone]  Also on Voalte and Cureatr.

## 2019-03-15 NOTE — Progress Notes
PHYSICAL THERAPY  NOTE      Name: Mitchell Lucero        MRN: [id number]          DOB: January 10, 1951          Age: 68 y.o.  Admission Date: 03/09/2019             LOS: 6 days        Deferred activity this date. From palliative conversations, patient plans to discharge home with hospice care. Patient with very little oral intake today and has been very lethargic. Not appropriate to participate in meaningful therapy. Will continue to follow and provide support as needed.     Therapist: Joie Bimler  Date: 03/15/2019

## 2019-03-16 ENCOUNTER — Encounter: Admit: 2019-03-09 | Discharge: 2019-03-09

## 2019-03-16 ENCOUNTER — Encounter: Admit: 2019-03-10 | Discharge: 2019-03-10

## 2019-03-16 ENCOUNTER — Encounter: Admit: 2019-03-16 | Discharge: 2019-03-16

## 2019-03-16 ENCOUNTER — Emergency Department: Admit: 2019-03-09 | Discharge: 2019-03-09

## 2019-03-16 DIAGNOSIS — N390 Urinary tract infection, site not specified: Secondary | ICD-10-CM

## 2019-03-16 DIAGNOSIS — R2689 Other abnormalities of gait and mobility: Secondary | ICD-10-CM

## 2019-03-16 DIAGNOSIS — F329 Major depressive disorder, single episode, unspecified: Secondary | ICD-10-CM

## 2019-03-16 DIAGNOSIS — Z9861 Coronary angioplasty status: Secondary | ICD-10-CM

## 2019-03-16 DIAGNOSIS — R471 Dysarthria and anarthria: Secondary | ICD-10-CM

## 2019-03-16 DIAGNOSIS — E119 Type 2 diabetes mellitus without complications: Secondary | ICD-10-CM

## 2019-03-16 DIAGNOSIS — K7200 Acute and subacute hepatic failure without coma: Secondary | ICD-10-CM

## 2019-03-16 DIAGNOSIS — I6389 Other cerebral infarction: Secondary | ICD-10-CM

## 2019-03-16 DIAGNOSIS — K7581 Nonalcoholic steatohepatitis (NASH): Secondary | ICD-10-CM

## 2019-03-16 DIAGNOSIS — R188 Other ascites: Secondary | ICD-10-CM

## 2019-03-16 DIAGNOSIS — R402253 Coma scale, best verbal response, oriented, at hospital admission: Secondary | ICD-10-CM

## 2019-03-16 DIAGNOSIS — Z66 Do not resuscitate: Secondary | ICD-10-CM

## 2019-03-16 DIAGNOSIS — Z515 Encounter for palliative care: Secondary | ICD-10-CM

## 2019-03-16 DIAGNOSIS — Z1159 Encounter for screening for other viral diseases: Secondary | ICD-10-CM

## 2019-03-16 DIAGNOSIS — R29710 NIHSS score 10: Secondary | ICD-10-CM

## 2019-03-16 DIAGNOSIS — R402363 Coma scale, best motor response, obeys commands, at hospital admission: Secondary | ICD-10-CM

## 2019-03-16 DIAGNOSIS — Z794 Long term (current) use of insulin: Secondary | ICD-10-CM

## 2019-03-16 DIAGNOSIS — I69351 Hemiplegia and hemiparesis following cerebral infarction affecting right dominant side: Secondary | ICD-10-CM

## 2019-03-16 DIAGNOSIS — Z6827 Body mass index (BMI) 27.0-27.9, adult: Secondary | ICD-10-CM

## 2019-03-16 DIAGNOSIS — M21371 Foot drop, right foot: Secondary | ICD-10-CM

## 2019-03-16 DIAGNOSIS — R402143 Coma scale, eyes open, spontaneous, at hospital admission: Secondary | ICD-10-CM

## 2019-03-16 DIAGNOSIS — K7469 Other cirrhosis of liver: Secondary | ICD-10-CM

## 2019-03-16 DIAGNOSIS — C220 Liver cell carcinoma: Secondary | ICD-10-CM

## 2019-03-16 DIAGNOSIS — E785 Hyperlipidemia, unspecified: Secondary | ICD-10-CM

## 2019-03-16 DIAGNOSIS — K766 Portal hypertension: Secondary | ICD-10-CM

## 2019-03-16 DIAGNOSIS — I951 Orthostatic hypotension: Secondary | ICD-10-CM

## 2019-03-16 DIAGNOSIS — D649 Anemia, unspecified: Secondary | ICD-10-CM

## 2019-03-16 DIAGNOSIS — I6503 Occlusion and stenosis of bilateral vertebral arteries: Secondary | ICD-10-CM

## 2019-03-16 DIAGNOSIS — D696 Thrombocytopenia, unspecified: Secondary | ICD-10-CM

## 2019-03-16 DIAGNOSIS — I10 Essential (primary) hypertension: Secondary | ICD-10-CM

## 2019-03-16 DIAGNOSIS — I2510 Atherosclerotic heart disease of native coronary artery without angina pectoris: Secondary | ICD-10-CM

## 2019-03-16 DIAGNOSIS — K5900 Constipation, unspecified: Secondary | ICD-10-CM

## 2019-03-16 DIAGNOSIS — E440 Moderate protein-calorie malnutrition: Secondary | ICD-10-CM

## 2019-03-16 LAB — PROTIME INR (PT): Lab: 1.3 mg/dL — ABNORMAL HIGH (ref >60)

## 2019-03-16 LAB — CULTURE-WOUND/TISSUE/FLUID(AEROBIC ONLY)W/SENSITIVITY

## 2019-03-16 LAB — CULTURE-BLOOD W/SENSITIVITY

## 2019-03-16 LAB — COMPREHENSIVE METABOLIC PANEL
Lab: 139 MMOL/L — ABNORMAL LOW (ref 137–147)
Lab: 3.6 MMOL/L — ABNORMAL LOW (ref 3.5–5.1)

## 2019-03-16 LAB — POC GLUCOSE
Lab: 133 mg/dL — ABNORMAL HIGH (ref 70–100)
Lab: 102 mg/dL — ABNORMAL HIGH (ref 70–100)

## 2019-03-16 LAB — CBC: Lab: 8 K/UL — ABNORMAL LOW (ref 4.5–11.0)

## 2019-03-16 LAB — MAGNESIUM: Lab: 1.6 mg/dL — ABNORMAL HIGH (ref 1.6–2.6)

## 2019-03-16 LAB — PHOSPHORUS: Lab: 3.4 mg/dL — ABNORMAL LOW (ref >60)

## 2019-03-16 MED ORDER — LORAZEPAM 2 MG/ML PO CONC
.5 mg | ORAL | 1 refills | 12.00000 days | Status: AC | PRN
Start: 2019-03-16 — End: ?

## 2019-03-16 MED ORDER — FUROSEMIDE 40 MG PO TAB
40 mg | ORAL_TABLET | Freq: Every day | ORAL | 0 refills | 90.00000 days | Status: AC
Start: 2019-03-16 — End: ?

## 2019-03-16 MED ORDER — MORPHINE CONCENTRATE 100 MG/5 ML (20 MG/ML) PO SOLN
5 mg | ORAL | 0 refills | 7.00000 days | Status: AC | PRN
Start: 2019-03-16 — End: ?

## 2019-03-16 MED ORDER — SPIRONOLACTONE 100 MG PO TAB
100 mg | ORAL_TABLET | Freq: Every day | ORAL | 0 refills | 46.00000 days | Status: AC
Start: 2019-03-16 — End: ?

## 2019-03-16 MED ORDER — MIRTAZAPINE 15 MG PO TAB
7.5 mg | ORAL_TABLET | Freq: Every evening | ORAL | 0 refills | Status: CN
Start: 2019-03-16 — End: ?

## 2019-03-16 MED ORDER — DIAZEPAM 5 MG/ML PO CONC
.5 mg | ORAL | 0 refills | 7.00000 days | Status: AC | PRN
Start: 2019-03-16 — End: ?

## 2019-03-16 MED ORDER — BISACODYL 5 MG PO TBEC
5 mg | ORAL_TABLET | Freq: Every day | ORAL | 0 refills | Status: CN
Start: 2019-03-16 — End: ?

## 2019-03-16 MED ORDER — HALOPERIDOL LACTATE 2 MG/ML PO CONC
1 mg | ORAL | 1 refills | Status: AC | PRN
Start: 2019-03-16 — End: ?

## 2019-03-16 MED ORDER — LACTULOSE(#) 200GM/300ML RECTAL ENEMA
200 g | RECTAL | 1 refills | 21.00000 days | Status: AC | PRN
Start: 2019-03-16 — End: ?

## 2019-03-16 NOTE — Progress Notes
Mitchell Lucero discharged on 03/16/2019.   Marland Kitchen  Discharge instructions reviewed with family-pts wife.  Valuables returned:   Personal Items / Valuables: Clothing, Eyeglasses/Contacts, Cell Phone.  Home medications:    .  Functional assessment at discharge complete: Yes .    Discharge paperwork reviewed with wife with no questions/concerns. All IV access removed. Ambulance form, face sheet, and DNR form given to EMS.

## 2019-03-16 NOTE — Progress Notes
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 Patient admitted to medicine for hepatic encephalopathy, progression/acute stroke.   ???  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 LIRADS 5. Cirrhosis with portal hypertension, including mild splenomegaly,  portosystemic varices, and small volume ascites. Mild peritoneal thickening and enhancement, which may reflect  infectious peritonitis. Metastatic disease could appear similar. Prominent periportal, portacaval, and epiphrenic lymph nodes, which are indeterminate and may be reactive or metastatic. Cholelithiasis.   - Tbili 1.5, albumin 2.5, Alk phos 200, AST/ALT WNL, INR 1.2         - Afebrile, no leukocytosis  - CXR showed improvement in patchy bibasilar opacities, now minimal, likely atelectasis. No new acute cardiopulmonary findings.    - UDS negative. UA w/o evidence of infection but with hematuria. Paracentesis with no evidence of SBP  - Abd U/S w/ dopplers showed patent hepatic vessels with normal direction of blood flow. 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 There are 2 smaller observations elsewhere in the liver which are subthreshold for imaging characterization, possibly small regenerative nodules. These can be reevaluated on surveillance CT/MR studies for known hepatoma. Portal hypertension, with mild splenomegaly and small volume ascites.???Cholelithiasis and gallbladder sludge. Circumferential bladder wall thickening, which is nonspecific though often related to chronic bladder outlet obstruction or cystitis.   - PVR bladder scan  - Hepatology following. Note treating his hepatocellular carcinoma would not affect his overall short-term prognosis given his recurrent strokes  - Hepatology recommends Lasix and Aldactone initiation, restarted on 8/30  - Blood and ascitic fluid cultures from 8/27 were negative   -  Baclofen held on admission  - MS improved with lactulose enema and holding baclofen  -pt and family have elected to discharge to home with hospice  - Continue Lactulose PO  titrate 3-4 bowel movements per day. Pt with increased somnolence, encouraged use of lactulose enema. Miralax ordered, however pt is not taking a good volume of liquid unclear if he will tolerate taking.     Recurrent strokes w/ progression of now moderate acute-early subacute right superior cerebellar-brachium pontis infarcts (right SCA territory) on 03/09/2019 MRI  Dysarthria, gait imbalance, weakness  - MRI Head on 8/26 showed progression of now moderate acute-early subacute right superior cerebellar-brachium pontis infarcts (right SCA territory). New tiny acute or early subacute right tectal-superior cerebellar peduncle infarct. No hemorrhagic conversion or significant associated mass effect. Expected evolution of late subacute-chronic left lateral pontine infarct with wallerian degeneration in the left brachium pontis. Persistent mild generalized cerebral volume loss and marked nonspecific supratentorial white matter disease, likely due to chronic small vessel ischemic  changes. - CT Head 8/26 showed redemonstration of recent right superior cerebellar artery territory infarct. The new small area of infarct (on same-day MRI) in the right superior cerebellar peduncle and tectum is of similar density to the adjacent SCA territory infarct and is also likely subacute. Redemonstration of subacute to chronic left pontine infarct with evolving wallerian degeneration of the left brachium pontis. Moderate to marked nonspecific white matter disease, likely due to chronic microvascular ischemia. No acute intracranial hemorrhage.   - Patient sent to ED from [facility] after findings of new stroke on imaging.  Stroke activated in the ED but not tPA candidate  - Neurology consulted  - Continue DAPT with ASA and Plavix.  - PT/OT/SLP consulted  ???  Possible UTI  -urine cultures pta with enterococcus faecalis and aerococcus urinae  -does not appear to have been treated. However pt with normal wbc and clinically improving  -will repeat ua and culture today    IDDM  - Continue PTA Lantus and continue LCDF    Orthostatic hypotension  - Continue PTA Midodrine  ???  CAD s/p PCI in 1997  HLD  - Denies chest pain, shortness of breath. Denies stress test since 1997.  - Continue DAPT for CVA as noted above  - Continue statin  ???  Depression  - Continue PTA Celexa    Inadequate oral intake  - Dietitian consulted    Bicytopenia (anemia and thrombocytopenia)  - Stable, continue to monitor    Malnutrition Details:  ICD-10 code E44: Chronic illness/Moderate non-severe malnutrition      Energy intake: < 75% of estimated energy requirement for 1 month or more, Weight loss: 20% x 1 year, Mild loss of muscle mass    Loss of Subcutaneous Fat: No      Muscle Wasting: Yes Moderate Clavicle, Interosseous, Temple  Edema: Yes Moderate (dendent )  Malnutrition Interventions: Allow liberalized diet, encouraged good PO efforts   -will d/c remeron for increased sedation today. Cont boost supplement today as tolerated I have spent 45 minutes evaluating pt and preparing discharge.     ________________________________________________________________________    Subjective  No events reported overnight. The patient is oriented to person and knew he was at [facility]. Pt awake but less interactive, pt denies pain nausea or dyspnea. Pt wife in room, updated on plan of care. She is comfortable with hospice discharge today    Medications  Scheduled Meds:aspirin EC tablet 81 mg, 81 mg, Oral, QAM8  atorvastatin (LIPITOR) tablet 80 mg, 80 mg, Oral, QHS  bisacodyL (DULCOLAX) tablet 5 mg, 5 mg, Oral, QDAY  citalopram (CeleXA) tablet 20 mg, 20 mg, Oral, QAM8  clopiDOGrel (PLAVIX) tablet 75 mg, 75 mg, Oral, QDAY  dextran 70/hypromellose (GENTEAL TEARS) ophthalmic solution 1 drop, 1 drop, Both Eyes, TID  enoxaparin (LOVENOX) syringe 40 mg, 40 mg, Subcutaneous, QDAY(21)  furosemide (LASIX) tablet 40 mg, 40 mg, Oral, QDAY  insulin aspart U-100 (NOVOLOG FLEXPEN) injection PEN 0-6 Units, 0-6 Units, Subcutaneous, ACHS (22)  insulin glargine (LANTUS SOLOSTAR) injection PEN 5 Units, 5 Units, Subcutaneous, QHS  lactulose oral solution 20 g, 30 mL, Oral, Q4H    Or  lactulose(#) 200 g/300 mL rectal enema 200 g, 200 g, Rectal, Q4H  latanoprost (XALATAN) 0.005 % ophthalmic solution 1 drop, 1 drop, Both Eyes, QHS  melatonin tablet 3 mg, 3 mg, Oral, QHS  midodrine (PROAMATINE) tablet 5 mg, 5 mg, Oral, TID  pantoprazole DR (PROTONIX) tablet 20 mg, 20 mg, Oral, QDAY  polyethylene glycol 3350 (  MIRALAX) packet 17 g, 1 packet, Oral, Q3H while awake  spironolactone (ALDACTONE) tablet 100 mg, 100 mg, Oral, QDAY    Continuous Infusions:  PRN and Respiratory Meds:acetaminophen Q6H PRN, bisacodyL BID PRN, prochlorperazine Q6H PRN **OR** prochlorperazine Q6H PRN      Review of Systems:  A 14 point review of systems was negative except as documented in the above subjective section of note.       Objective: Vital Signs: Last Filed                 Vital Signs: 24 Hour Range   BP: 94/60 (09/02 0624)  Temp: 36.7 ???C (98 ???F) (09/02 4540)  Pulse: 106 (09/02 0624)  Respirations: 16 PER MINUTE (09/02 0624)  SpO2: 99 % (09/02 0624) BP: (94-133)/(60-85)   Temp:  [36.7 ???C (98 ???F)-37.1 ???C (98.8 ???F)]   Pulse:  [85-106]   Respirations:  [16 PER MINUTE-17 PER MINUTE]   SpO2:  [98 %-100 %]      Vitals:    03/13/19 0655 03/14/19 0732 03/15/19 0649   Weight: 67.9 kg (149 lb 11.1 oz) 67.3 kg (148 lb 5.9 oz) 67.7 kg (149 lb 4 oz)       Intake/Output Summary:  (Last 24 hours)    Intake/Output Summary (Last 24 hours) at 03/16/2019 0855  Last data filed at 03/16/2019 0200  Gross per 24 hour   Intake 50 ml   Output 0 ml   Net 50 ml      Stool Occurrence: 1    Physical Exam  General:  Awake, alert, cooperative, no distress, appears stated age  Lungs:  Clear to auscultation bilaterally  Heart:   Regular rate and rhythm  Abdomen:  Soft, non-tender. Bowel sounds normal.   Extremities: Extremities normal, atraumatic, no cyanosis or edema  Skin: Skin color, texture, turgor normal. No rashes or lesions  Neurologic:  Lethargic, oriented to person and place. 1/5 strength b/l LEs, 3/5 strength b/l UEs, mild asterixis, slurred speech, R facial droop      Lab Review  24-hour labs:    Results for orders placed or performed during the hospital encounter of 03/09/19 (from the past 24 hour(s))   POC GLUCOSE    Collection Time: 03/15/19 12:09 PM   Result Value Ref Range    Glucose, POC 184 (H) 70 - 100 MG/DL   POC GLUCOSE    Collection Time: 03/15/19  4:47 PM   Result Value Ref Range    Glucose, POC 170 (H) 70 - 100 MG/DL   POC GLUCOSE    Collection Time: 03/15/19  9:44 PM   Result Value Ref Range    Glucose, POC 133 (H) 70 - 100 MG/DL   CBC    Collection Time: 03/16/19  5:12 AM   Result Value Ref Range    White Blood Cells 8.0 4.5 - 11.0 K/UL    RBC 4.15 (L) 4.4 - 5.5 M/UL    Hemoglobin 12.3 (L) 13.5 - 16.5 GM/DL Hematocrit 98.1 (L) 40 - 50 %    MCV 87.9 80 - 100 FL    MCH 29.8 26 - 34 PG    MCHC 33.9 32.0 - 36.0 G/DL    RDW 19.1 11 - 15 %    Platelet Count 161 150 - 400 K/UL    MPV 7.3 7 - 11 FL   COMPREHENSIVE METABOLIC PANEL    Collection Time: 03/16/19  5:12 AM   Result Value Ref Range    Sodium 139  137 - 147 MMOL/L    Potassium 3.6 3.5 - 5.1 MMOL/L    Chloride 102 98 - 110 MMOL/L    Glucose 102 (H) 70 - 100 MG/DL    Blood Urea Nitrogen 13 7 - 25 MG/DL    Creatinine 1.61 0.4 - 1.24 MG/DL    Calcium 8.5 8.5 - 09.6 MG/DL    Total Protein 5.5 (L) 6.0 - 8.0 G/DL    Total Bilirubin 1.7 (H) 0.3 - 1.2 MG/DL    Albumin 2.4 (L) 3.5 - 5.0 G/DL    Alk Phosphatase 045 (H) 25 - 110 U/L    AST (SGOT) 28 7 - 40 U/L    CO2 30 21 - 30 MMOL/L    ALT (SGPT) 17 7 - 56 U/L    Anion Gap 7 3 - 12    eGFR Non African American >60 >60 mL/min    eGFR African American >60 >60 mL/min   MAGNESIUM    Collection Time: 03/16/19  5:12 AM   Result Value Ref Range    Magnesium 1.6 1.6 - 2.6 mg/dL   PHOSPHORUS    Collection Time: 03/16/19  5:12 AM   Result Value Ref Range    Phosphorus 3.4 2.0 - 4.5 MG/DL   PROTIME INR (PT)    Collection Time: 03/16/19  5:12 AM   Result Value Ref Range    INR 1.3 (H) 0.8 - 1.2   POC GLUCOSE    Collection Time: 03/16/19  7:33 AM   Result Value Ref Range    Glucose, POC 102 (H) 70 - 100 MG/DL       Point of Care Testing  (Last 24 hours)  Glucose: (!) 102 (03/16/19 0512)  POC Glucose (Download): (!) 102 (03/16/19 4098)    Radiology and other Diagnostics Review:    Pertinent radiology reviewed.    Synthia Innocent, MD   Pager [phone]

## 2019-03-16 NOTE — Progress Notes
I have reviewed the notes, assessment, and/or procedures performed by Caylor Trigg, RN and concur with her/his documentation unless otherwise noted.

## 2019-03-17 ENCOUNTER — Encounter: Admit: 2019-03-17 | Discharge: 2019-03-17

## 2019-03-17 NOTE — Telephone Encounter
Patient discharged home on hospice on 9/2. Navigator called to offer condolences and answer any questions the patient's wife may have. Spoke to Mitchell Lucero. SHe stated he was settling with hospice and doing well surrounded by family. General questions about hospice and liver cancer answered to the best of this RN's ability. Encourged her to dicussed with the patient's hospice nurses her additional concerns. Wished the patient and his family well. Althia Forts, RN

## 2019-03-18 ENCOUNTER — Ambulatory Visit: Admit: 2019-03-19 | Discharge: 2019-03-19

## 2019-03-18 ENCOUNTER — Encounter: Admit: 2019-03-18 | Discharge: 2019-03-18

## 2019-03-18 DIAGNOSIS — G459 Transient cerebral ischemic attack, unspecified: Secondary | ICD-10-CM

## 2019-03-22 ENCOUNTER — Encounter: Admit: 2019-03-22 | Discharge: 2019-03-22

## 2019-03-23 ENCOUNTER — Encounter: Admit: 2019-03-23 | Discharge: 2019-03-23

## 2019-04-12 ENCOUNTER — Encounter

## 2019-04-12 DIAGNOSIS — R69 Illness, unspecified: Secondary | ICD-10-CM

## 2019-05-02 ENCOUNTER — Encounter: Admit: 2019-05-02 | Discharge: 2019-05-02 | Payer: MEDICARE

## 2019-05-13 ENCOUNTER — Encounter: Admit: 2019-05-13 | Discharge: 2019-05-13 | Payer: MEDICARE

## 2019-05-15 DEATH — deceased

## 2021-04-01 IMAGING — CR CHEST
1 series · 1 of 1 positions shown · non-contrast
Comparison: none

[chest port x-wise]
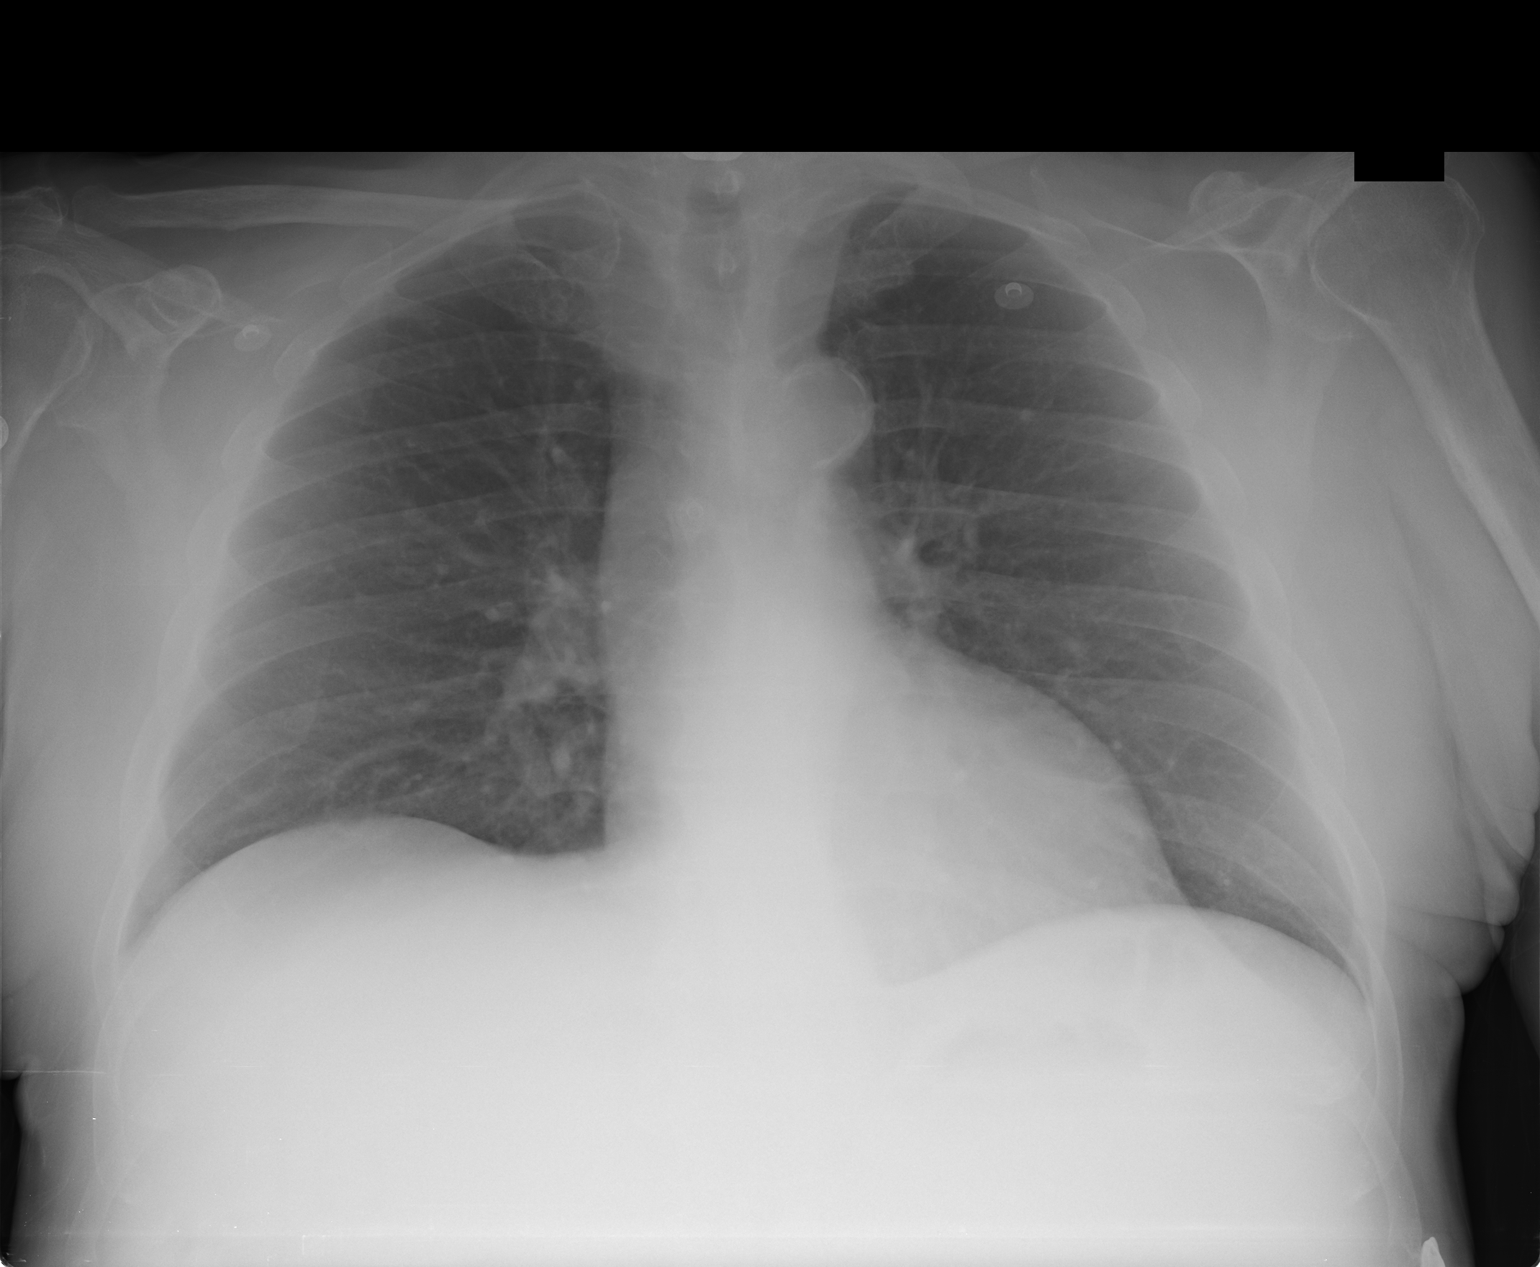

[1 of 1 positions shown; findings below may reference images not displayed]

EXAM

RADIOLOGICAL EXAMINATION, CHEST; SINGLE VIEW, FRONTAL CPT 94949

INDICATION

Patient complains of weakness and dehydration. Patient states history of previous stroke. JC/RG

TECHNIQUE

1 view of the chest was acquired.

COMPARISONS

Previous examination dated 12/18/2018.

FINDINGS

Single AP portable view of the chest shows no focal consolidation, effusions, or pulmonary edema.
Cardiac and mediastinal contours are unremarkable. The aortic arch is calcified. Postsurgical
changes are noted in the cervical spine.

IMPRESSION

No acute cardiopulmonary process. Stable appearance of the chest.

Tech Notes:

PT c/o weakness   dehydration. PT states h/o stroke. JC/RG

## 2021-05-03 IMAGING — CT CT head/brain wo con
3 of 4 series · 14 of 47 positions shown, 16 images · non-contrast
Comparison: none

[Series 4: brain cor 5.00 hr40 s3 · coronal · 0.32mm/px · 3 of 28 slices shown]
[im 10/28  brain]
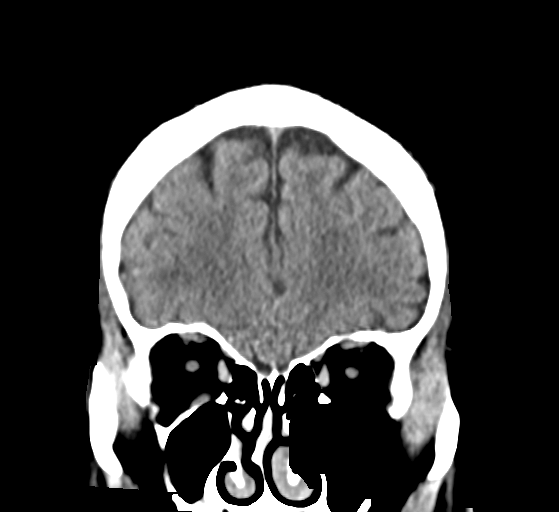
[im 13/28  brain]
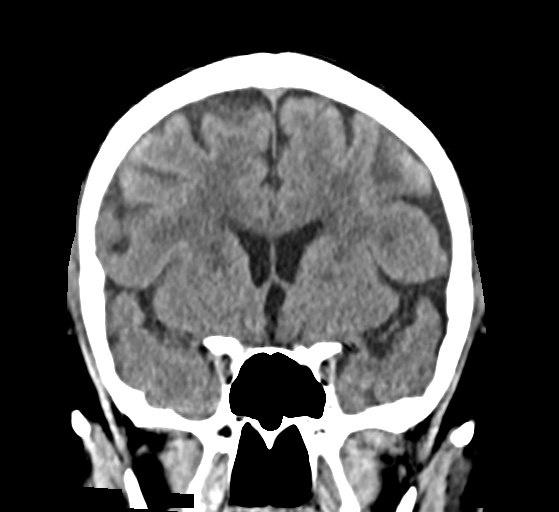
[im 16/28  brain]
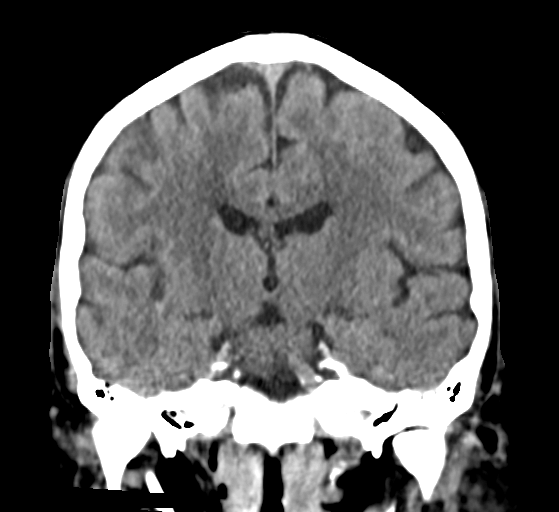

[Series 6: brain sag 5.00 hr40 s3 · sagittal · 0.32mm/px · 3 of 16 slices shown]
[im 6/16  brain]
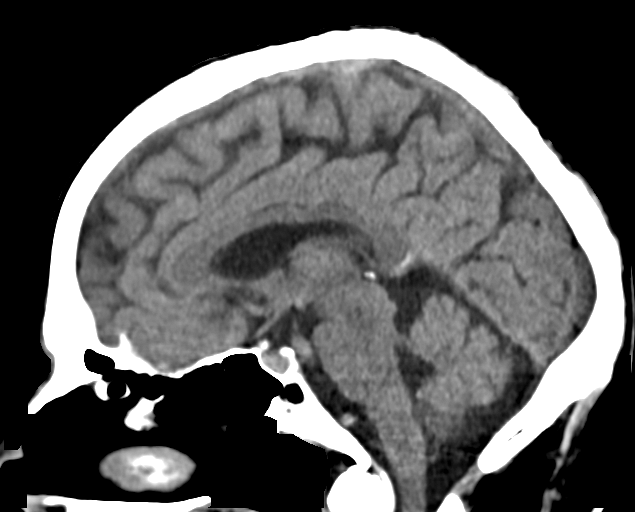
[im 8/16  brain]
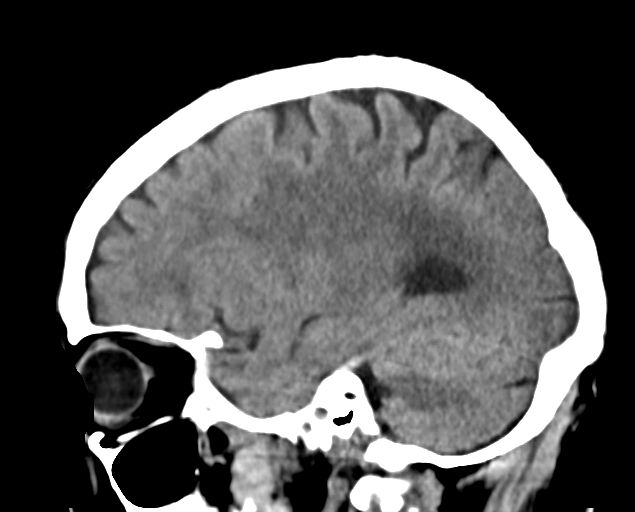
[im 11/16  brain]
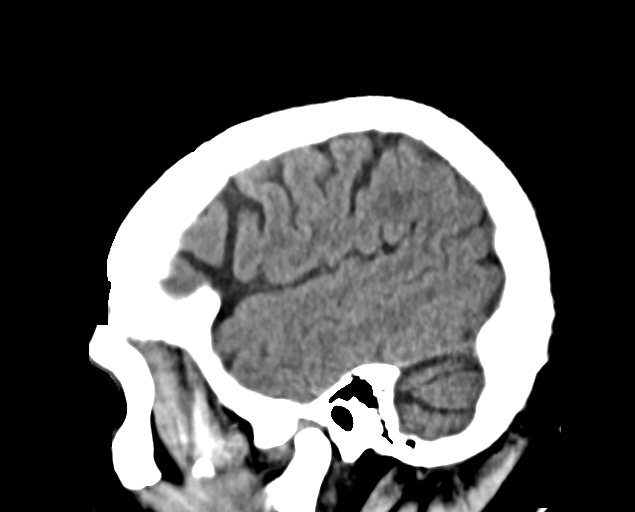

[Series 8: brain ax 5.00 hr60 s3 · axial · 0.35mm/px · z∈[-489,-380]mm · 8 of 24 slices shown, 10 images]
[im 3/24  brain]
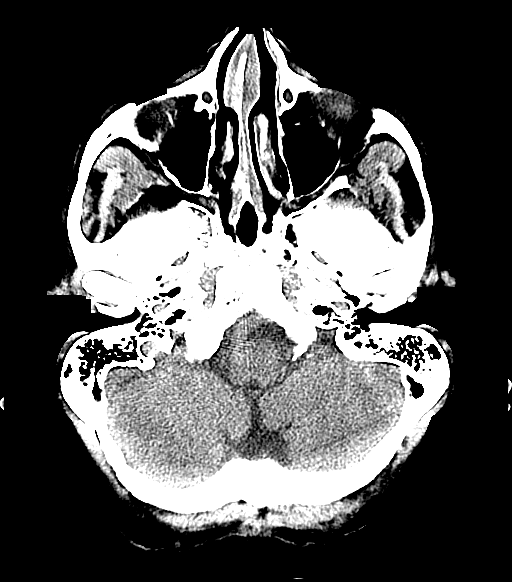
[im 3/24  bone]
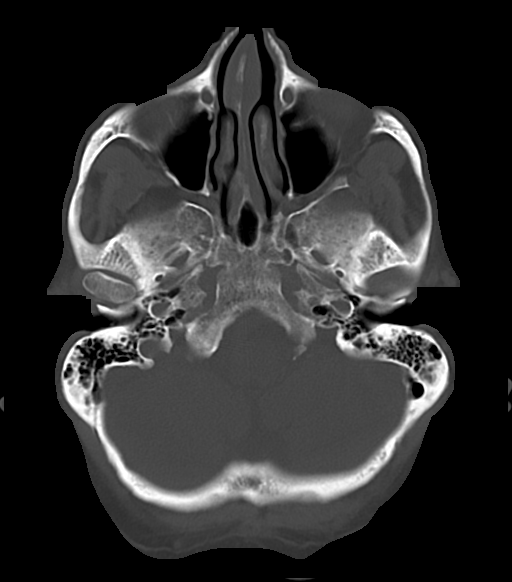
[im 6/24  brain]
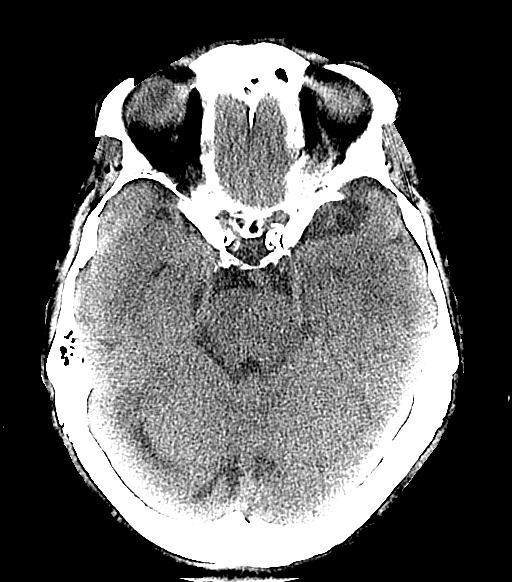
[im 9/24  brain]
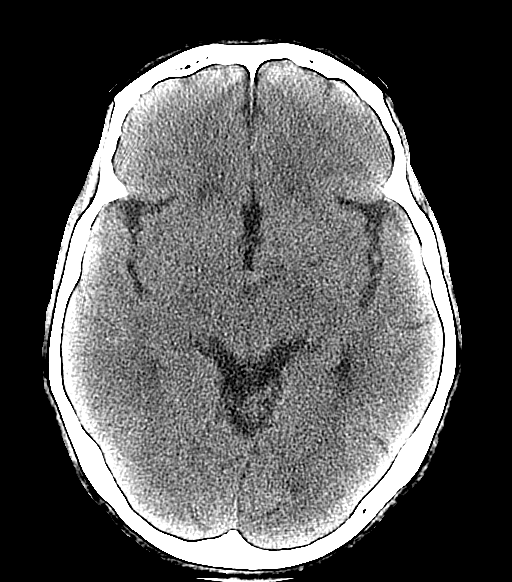
[im 11/24  brain]
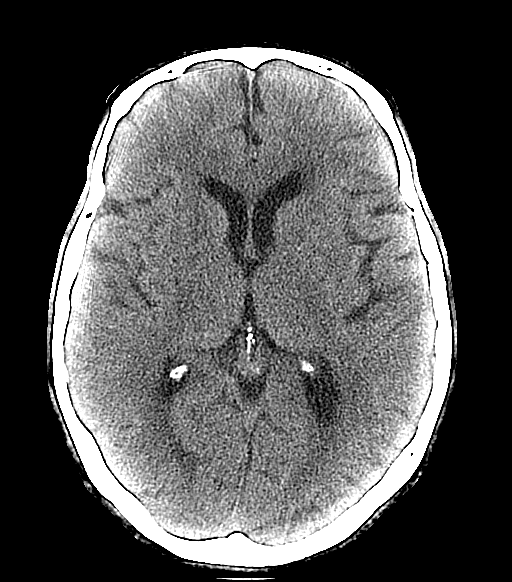
[im 14/24  brain]
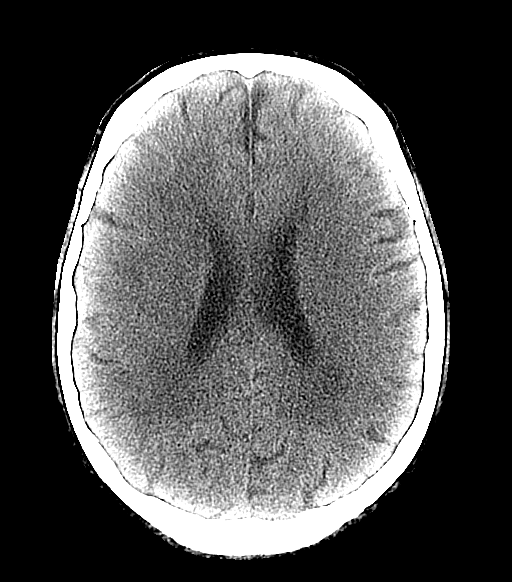
[im 14/24  bone]
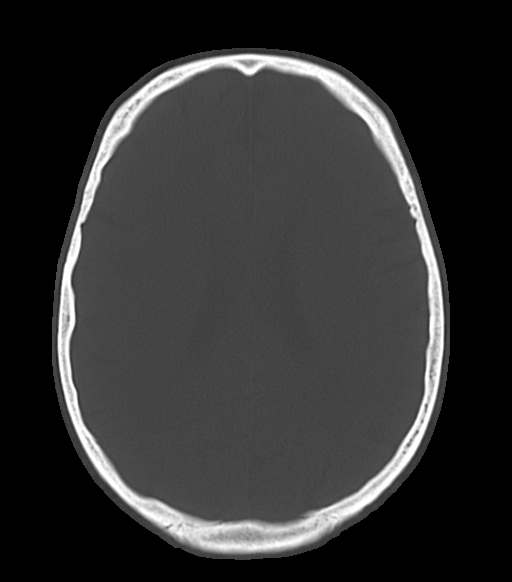
[im 17/24  brain]
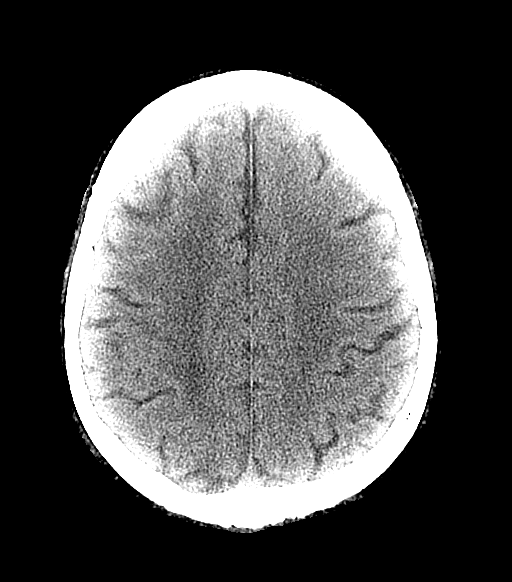
[im 19/24  brain]
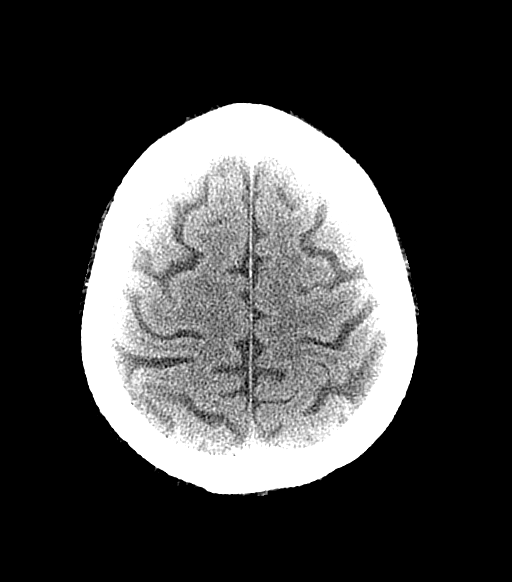
[im 22/24  brain]
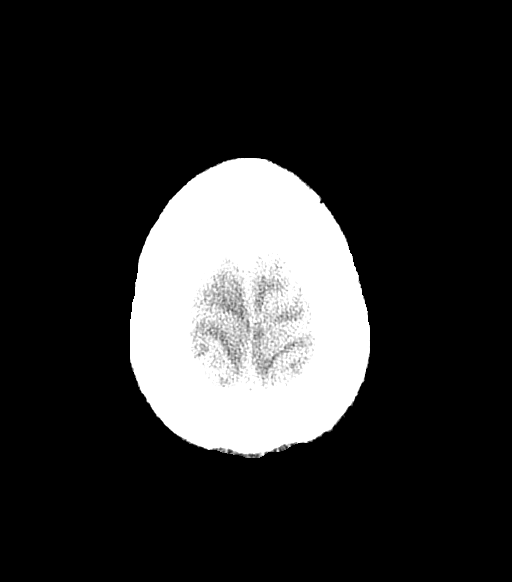

[14 of 47 positions shown; findings below may reference images not displayed]

DIAGNOSTIC STUDIES

EXAM

CT HEAD WITHOUT CONTRAST

INDICATION

possible cva
POSSIBLE CVA. CT/NM 1/0. TJ

TECHNIQUE

Contiguous transaxial imaging from the vertex of the scalp throughout the skull base was performed
in the absence of intravenous iodinated contrast material. Coronal and sagittal reformations were
obtained.

All CT scans at this facility use dose modulation, iterative reconstruction, and/or weight based
dosing when appropriate to reduce radiation dose to as low as reasonably achievable.

COMPARISONS

CT head without contrast December 18, 2018.

Number of previous computed tomography exams in the last 12 months is 1.

Number of previous nuclear medicine myocardial perfusion studies in the last 12 months is 0 .

FINDINGS

The ventricles and subarachnoid spaces are unchanged. Atherosclerotic calcifications distal
internal carotid and vertebral arteries. The basal cisterns are clear. There is no midline shift or
mass effect. No acute intracranial hemorrhage or extraaxial fluid collection is identified. Mild
chronic white matter disease re-demonstrated.

The visualized globes and orbits are unremarkable. No scalp mass is identified. The visualized
paranasal sinuses and mastoid air cells are clear. The calvarium is intact.

IMPRESSION

No acute intracranial abnormality is identified.

Tech Notes:

POSSIBLE CVA. CT/NM 1/0. TJ

## 2021-05-03 IMAGING — CR CHEST
3 series · 3 of 3 positions shown · non-contrast
Comparison: none

[chest pa x-wise]
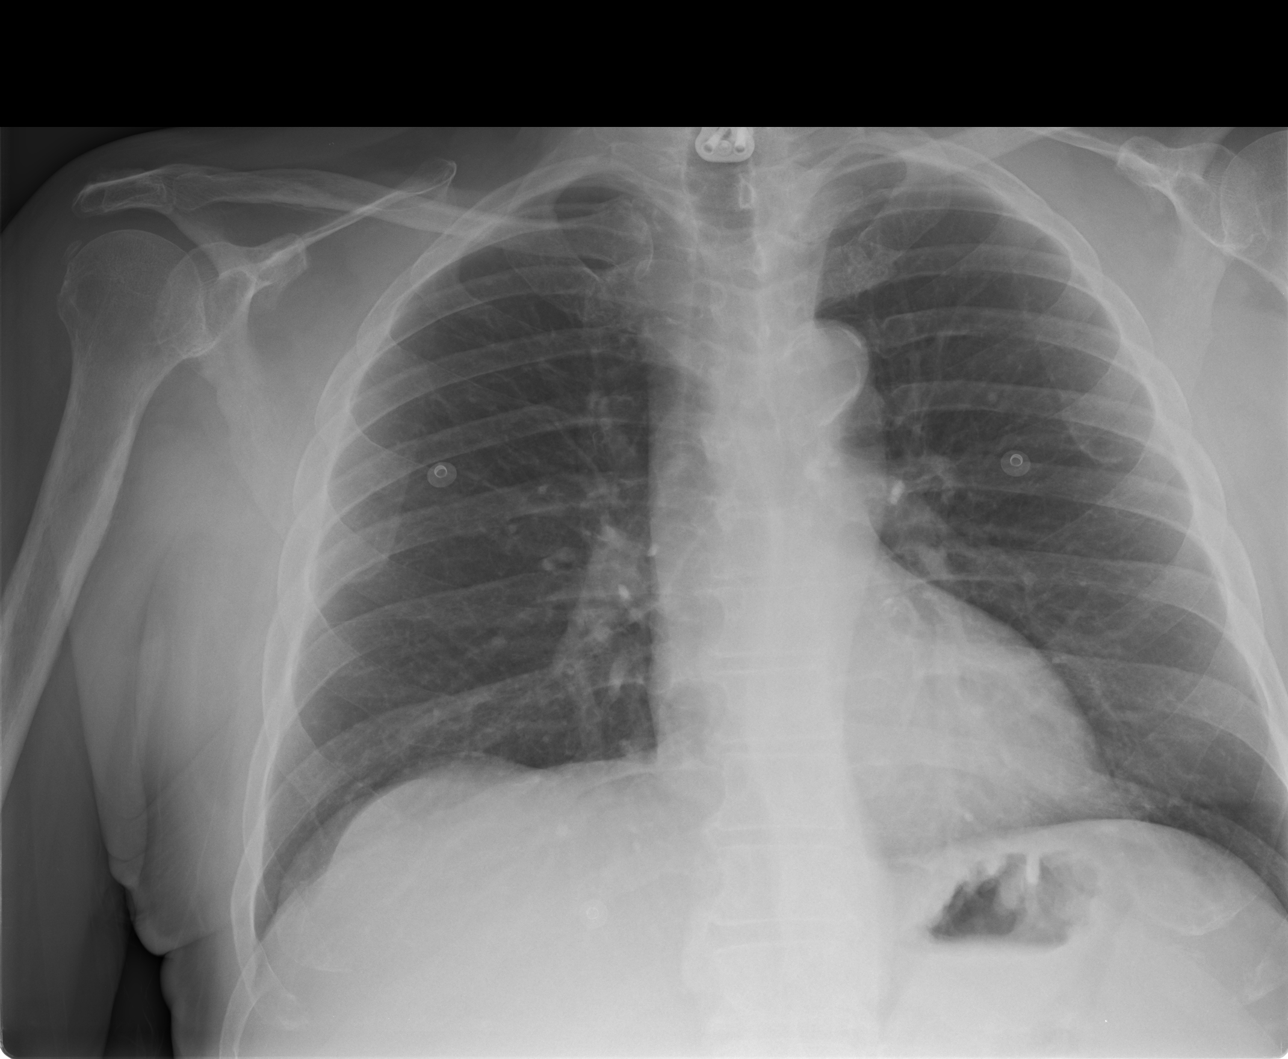

[rib upper]
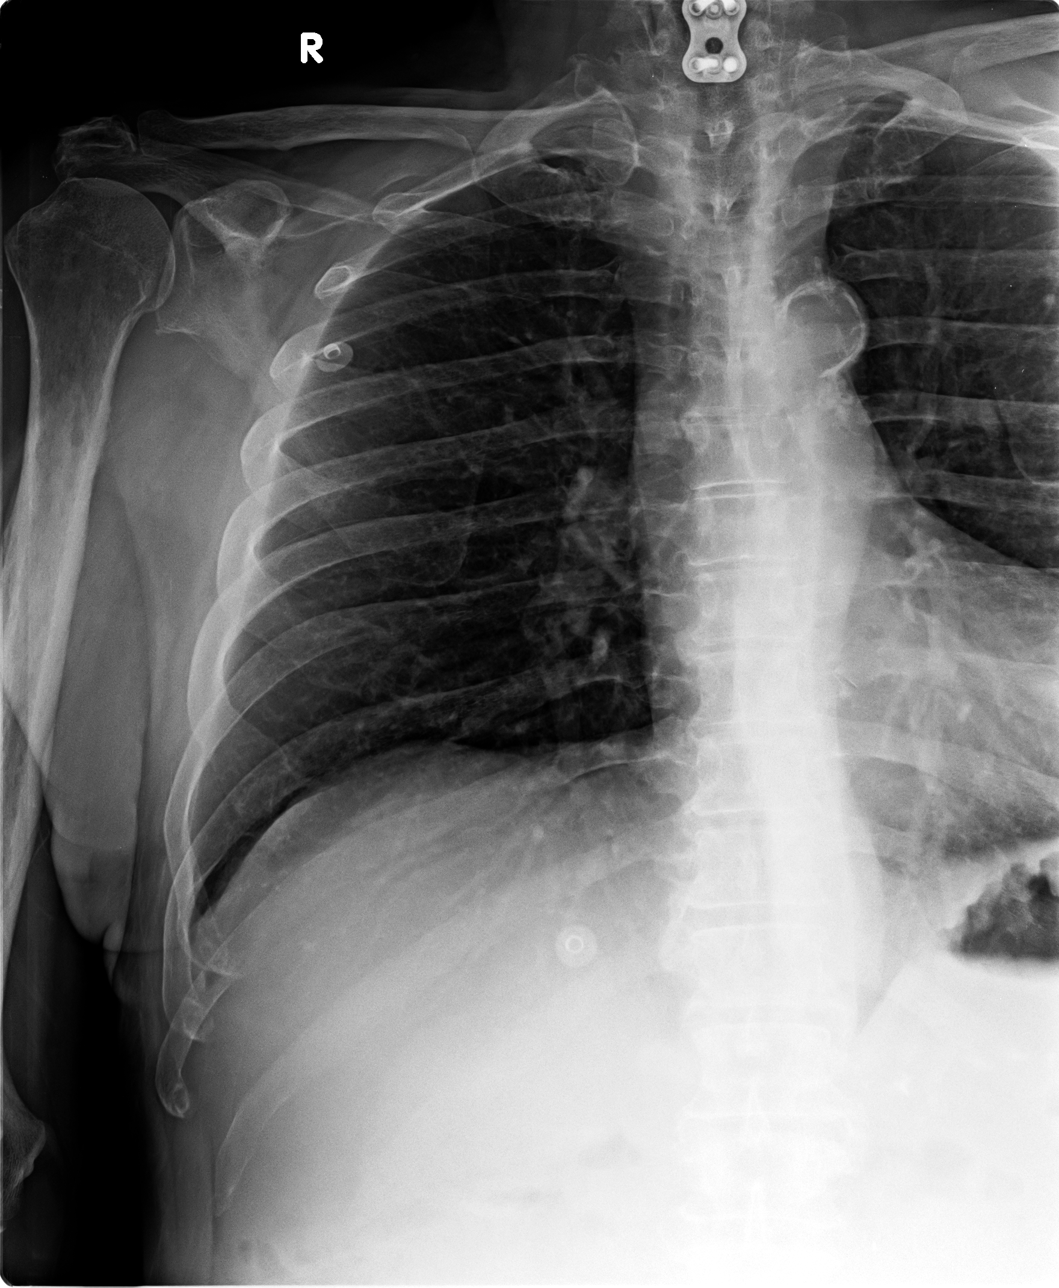

[rib lower]
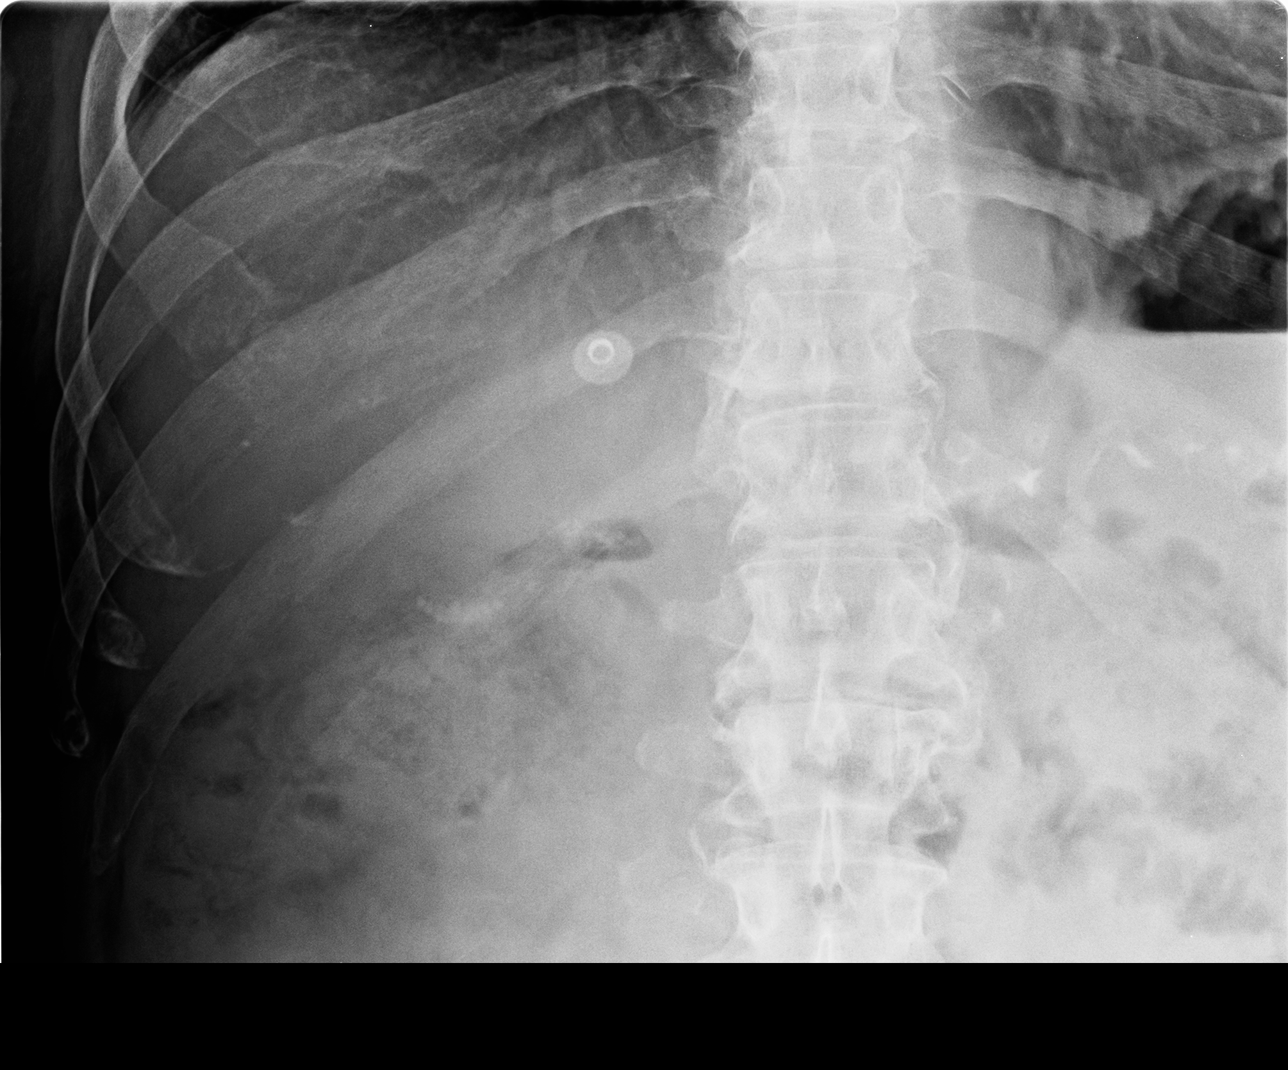

[3 of 3 positions shown; findings below may reference images not displayed]

DIAGNOSTIC STUDIES

EXAM

Three-view right ribs in single-view chest

INDICATION

Right rib pain after a fall

TECHNIQUE

Frontal views of the chest and right ribs

COMPARISONS

Chest radiograph December 30, 2018

FINDINGS

Calcified granulomas are noted. No pulmonary consolidation, pleural effusion, or pneumothorax.
Heart and pulmonary vasculature are within normal limits. ACDF changes are noted. No displaced right
rib fracture is identified.

IMPRESSION

No acute cardiopulmonary abnormality or displaced right rib fracture.

Tech Notes:

FALL THIS AM, RT SIDED RIB PAIN. WEAKNESS
